# Patient Record
Sex: Female | Born: 1970 | Race: White | Hispanic: Yes | Marital: Married | Smoking: Never smoker
Health system: Southern US, Community
[De-identification: ages and names within clinical notes are randomized; demographics above are authoritative.]

## PROBLEM LIST (undated history)

## (undated) DIAGNOSIS — R011 Cardiac murmur, unspecified: Secondary | ICD-10-CM

## (undated) DIAGNOSIS — M5030 Other cervical disc degeneration, unspecified cervical region: Secondary | ICD-10-CM

## (undated) DIAGNOSIS — D219 Benign neoplasm of connective and other soft tissue, unspecified: Secondary | ICD-10-CM

## (undated) DIAGNOSIS — D649 Anemia, unspecified: Secondary | ICD-10-CM

## (undated) DIAGNOSIS — G4730 Sleep apnea, unspecified: Secondary | ICD-10-CM

## (undated) DIAGNOSIS — K219 Gastro-esophageal reflux disease without esophagitis: Secondary | ICD-10-CM

## (undated) DIAGNOSIS — Z803 Family history of malignant neoplasm of breast: Secondary | ICD-10-CM

## (undated) DIAGNOSIS — Z9889 Other specified postprocedural states: Secondary | ICD-10-CM

## (undated) DIAGNOSIS — T8859XA Other complications of anesthesia, initial encounter: Secondary | ICD-10-CM

## (undated) DIAGNOSIS — F419 Anxiety disorder, unspecified: Secondary | ICD-10-CM

## (undated) DIAGNOSIS — I340 Nonrheumatic mitral (valve) insufficiency: Secondary | ICD-10-CM

## (undated) DIAGNOSIS — R112 Nausea with vomiting, unspecified: Secondary | ICD-10-CM

## (undated) DIAGNOSIS — J189 Pneumonia, unspecified organism: Secondary | ICD-10-CM

## (undated) HISTORY — PX: ESOPHAGOGASTRODUODENOSCOPY ENDOSCOPY: SHX5814

## (undated) HISTORY — DX: Benign neoplasm of connective and other soft tissue, unspecified: D21.9

## (undated) HISTORY — DX: Other cervical disc degeneration, unspecified cervical region: M50.30

## (undated) HISTORY — PX: ABDOMINAL HYSTERECTOMY: SHX81

## (undated) HISTORY — PX: COLONOSCOPY: SHX174

## (undated) HISTORY — DX: Nonrheumatic mitral (valve) insufficiency: I34.0

## (undated) HISTORY — DX: Anxiety disorder, unspecified: F41.9

## (undated) HISTORY — DX: Anemia, unspecified: D64.9

## (undated) HISTORY — DX: Cardiac murmur, unspecified: R01.1

## (undated) HISTORY — PX: COLECTOMY: SHX59

## (undated) HISTORY — PX: COLOSTOMY: SHX63

## (undated) HISTORY — DX: Family history of malignant neoplasm of breast: Z80.3

---

## 2007-02-24 HISTORY — PX: TUBAL LIGATION: SHX77

## 2013-10-25 ENCOUNTER — Other Ambulatory Visit: Payer: Self-pay | Admitting: Family Medicine

## 2013-10-25 DIAGNOSIS — N6002 Solitary cyst of left breast: Secondary | ICD-10-CM

## 2013-11-02 ENCOUNTER — Other Ambulatory Visit: Payer: Self-pay

## 2013-11-17 ENCOUNTER — Other Ambulatory Visit: Payer: Self-pay

## 2013-12-01 ENCOUNTER — Other Ambulatory Visit: Payer: Self-pay

## 2013-12-04 ENCOUNTER — Other Ambulatory Visit: Payer: Self-pay | Admitting: Family Medicine

## 2013-12-04 DIAGNOSIS — N6002 Solitary cyst of left breast: Secondary | ICD-10-CM

## 2013-12-15 ENCOUNTER — Other Ambulatory Visit: Payer: Self-pay | Admitting: Family Medicine

## 2013-12-15 ENCOUNTER — Encounter (INDEPENDENT_AMBULATORY_CARE_PROVIDER_SITE_OTHER): Payer: Self-pay

## 2013-12-15 ENCOUNTER — Ambulatory Visit
Admission: RE | Admit: 2013-12-15 | Discharge: 2013-12-15 | Disposition: A | Discharge status: Home / Self Care | Payer: Commercial Indemnity | Source: Ambulatory Visit | Attending: Family Medicine | Admitting: Family Medicine

## 2013-12-15 DIAGNOSIS — N6002 Solitary cyst of left breast: Secondary | ICD-10-CM

## 2014-04-20 ENCOUNTER — Other Ambulatory Visit: Payer: Self-pay | Admitting: Obstetrics & Gynecology

## 2014-04-20 ENCOUNTER — Encounter: Payer: Self-pay | Admitting: Obstetrics & Gynecology

## 2014-04-20 ENCOUNTER — Ambulatory Visit (INDEPENDENT_AMBULATORY_CARE_PROVIDER_SITE_OTHER): Payer: Commercial Indemnity | Admitting: Obstetrics & Gynecology

## 2014-04-20 VITALS — BP 115/74 | HR 93 | Ht 66.0 in | Wt 142.0 lb

## 2014-04-20 DIAGNOSIS — N644 Mastodynia: Secondary | ICD-10-CM | POA: Diagnosis not present

## 2014-04-20 DIAGNOSIS — D259 Leiomyoma of uterus, unspecified: Secondary | ICD-10-CM

## 2014-04-20 DIAGNOSIS — Z1151 Encounter for screening for human papillomavirus (HPV): Secondary | ICD-10-CM

## 2014-04-20 DIAGNOSIS — Z01411 Encounter for gynecological examination (general) (routine) with abnormal findings: Secondary | ICD-10-CM

## 2014-04-20 DIAGNOSIS — Z01419 Encounter for gynecological examination (general) (routine) without abnormal findings: Secondary | ICD-10-CM

## 2014-04-20 DIAGNOSIS — N920 Excessive and frequent menstruation with regular cycle: Secondary | ICD-10-CM

## 2014-04-20 DIAGNOSIS — Z124 Encounter for screening for malignant neoplasm of cervix: Secondary | ICD-10-CM | POA: Diagnosis not present

## 2014-04-20 MED ORDER — TRANEXAMIC ACID 650 MG PO TABS: 1300.0000 mg | ORAL_TABLET | Freq: Three times a day (TID) | ORAL | Status: DC

## 2014-04-20 NOTE — Patient Instructions (Signed)
Preventive Care for Adults A healthy lifestyle and preventive care can promote health and wellness. Preventive health guidelines for women include the following key practices.  A routine yearly physical is a good way to check with your health care provider about your health and preventive screening. It is a chance to share any concerns and updates on your health and to receive a thorough exam.  Visit your dentist for a routine exam and preventive care every 6 months. Brush your teeth twice a day and floss once a day. Good oral hygiene prevents tooth decay and gum disease.  The frequency of eye exams is based on your age, health, family medical history, use of contact lenses, and other factors. Follow your health care provider's recommendations for frequency of eye exams.  Eat a healthy diet. Foods like vegetables, fruits, whole grains, low-fat dairy products, and lean protein foods contain the nutrients you need without too many calories. Decrease your intake of foods high in solid fats, added sugars, and salt. Eat the right amount of calories for you.Get information about a proper diet from your health care provider, if necessary.  Regular physical exercise is one of the most important things you can do for your health. Most adults should get at least 150 minutes of moderate-intensity exercise (any activity that increases your heart rate and causes you to sweat) each week. In addition, most adults need muscle-strengthening exercises on 2 or more days a week.  Maintain a healthy weight. The body mass index (BMI) is a screening tool to identify possible weight problems. It provides an estimate of body fat based on height and weight. Your health care provider can find your BMI and can help you achieve or maintain a healthy weight.For adults 20 years and older:  A BMI below 18.5 is considered underweight.  A BMI of 18.5 to 24.9 is normal.  A BMI of 25 to 29.9 is considered overweight.  A BMI of  30 and above is considered obese.  Maintain normal blood lipids and cholesterol levels by exercising and minimizing your intake of saturated fat. Eat a balanced diet with plenty of fruit and vegetables. Blood tests for lipids and cholesterol should begin at age 20 and be repeated every 5 years. If your lipid or cholesterol levels are high, you are over 50, or you are at high risk for heart disease, you may need your cholesterol levels checked more frequently.Ongoing high lipid and cholesterol levels should be treated with medicines if diet and exercise are not working.  If you smoke, find out from your health care provider how to quit. If you do not use tobacco, do not start.  Lung cancer screening is recommended for adults aged 55-80 years who are at high risk for developing lung cancer because of a history of smoking. A yearly low-dose CT scan of the lungs is recommended for people who have at least a 30-pack-year history of smoking and are a current smoker or have quit within the past 15 years. A pack year of smoking is smoking an average of 1 pack of cigarettes a day for 1 year (for example: 1 pack a day for 30 years or 2 packs a day for 15 years). Yearly screening should continue until the smoker has stopped smoking for at least 15 years. Yearly screening should be stopped for people who develop a health problem that would prevent them from having lung cancer treatment.  If you are pregnant, do not drink alcohol. If you are breastfeeding,   be very cautious about drinking alcohol. If you are not pregnant and choose to drink alcohol, do not have more than 1 drink per day. One drink is considered to be 12 ounces (355 mL) of beer, 5 ounces (148 mL) of wine, or 1.5 ounces (44 mL) of liquor.  Avoid use of street drugs. Do not share needles with anyone. Ask for help if you need support or instructions about stopping the use of drugs.  High blood pressure causes heart disease and increases the risk of  stroke. Your blood pressure should be checked at least every 1 to 2 years. Ongoing high blood pressure should be treated with medicines if weight loss and exercise do not work.  If you are 75-52 years old, ask your health care provider if you should take aspirin to prevent strokes.  Diabetes screening involves taking a blood sample to check your fasting blood sugar level. This should be done once every 3 years, after age 15, if you are within normal weight and without risk factors for diabetes. Testing should be considered at a younger age or be carried out more frequently if you are overweight and have at least 1 risk factor for diabetes.  Breast cancer screening is essential preventive care for women. You should practice "breast self-awareness." This means understanding the normal appearance and feel of your breasts and may include breast self-examination. Any changes detected, no matter how small, should be reported to a health care provider. Women in their 58s and 30s should have a clinical breast exam (CBE) by a health care provider as part of a regular health exam every 1 to 3 years. After age 16, women should have a CBE every year. Starting at age 53, women should consider having a mammogram (breast X-ray test) every year. Women who have a family history of breast cancer should talk to their health care provider about genetic screening. Women at a high risk of breast cancer should talk to their health care providers about having an MRI and a mammogram every year.  Breast cancer gene (BRCA)-related cancer risk assessment is recommended for women who have family members with BRCA-related cancers. BRCA-related cancers include breast, ovarian, tubal, and peritoneal cancers. Having family members with these cancers may be associated with an increased risk for harmful changes (mutations) in the breast cancer genes BRCA1 and BRCA2. Results of the assessment will determine the need for genetic counseling and  BRCA1 and BRCA2 testing.  Routine pelvic exams to screen for cancer are no longer recommended for nonpregnant women who are considered low risk for cancer of the pelvic organs (ovaries, uterus, and vagina) and who do not have symptoms. Ask your health care provider if a screening pelvic exam is right for you.  If you have had past treatment for cervical cancer or a condition that could lead to cancer, you need Pap tests and screening for cancer for at least 20 years after your treatment. If Pap tests have been discontinued, your risk factors (such as having a new sexual partner) need to be reassessed to determine if screening should be resumed. Some women have medical problems that increase the chance of getting cervical cancer. In these cases, your health care provider may recommend more frequent screening and Pap tests.  The HPV test is an additional test that may be used for cervical cancer screening. The HPV test looks for the virus that can cause the cell changes on the cervix. The cells collected during the Pap test can be  tested for HPV. The HPV test could be used to screen women aged 30 years and older, and should be used in women of any age who have unclear Pap test results. After the age of 30, women should have HPV testing at the same frequency as a Pap test.  Colorectal cancer can be detected and often prevented. Most routine colorectal cancer screening begins at the age of 50 years and continues through age 75 years. However, your health care provider may recommend screening at an earlier age if you have risk factors for colon cancer. On a yearly basis, your health care provider may provide home test kits to check for hidden blood in the stool. Use of a small camera at the end of a tube, to directly examine the colon (sigmoidoscopy or colonoscopy), can detect the earliest forms of colorectal cancer. Talk to your health care provider about this at age 50, when routine screening begins. Direct  exam of the colon should be repeated every 5-10 years through age 75 years, unless early forms of pre-cancerous polyps or small growths are found.  People who are at an increased risk for hepatitis B should be screened for this virus. You are considered at high risk for hepatitis B if:  You were born in a country where hepatitis B occurs often. Talk with your health care provider about which countries are considered high risk.  Your parents were born in a high-risk country and you have not received a shot to protect against hepatitis B (hepatitis B vaccine).  You have HIV or AIDS.  You use needles to inject street drugs.  You live with, or have sex with, someone who has hepatitis B.  You get hemodialysis treatment.  You take certain medicines for conditions like cancer, organ transplantation, and autoimmune conditions.  Hepatitis C blood testing is recommended for all people born from 1945 through 1965 and any individual with known risks for hepatitis C.  Practice safe sex. Use condoms and avoid high-risk sexual practices to reduce the spread of sexually transmitted infections (STIs). STIs include gonorrhea, chlamydia, syphilis, trichomonas, herpes, HPV, and human immunodeficiency virus (HIV). Herpes, HIV, and HPV are viral illnesses that have no cure. They can result in disability, cancer, and death.  You should be screened for sexually transmitted illnesses (STIs) including gonorrhea and chlamydia if:  You are sexually active and are younger than 24 years.  You are older than 24 years and your health care provider tells you that you are at risk for this type of infection.  Your sexual activity has changed since you were last screened and you are at an increased risk for chlamydia or gonorrhea. Ask your health care provider if you are at risk.  If you are at risk of being infected with HIV, it is recommended that you take a prescription medicine daily to prevent HIV infection. This is  called preexposure prophylaxis (PrEP). You are considered at risk if:  You are a heterosexual woman, are sexually active, and are at increased risk for HIV infection.  You take drugs by injection.  You are sexually active with a partner who has HIV.  Talk with your health care provider about whether you are at high risk of being infected with HIV. If you choose to begin PrEP, you should first be tested for HIV. You should then be tested every 3 months for as long as you are taking PrEP.  Osteoporosis is a disease in which the bones lose minerals and strength   with aging. This can result in serious bone fractures or breaks. The risk of osteoporosis can be identified using a bone density scan. Women ages 65 years and over and women at risk for fractures or osteoporosis should discuss screening with their health care providers. Ask your health care provider whether you should take a calcium supplement or vitamin D to reduce the rate of osteoporosis.  Menopause can be associated with physical symptoms and risks. Hormone replacement therapy is available to decrease symptoms and risks. You should talk to your health care provider about whether hormone replacement therapy is right for you.  Use sunscreen. Apply sunscreen liberally and repeatedly throughout the day. You should seek shade when your shadow is shorter than you. Protect yourself by wearing long sleeves, pants, a wide-brimmed hat, and sunglasses year round, whenever you are outdoors.  Once a month, do a whole body skin exam, using a mirror to look at the skin on your back. Tell your health care provider of new moles, moles that have irregular borders, moles that are larger than a pencil eraser, or moles that have changed in shape or color.  Stay current with required vaccines (immunizations).  Influenza vaccine. All adults should be immunized every year.  Tetanus, diphtheria, and acellular pertussis (Td, Tdap) vaccine. Pregnant women should  receive 1 dose of Tdap vaccine during each pregnancy. The dose should be obtained regardless of the length of time since the last dose. Immunization is preferred during the 27th-36th week of gestation. An adult who has not previously received Tdap or who does not know her vaccine status should receive 1 dose of Tdap. This initial dose should be followed by tetanus and diphtheria toxoids (Td) booster doses every 10 years. Adults with an unknown or incomplete history of completing a 3-dose immunization series with Td-containing vaccines should begin or complete a primary immunization series including a Tdap dose. Adults should receive a Td booster every 10 years.  Varicella vaccine. An adult without evidence of immunity to varicella should receive 2 doses or a second dose if she has previously received 1 dose. Pregnant females who do not have evidence of immunity should receive the first dose after pregnancy. This first dose should be obtained before leaving the health care facility. The second dose should be obtained 4-8 weeks after the first dose.  Human papillomavirus (HPV) vaccine. Females aged 13-26 years who have not received the vaccine previously should obtain the 3-dose series. The vaccine is not recommended for use in pregnant females. However, pregnancy testing is not needed before receiving a dose. If a female is found to be pregnant after receiving a dose, no treatment is needed. In that case, the remaining doses should be delayed until after the pregnancy. Immunization is recommended for any person with an immunocompromised condition through the age of 26 years if she did not get any or all doses earlier. During the 3-dose series, the second dose should be obtained 4-8 weeks after the first dose. The third dose should be obtained 24 weeks after the first dose and 16 weeks after the second dose.  Zoster vaccine. One dose is recommended for adults aged 60 years or older unless certain conditions are  present.  Measles, mumps, and rubella (MMR) vaccine. Adults born before 1957 generally are considered immune to measles and mumps. Adults born in 1957 or later should have 1 or more doses of MMR vaccine unless there is a contraindication to the vaccine or there is laboratory evidence of immunity to   each of the three diseases. A routine second dose of MMR vaccine should be obtained at least 28 days after the first dose for students attending postsecondary schools, health care workers, or international travelers. People who received inactivated measles vaccine or an unknown type of measles vaccine during 1963-1967 should receive 2 doses of MMR vaccine. People who received inactivated mumps vaccine or an unknown type of mumps vaccine before 1979 and are at high risk for mumps infection should consider immunization with 2 doses of MMR vaccine. For females of childbearing age, rubella immunity should be determined. If there is no evidence of immunity, females who are not pregnant should be vaccinated. If there is no evidence of immunity, females who are pregnant should delay immunization until after pregnancy. Unvaccinated health care workers born before 1957 who lack laboratory evidence of measles, mumps, or rubella immunity or laboratory confirmation of disease should consider measles and mumps immunization with 2 doses of MMR vaccine or rubella immunization with 1 dose of MMR vaccine.  Pneumococcal 13-valent conjugate (PCV13) vaccine. When indicated, a person who is uncertain of her immunization history and has no record of immunization should receive the PCV13 vaccine. An adult aged 19 years or older who has certain medical conditions and has not been previously immunized should receive 1 dose of PCV13 vaccine. This PCV13 should be followed with a dose of pneumococcal polysaccharide (PPSV23) vaccine. The PPSV23 vaccine dose should be obtained at least 8 weeks after the dose of PCV13 vaccine. An adult aged 19  years or older who has certain medical conditions and previously received 1 or more doses of PPSV23 vaccine should receive 1 dose of PCV13. The PCV13 vaccine dose should be obtained 1 or more years after the last PPSV23 vaccine dose.  Pneumococcal polysaccharide (PPSV23) vaccine. When PCV13 is also indicated, PCV13 should be obtained first. All adults aged 65 years and older should be immunized. An adult younger than age 65 years who has certain medical conditions should be immunized. Any person who resides in a nursing home or long-term care facility should be immunized. An adult smoker should be immunized. People with an immunocompromised condition and certain other conditions should receive both PCV13 and PPSV23 vaccines. People with human immunodeficiency virus (HIV) infection should be immunized as soon as possible after diagnosis. Immunization during chemotherapy or radiation therapy should be avoided. Routine use of PPSV23 vaccine is not recommended for American Indians, [place] Natives, or people younger than 65 years unless there are medical conditions that require PPSV23 vaccine. When indicated, people who have unknown immunization and have no record of immunization should receive PPSV23 vaccine. One-time revaccination 5 years after the first dose of PPSV23 is recommended for people aged 19-64 years who have chronic kidney failure, nephrotic syndrome, asplenia, or immunocompromised conditions. People who received 1-2 doses of PPSV23 before age 65 years should receive another dose of PPSV23 vaccine at age 65 years or later if at least 5 years have passed since the previous dose. Doses of PPSV23 are not needed for people immunized with PPSV23 at or after age 65 years.  Meningococcal vaccine. Adults with asplenia or persistent complement component deficiencies should receive 2 doses of quadrivalent meningococcal conjugate (MenACWY-D) vaccine. The doses should be obtained at least 2 months apart.  Microbiologists working with certain meningococcal bacteria, military recruits, people at risk during an outbreak, and people who travel to or live in countries with a high rate of meningitis should be immunized. A first-year college student up through age   21 years who is living in a residence hall should receive a dose if she did not receive a dose on or after her 16th birthday. Adults who have certain high-risk conditions should receive one or more doses of vaccine.  Hepatitis A vaccine. Adults who wish to be protected from this disease, have certain high-risk conditions, work with hepatitis A-infected animals, work in hepatitis A research labs, or travel to or work in countries with a high rate of hepatitis A should be immunized. Adults who were previously unvaccinated and who anticipate close contact with an international adoptee during the first 60 days after arrival in the [place] from a country with a high rate of hepatitis A should be immunized.  Hepatitis B vaccine. Adults who wish to be protected from this disease, have certain high-risk conditions, may be exposed to blood or other infectious body fluids, are household contacts or sex partners of hepatitis B positive people, are clients or workers in certain care facilities, or travel to or work in countries with a high rate of hepatitis B should be immunized.  Haemophilus influenzae type b (Hib) vaccine. A previously unvaccinated person with asplenia or sickle cell disease or having a scheduled splenectomy should receive 1 dose of Hib vaccine. Regardless of previous immunization, a recipient of a hematopoietic stem cell transplant should receive a 3-dose series 6-12 months after her successful transplant. Hib vaccine is not recommended for adults with HIV infection. Preventive Services / Frequency Ages 64 to 68 years  Blood pressure check.** / Every 1 to 2 years.  Lipid and cholesterol check.** / Every 5 years beginning at age  22.  Clinical breast exam.** / Every 3 years for women in their 88s and 53s.  BRCA-related cancer risk assessment.** / For women who have family members with a BRCA-related cancer (breast, ovarian, tubal, or peritoneal cancers).  Pap test.** / Every 2 years from ages 90 through 51. Every 3 years starting at age 21 through age 56 or 3 with a history of 3 consecutive normal Pap tests.  HPV screening.** / Every 3 years from ages 24 through ages 1 to 46 with a history of 3 consecutive normal Pap tests.  Hepatitis C blood test.** / For any individual with known risks for hepatitis C.  Skin self-exam. / Monthly.  Influenza vaccine. / Every year.  Tetanus, diphtheria, and acellular pertussis (Tdap, Td) vaccine.** / Consult your health care provider. Pregnant women should receive 1 dose of Tdap vaccine during each pregnancy. 1 dose of Td every 10 years.  Varicella vaccine.** / Consult your health care provider. Pregnant females who do not have evidence of immunity should receive the first dose after pregnancy.  HPV vaccine. / 3 doses over 6 months, if 72 and younger. The vaccine is not recommended for use in pregnant females. However, pregnancy testing is not needed before receiving a dose.  Measles, mumps, rubella (MMR) vaccine.** / You need at least 1 dose of MMR if you were born in 1957 or later. You may also need a 2nd dose. For females of childbearing age, rubella immunity should be determined. If there is no evidence of immunity, females who are not pregnant should be vaccinated. If there is no evidence of immunity, females who are pregnant should delay immunization until after pregnancy.  Pneumococcal 13-valent conjugate (PCV13) vaccine.** / Consult your health care provider.  Pneumococcal polysaccharide (PPSV23) vaccine.** / 1 to 2 doses if you smoke cigarettes or if you have certain conditions.  Meningococcal vaccine.** /  1 dose if you are age 19 to 21 years and a first-year college  student living in a residence hall, or have one of several medical conditions, you need to get vaccinated against meningococcal disease. You may also need additional booster doses.  Hepatitis A vaccine.** / Consult your health care provider.  Hepatitis B vaccine.** / Consult your health care provider.  Haemophilus influenzae type b (Hib) vaccine.** / Consult your health care provider. Ages 40 to 64 years  Blood pressure check.** / Every 1 to 2 years.  Lipid and cholesterol check.** / Every 5 years beginning at age 20 years.  Lung cancer screening. / Every year if you are aged 55-80 years and have a 30-pack-year history of smoking and currently smoke or have quit within the past 15 years. Yearly screening is stopped once you have quit smoking for at least 15 years or develop a health problem that would prevent you from having lung cancer treatment.  Clinical breast exam.** / Every year after age 40 years.  BRCA-related cancer risk assessment.** / For women who have family members with a BRCA-related cancer (breast, ovarian, tubal, or peritoneal cancers).  Mammogram.** / Every year beginning at age 40 years and continuing for as long as you are in good health. Consult with your health care provider.  Pap test.** / Every 3 years starting at age 30 years through age 65 or 70 years with a history of 3 consecutive normal Pap tests.  HPV screening.** / Every 3 years from ages 30 years through ages 65 to 70 years with a history of 3 consecutive normal Pap tests.  Fecal occult blood test (FOBT) of stool. / Every year beginning at age 50 years and continuing until age 75 years. You may not need to do this test if you get a colonoscopy every 10 years.  Flexible sigmoidoscopy or colonoscopy.** / Every 5 years for a flexible sigmoidoscopy or every 10 years for a colonoscopy beginning at age 50 years and continuing until age 75 years.  Hepatitis C blood test.** / For all people born from 1945 through  1965 and any individual with known risks for hepatitis C.  Skin self-exam. / Monthly.  Influenza vaccine. / Every year.  Tetanus, diphtheria, and acellular pertussis (Tdap/Td) vaccine.** / Consult your health care provider. Pregnant women should receive 1 dose of Tdap vaccine during each pregnancy. 1 dose of Td every 10 years.  Varicella vaccine.** / Consult your health care provider. Pregnant females who do not have evidence of immunity should receive the first dose after pregnancy.  Zoster vaccine.** / 1 dose for adults aged 60 years or older.  Measles, mumps, rubella (MMR) vaccine.** / You need at least 1 dose of MMR if you were born in 1957 or later. You may also need a 2nd dose. For females of childbearing age, rubella immunity should be determined. If there is no evidence of immunity, females who are not pregnant should be vaccinated. If there is no evidence of immunity, females who are pregnant should delay immunization until after pregnancy.  Pneumococcal 13-valent conjugate (PCV13) vaccine.** / Consult your health care provider.  Pneumococcal polysaccharide (PPSV23) vaccine.** / 1 to 2 doses if you smoke cigarettes or if you have certain conditions.  Meningococcal vaccine.** / Consult your health care provider.  Hepatitis A vaccine.** / Consult your health care provider.  Hepatitis B vaccine.** / Consult your health care provider.  Haemophilus influenzae type b (Hib) vaccine.** / Consult your health care provider. Ages 65   years and over  Blood pressure check.** / Every 1 to 2 years.  Lipid and cholesterol check.** / Every 5 years beginning at age 48 years.  Lung cancer screening. / Every year if you are aged 68-80 years and have a 30-pack-year history of smoking and currently smoke or have quit within the past 15 years. Yearly screening is stopped once you have quit smoking for at least 15 years or develop a health problem that would prevent you from having lung cancer  treatment.  Clinical breast exam.** / Every year after age 4 years.  BRCA-related cancer risk assessment.** / For women who have family members with a BRCA-related cancer (breast, ovarian, tubal, or peritoneal cancers).  Mammogram.** / Every year beginning at age 69 years and continuing for as long as you are in good health. Consult with your health care provider.  Pap test.** / Every 3 years starting at age 33 years through age 32 or 70 years with 3 consecutive normal Pap tests. Testing can be stopped between 65 and 70 years with 3 consecutive normal Pap tests and no abnormal Pap or HPV tests in the past 10 years.  HPV screening.** / Every 3 years from ages 32 years through ages 1 or 33 years with a history of 3 consecutive normal Pap tests. Testing can be stopped between 65 and 70 years with 3 consecutive normal Pap tests and no abnormal Pap or HPV tests in the past 10 years.  Fecal occult blood test (FOBT) of stool. / Every year beginning at age 77 years and continuing until age 27 years. You may not need to do this test if you get a colonoscopy every 10 years.  Flexible sigmoidoscopy or colonoscopy.** / Every 5 years for a flexible sigmoidoscopy or every 10 years for a colonoscopy beginning at age 61 years and continuing until age 68 years.  Hepatitis C blood test.** / For all people born from [birth year] through 1965 and any individual with known risks for hepatitis C.  Osteoporosis screening.** / A one-time screening for women ages 73 years and over and women at risk for fractures or osteoporosis.  Skin self-exam. / Monthly.  Influenza vaccine. / Every year.  Tetanus, diphtheria, and acellular pertussis (Tdap/Td) vaccine.** / 1 dose of Td every 10 years.  Varicella vaccine.** / Consult your health care provider.  Zoster vaccine.** / 1 dose for adults aged 67 years or older.  Pneumococcal 13-valent conjugate (PCV13) vaccine.** / Consult your health care provider.  Pneumococcal  polysaccharide (PPSV23) vaccine.** / 1 dose for all adults aged 43 years and older.  Meningococcal vaccine.** / Consult your health care provider.  Hepatitis A vaccine.** / Consult your health care provider.  Hepatitis B vaccine.** / Consult your health care provider.  Haemophilus influenzae type b (Hib) vaccine.** / Consult your health care provider. ** Family history and personal history of risk and conditions may change your health care provider's recommendations. Document Released: 04/07/2001 Document Revised: 06/26/2013 Document Reviewed: 07/07/2010 [facility] Patient Information 2015 [place], [place]. This information is not intended to replace advice given to you by your health care provider. Make sure you discuss any questions you have with your health care provider.  Thank you for enrolling in [facility]. Please follow the instructions below to securely access your online medical record. MyChart allows you to send messages to your doctor, view your test results, manage appointments, and more.   How Do I Sign Up? 1. In your [employer], go to [employer] and  enter [email or web address]. 2. Click on the Sign Up Now link in the Sign In box. You will see the New Member Sign Up page. 3. Enter your MyChart Access Code exactly as it appears below. You will not need to use this code after you've completed the sign-up process. If you do not sign up before the expiration date, you must request a new code.  MyChart Access Code: [email or web address] Expires: 06/19/2014  8:20 AM  4. Enter your Social Security Number ([email or web address]) and Date of Birth (mm/dd/yyyy) as indicated and click Submit. You will be taken to the next sign-up page. 5. Create a MyChart ID. This will be your MyChart login ID and cannot be changed, so think of one that is secure and easy to remember. 6. Create a MyChart password. You can change your password at any time. 7. Enter your Password Reset Question and Answer.  This can be used at a later time if you forget your password.  8. Enter your e-mail address. You will receive e-mail notification when new information is available in [place]. 9. Click Sign Up. You can now view your medical record.   Additional Information Remember, MyChart is NOT to be used for urgent needs. For medical emergencies, dial 911.

## 2014-04-20 NOTE — Progress Notes (Signed)
    GYNECOLOGY CLINIC ANNUAL PREVENTATIVE CARE ENCOUNTER NOTE  Subjective:     Joanna Yang is a 44 y.o. G78P2012 female here for a routine annual gynecologic exam.  Current complaints: left breast pain and lump, also menorrhagia and fibroids. Desires left breast ultrasound and pelvic ultrasound.     Gynecologic History Patient's last menstrual period was 04/10/2014. Contraception: none Last Pap: 06/20/2013. Results were: normal Last mammogram: 12/15/13. Results were: normal.  Had multiple diagnostic breast studies in the past for benign findings; she is very anxious about breast pathology.  No FH of breast cancer.  Obstetric History OB History  Gravida Para Term Preterm AB SAB TAB Ectopic Multiple Living  3 2 2  1 1    2     # Outcome Date GA Lbr Len/2nd Weight Sex Delivery Anes PTL Lv  3 Term 08/31/07   9 lb 7 oz (4.281 kg) M CS-LTranv   Y  2 Term 06/27/03   8 lb 14 oz (4.026 kg) M Vag-Spont   Y  1 SAB               History   Social History  . Marital Status: Married    Spouse Name: N/A  . Number of Children: N/A  . Years of Education: N/A   Occupational History  . Not on file.   Social History Main Topics  . Smoking status: Never Smoker   . Smokeless tobacco: Never Used  . Alcohol Use: 0.0 oz/week    0 Standard drinks or equivalent per week     Comment: rare  . Drug Use: No  . Sexual Activity:    Partners: Male    Birth Control/ Protection: Surgical   Other Topics Concern  . Not on file   Social History Narrative  . No narrative on file    The following portions of the patient's history were reviewed and updated as appropriate: allergies, current medications, past family history, past medical history, past social history, past surgical history and problem list.  Review of Systems Pertinent items are noted in HPI.   Objective:   BP 115/74 mmHg  Pulse 93  Ht 5\' 6"  (1.676 m)  Wt 142 lb (64.411 kg)  BMI 22.93 kg/m2  LMP 04/10/2014 GENERAL:  Well-developed, well-nourished female in no acute distress.  HEENT: Normocephalic, atraumatic. Sclerae anicteric.  NECK: Supple. Normal thyroid.  LUNGS: Clear to auscultation bilaterally.  HEART: Regular rate and rhythm. BREASTS: Symmetric in size. No abnormal masses, skin changes, nipple drainage, or lymphadenopathy. +Tenderness on palpation of left breast. ABDOMEN: Soft, nontender, nondistended. No organomegaly. PELVIC: Normal external female genitalia. Vagina is pink and rugated.  Normal discharge. Normal cervix contour. Pap smear obtained. Uterus is enlarged about 14 week size. No adnexal mass or tenderness.  EXTREMITIES: No cyanosis, clubbing, or edema, 2+ distal pulses.   Assessment:   Annual gynecologic examination Left breast pain Menorrhagia Reported history of fibroids   Plan:   Pap done, will follow up results and manage accordingly. Mammogram is up to date; but ultrasound ordered as per patient request. Lysteda ordered prn menorrhagia (patient wants to avoid hormones). Will follow up ultrasound. Routine preventative health maintenance measures emphasized   Verita Schneiders, MD, [facility]

## 2014-04-23 ENCOUNTER — Other Ambulatory Visit: Payer: Commercial Indemnity

## 2014-04-24 LAB — CYTOLOGY - PAP

## 2014-04-25 ENCOUNTER — Encounter: Payer: Self-pay | Admitting: Obstetrics & Gynecology

## 2014-05-03 ENCOUNTER — Encounter: Payer: Self-pay | Admitting: Obstetrics & Gynecology

## 2014-05-04 ENCOUNTER — Ambulatory Visit (HOSPITAL_COMMUNITY): Payer: Commercial Indemnity

## 2014-06-21 ENCOUNTER — Encounter: Payer: Self-pay | Admitting: Obstetrics & Gynecology

## 2014-09-03 ENCOUNTER — Encounter: Payer: Self-pay | Admitting: *Deleted

## 2014-09-03 DIAGNOSIS — N6002 Solitary cyst of left breast: Secondary | ICD-10-CM

## 2014-11-30 ENCOUNTER — Ambulatory Visit (HOSPITAL_COMMUNITY)
Admission: RE | Admit: 2014-11-30 | Discharge: 2014-11-30 | Disposition: A | Discharge status: Home / Self Care | Payer: Commercial Indemnity | Source: Ambulatory Visit | Attending: Obstetrics & Gynecology | Admitting: Obstetrics & Gynecology

## 2014-11-30 DIAGNOSIS — D259 Leiomyoma of uterus, unspecified: Secondary | ICD-10-CM

## 2014-11-30 DIAGNOSIS — N920 Excessive and frequent menstruation with regular cycle: Secondary | ICD-10-CM | POA: Diagnosis not present

## 2014-11-30 DIAGNOSIS — D649 Anemia, unspecified: Secondary | ICD-10-CM | POA: Insufficient documentation

## 2015-03-08 ENCOUNTER — Telehealth: Payer: Self-pay | Admitting: Genetic Counselor

## 2015-03-08 NOTE — Telephone Encounter (Signed)
Had a long conversation about genetic testing.  Discussed that patient does not have a family history of cancer and therefore her insurance would not cover testing.  Discussed out of pocket options. Also discussed that testing may not give the reassuance she is looking for, if she is negative it does not mean that she will never develop cancer and she could test positive for a VUS which may increase her anxiety.  She will think about testing and call back if she wants to come in.

## 2015-03-22 ENCOUNTER — Telehealth: Payer: Self-pay | Admitting: Genetic Counselor

## 2015-03-22 NOTE — Telephone Encounter (Signed)
Pt spoke with Florene Glen and needs time to think about being tested.  Referring office called to follow up on referral .

## 2015-09-20 ENCOUNTER — Encounter: Payer: Self-pay | Admitting: Obstetrics and Gynecology

## 2015-09-20 ENCOUNTER — Ambulatory Visit (INDEPENDENT_AMBULATORY_CARE_PROVIDER_SITE_OTHER): Payer: Managed Care, Other (non HMO) | Admitting: Obstetrics and Gynecology

## 2015-09-20 VITALS — BP 100/64 | HR 84 | Resp 16 | Ht 65.5 in | Wt 143.0 lb

## 2015-09-20 DIAGNOSIS — N6452 Nipple discharge: Secondary | ICD-10-CM | POA: Diagnosis not present

## 2015-09-20 DIAGNOSIS — R5383 Other fatigue: Secondary | ICD-10-CM

## 2015-09-20 DIAGNOSIS — Z01419 Encounter for gynecological examination (general) (routine) without abnormal findings: Secondary | ICD-10-CM

## 2015-09-20 DIAGNOSIS — F411 Generalized anxiety disorder: Secondary | ICD-10-CM

## 2015-09-20 LAB — COMPREHENSIVE METABOLIC PANEL
ALK PHOS: 35 U/L (ref 33–115)
ALT: 12 U/L (ref 6–29)
AST: 13 U/L (ref 10–30)
Albumin: 4.5 g/dL (ref 3.6–5.1)
BILIRUBIN TOTAL: 0.6 mg/dL (ref 0.2–1.2)
BUN: 11 mg/dL (ref 7–25)
CALCIUM: 9.4 mg/dL (ref 8.6–10.2)
CO2: 23 mmol/L (ref 20–31)
Chloride: 102 mmol/L (ref 98–110)
Creat: 0.74 mg/dL (ref 0.50–1.10)
Glucose, Bld: 91 mg/dL (ref 65–99)
POTASSIUM: 4.4 mmol/L (ref 3.5–5.3)
Sodium: 136 mmol/L (ref 135–146)
TOTAL PROTEIN: 7 g/dL (ref 6.1–8.1)

## 2015-09-20 LAB — CBC
HEMATOCRIT: 43 % (ref 35.0–45.0)
HEMOGLOBIN: 14.2 g/dL (ref 11.7–15.5)
MCH: 29.5 pg (ref 27.0–33.0)
MCHC: 33 g/dL (ref 32.0–36.0)
MCV: 89.2 fL (ref 80.0–100.0)
MPV: 10.4 fL (ref 7.5–12.5)
Platelets: 225 10*3/uL (ref 140–400)
RBC: 4.82 MIL/uL (ref 3.80–5.10)
RDW: 13.3 % (ref 11.0–15.0)
WBC: 5 10*3/uL (ref 3.8–10.8)

## 2015-09-20 LAB — TSH: TSH: 1.52 mIU/L

## 2015-09-20 NOTE — Patient Instructions (Addendum)
Health Maintenance, Female Adopting a healthy lifestyle and getting preventive care can go a long way to promote health and wellness. Talk with your health care provider about what schedule of regular examinations is right for you. This is a good chance for you to check in with your provider about disease prevention and staying healthy. In between checkups, there are plenty of things you can do on your own. Experts have done a lot of research about which lifestyle changes and preventive measures are most likely to keep you healthy. Ask your health care provider for more information. WEIGHT AND DIET  Eat a healthy diet  Be sure to include plenty of vegetables, fruits, low-fat dairy products, and lean protein.  Do not eat a lot of foods high in solid fats, added sugars, or salt.  Get regular exercise. This is one of the most important things you can do for your health.  Most adults should exercise for at least 150 minutes each week. The exercise should increase your heart rate and make you sweat (moderate-intensity exercise).  Most adults should also do strengthening exercises at least twice a week. This is in addition to the moderate-intensity exercise.  Maintain a healthy weight  Body mass index (BMI) is a measurement that can be used to identify possible weight problems. It estimates body fat based on height and weight. Your health care provider can help determine your BMI and help you achieve or maintain a healthy weight.  For females 20 years of age and older:   A BMI below 18.5 is considered underweight.  A BMI of 18.5 to 24.9 is normal.  A BMI of 25 to 29.9 is considered overweight.  A BMI of 30 and above is considered obese.  Watch levels of cholesterol and blood lipids  You should start having your blood tested for lipids and cholesterol at 44 years of age, then have this test every 5 years.  You may need to have your cholesterol levels checked more often if:  Your lipid  or cholesterol levels are high.  You are older than 44 years of age.  You are at high risk for heart disease.  CANCER SCREENING   Lung Cancer  Lung cancer screening is recommended for adults 55-80 years old who are at high risk for lung cancer because of a history of smoking.  A yearly low-dose CT scan of the lungs is recommended for people who:  Currently smoke.  Have quit within the past 15 years.  Have at least a 30-pack-year history of smoking. A pack year is smoking an average of one pack of cigarettes a day for 1 year.  Yearly screening should continue until it has been 15 years since you quit.  Yearly screening should stop if you develop a health problem that would prevent you from having lung cancer treatment.  Breast Cancer  Practice breast self-awareness. This means understanding how your breasts normally appear and feel.  It also means doing regular breast self-exams. Let your health care provider know about any changes, no matter how small.  If you are in your 20s or 30s, you should have a clinical breast exam (CBE) by a health care provider every 1-3 years as part of a regular health exam.  If you are 40 or older, have a CBE every year. Also consider having a breast X-ray (mammogram) every year.  If you have a family history of breast cancer, talk to your health care provider about genetic screening.  If you   are at high risk for breast cancer, talk to your health care provider about having an MRI and a mammogram every year.  Breast cancer gene (BRCA) assessment is recommended for women who have family members with BRCA-related cancers. BRCA-related cancers include:  Breast.  Ovarian.  Tubal.  Peritoneal cancers.  Results of the assessment will determine the need for genetic counseling and BRCA1 and BRCA2 testing. Cervical Cancer Your health care provider may recommend that you be screened regularly for cancer of the pelvic organs (ovaries, uterus, and  vagina). This screening involves a pelvic examination, including checking for microscopic changes to the surface of your cervix (Pap test). You may be encouraged to have this screening done every 3 years, beginning at age 21.  For women ages 30-65, health care providers may recommend pelvic exams and Pap testing every 3 years, or they may recommend the Pap and pelvic exam, combined with testing for human papilloma virus (HPV), every 5 years. Some types of HPV increase your risk of cervical cancer. Testing for HPV may also be done on women of any age with unclear Pap test results.  Other health care providers may not recommend any screening for nonpregnant women who are considered low risk for pelvic cancer and who do not have symptoms. Ask your health care provider if a screening pelvic exam is right for you.  If you have had past treatment for cervical cancer or a condition that could lead to cancer, you need Pap tests and screening for cancer for at least 20 years after your treatment. If Pap tests have been discontinued, your risk factors (such as having a new sexual partner) need to be reassessed to determine if screening should resume. Some women have medical problems that increase the chance of getting cervical cancer. In these cases, your health care provider may recommend more frequent screening and Pap tests. Colorectal Cancer  This type of cancer can be detected and often prevented.  Routine colorectal cancer screening usually begins at 44 years of age and continues through 44 years of age.  Your health care provider may recommend screening at an earlier age if you have risk factors for colon cancer.  Your health care provider may also recommend using home test kits to check for hidden blood in the stool.  A small camera at the end of a tube can be used to examine your colon directly (sigmoidoscopy or colonoscopy). This is done to check for the earliest forms of colorectal  cancer.  Routine screening usually begins at age 50.  Direct examination of the colon should be repeated every 5-10 years through 44 years of age. However, you may need to be screened more often if early forms of precancerous polyps or small growths are found. Skin Cancer  Check your skin from head to toe regularly.  Tell your health care provider about any new moles or changes in moles, especially if there is a change in a mole's shape or color.  Also tell your health care provider if you have a mole that is larger than the size of a pencil eraser.  Always use sunscreen. Apply sunscreen liberally and repeatedly throughout the day.  Protect yourself by wearing long sleeves, pants, a wide-brimmed hat, and sunglasses whenever you are outside. HEART DISEASE, DIABETES, AND HIGH BLOOD PRESSURE   High blood pressure causes heart disease and increases the risk of stroke. High blood pressure is more likely to develop in:  People who have blood pressure in the high end   of the normal range (130-139/85-89 mm Hg).  People who are overweight or obese.  People who are African American.  If you are 38-23 years of age, have your blood pressure checked every 3-5 years. If you are 61 years of age or older, have your blood pressure checked every year. You should have your blood pressure measured twice--once when you are at a hospital or clinic, and once when you are not at a hospital or clinic. Record the average of the two measurements. To check your blood pressure when you are not at a hospital or clinic, you can use:  An automated blood pressure machine at a pharmacy.  A home blood pressure monitor.  If you are between 45 years and 39 years old, ask your health care provider if you should take aspirin to prevent strokes.  Have regular diabetes screenings. This involves taking a blood sample to check your fasting blood sugar level.  If you are at a normal weight and have a low risk for diabetes,  have this test once every three years after 44 years of age.  If you are overweight and have a high risk for diabetes, consider being tested at a younger age or more often. PREVENTING INFECTION  Hepatitis B  If you have a higher risk for hepatitis B, you should be screened for this virus. You are considered at high risk for hepatitis B if:  You were born in a country where hepatitis B is common. Ask your health care provider which countries are considered high risk.  Your parents were born in a high-risk country, and you have not been immunized against hepatitis B (hepatitis B vaccine).  You have HIV or AIDS.  You use needles to inject street drugs.  You live with someone who has hepatitis B.  You have had sex with someone who has hepatitis B.  You get hemodialysis treatment.  You take certain medicines for conditions, including cancer, organ transplantation, and autoimmune conditions. Hepatitis C  Blood testing is recommended for:  Everyone born from [birth year] through 1964.  Anyone with known risk factors for hepatitis C. Sexually transmitted infections (STIs)  You should be screened for sexually transmitted infections (STIs) including gonorrhea and chlamydia if:  You are sexually active and are younger than 44 years of age.  You are older than 44 years of age and your health care provider tells you that you are at risk for this type of infection.  Your sexual activity has changed since you were last screened and you are at an increased risk for chlamydia or gonorrhea. Ask your health care provider if you are at risk.  If you do not have HIV, but are at risk, it may be recommended that you take a prescription medicine daily to prevent HIV infection. This is called pre-exposure prophylaxis (PrEP). You are considered at risk if:  You are sexually active and do not regularly use condoms or know the HIV status of your partner(s).  You take drugs by injection.  You are sexually  active with a partner who has HIV. Talk with your health care provider about whether you are at high risk of being infected with HIV. If you choose to begin PrEP, you should first be tested for HIV. You should then be tested every 3 months for as long as you are taking PrEP.  PREGNANCY   If you are premenopausal and you may become pregnant, ask your health care provider about preconception counseling.  If you may  become pregnant, take 400 to 800 micrograms (mcg) of folic acid every day.  If you want to prevent pregnancy, talk to your health care provider about birth control (contraception). OSTEOPOROSIS AND MENOPAUSE   Osteoporosis is a disease in which the bones lose minerals and strength with aging. This can result in serious bone fractures. Your risk for osteoporosis can be identified using a bone density scan.  If you are 42 years of age or older, or if you are at risk for osteoporosis and fractures, ask your health care provider if you should be screened.  Ask your health care provider whether you should take a calcium or vitamin D supplement to lower your risk for osteoporosis.  Menopause may have certain physical symptoms and risks.  Hormone replacement therapy may reduce some of these symptoms and risks. Talk to your health care provider about whether hormone replacement therapy is right for you.  HOME CARE INSTRUCTIONS   Schedule regular health, dental, and eye exams.  Stay current with your immunizations.   Do not use any tobacco products including cigarettes, chewing tobacco, or electronic cigarettes.  If you are pregnant, do not drink alcohol.  If you are breastfeeding, limit how much and how often you drink alcohol.  Limit alcohol intake to no more than 1 drink per day for nonpregnant women. One drink equals 12 ounces of beer, 5 ounces of wine, or 1 ounces of hard liquor.  Do not use street drugs.  Do not share needles.  Ask your health care provider for help if  you need support or information about quitting drugs.  Tell your health care provider if you often feel depressed.  Tell your health care provider if you have ever been abused or do not feel safe at home.   This information is not intended to replace advice given to you by your health care provider. Make sure you discuss any questions you have with your health care provider.   Document Released: 08/25/2010 Document Revised: 03/02/2014 Document Reviewed: 01/11/2013 Elsevier Interactive Patient Education 2016 [place] NAME OF THE EXAM TO DETECT RETROAREOLAR MASSES IS CALLED A DUCTOGRAM.

## 2015-09-20 NOTE — Progress Notes (Signed)
45 y.o. G41P2012 Married [place] female here for annual exam.    States she feels anxious about her care and her life in general.  Lost a young friend to breast cancer. Does not have a counselor here is [place].  Had one previously in [place].  Wants her anxiety addressed.  Concerned with her left breast for a long time.  Wants to know why the left breast is always so tender. Doing mammograms since age 54. Has breast pain.  Having left nipple discharge. Feels like she is almost breast feeding from the left side only.  Has not done breast feeding for many years.) Has a drop of fluid - color is clear.  No blood.   States she questions if she had discharge or not.  Left breast is also lumpy.  Saw a Psychologist, sport and exercise in 2012 in [place] for a left breast lump.  No biopsy or excision performed.  Has recommendations for yearly mammograms. Not happy about doing mammograms as she is concerned about radiation.  Mammograms are normal per patient but the ultrasound usually sees something, which is documented as a lymph node. Happy with care at [facility] here in [place].  Menses are every 23 days and are heavy for 2 - 4th day.  Last 7 - 8 days. Declines birth control pills.  Did not take Lysteda. Takes iron due to hx of anemia.  Had a CBC 3 months ago which was normal. Had a normal pelvic ultrasound done 11/30/14 for menorrhagia and anemia.  From [place], [place]. 2 sons.  Moved from [place] 2 years ago.   PCP: Eloise Levels, NP [facility] @ [place])  Patient's last menstrual period was 09/07/2015 (exact date).           Sexually active: Yes.   female The current method of family planning is tubal ligation.    Exercising: No.   Smoker:  no  Health Maintenance: Pap:  04-20-14 Neg:Neg HR HPV History of abnormal Pap:  no MMG:  05-02-15 3D Diag.bil & US/Density c/Neg--tiny intramammary lymph nodes/BiRads2:[facility] Colonoscopy:  2011 inflammation in [place];had sigmoidoscopy 6 months later and  negative. BMD:   n/a  Result  n/a TDaP:  Within last 10 years Gardasil:   N/A   Screening Labs:    Urine today: unable to void   reports that she has never smoked. She has never used smokeless tobacco. She reports that she does not drink alcohol or use drugs.  Past Medical History:  Diagnosis Date  . Anemia   . Anxiety   . Diabetes mellitus without complication ([place])   . Fibroid   . Heart murmur    pt. thinks she has a benign murmur dx'd during pregnancy    Past Surgical History:  Procedure Laterality Date  . CESAREAN SECTION  2009  . TUBAL LIGATION  2009    Current Outpatient Prescriptions  Medication Sig Dispense Refill  . ferrous fumarate (HEMOCYTE - 106 MG FE) 325 (106 FE) MG TABS tablet Take 1 tablet by mouth.    . polyethylene glycol (MIRALAX / GLYCOLAX) packet Take 17 g by mouth daily. Takes 1/2 capful daily     No current facility-administered medications for this visit.     Family History  Problem Relation Age of Onset  . Hypertension Mother   . Arrhythmia Mother   . Depression Mother   . Hypertension Father   . Cancer Paternal Grandmother     tumor in spine  . Dementia Paternal Grandfather     ROS:  Pertinent items are noted in HPI.  Otherwise, a comprehensive ROS was negative.  Exam:   BP 100/64 (BP Location: Right Arm, Patient Position: Sitting, Cuff Size: Normal)   Pulse 84   Resp 16   Ht 5' 5.5" (1.664 m)   Wt 143 lb (64.9 kg)   LMP 09/07/2015 (Exact Date)   BMI 23.43 kg/m     General appearance: alert, cooperative and appears stated age Head: Normocephalic, without obvious abnormality, atraumatic Neck: no adenopathy, supple, symmetrical, trachea midline and thyroid normal to inspection and palpation Lungs: clear to auscultation bilaterally Breasts: normal appearance, no masses or tenderness, No nipple retraction or dimpling, No nipple discharge or bleeding, No axillary or supraclavicular adenopathy Heart: regular rate and rhythm Abdomen:  soft, non-tender; no masses, no organomegaly Extremities: extremities normal, atraumatic, no cyanosis or edema Skin: Skin color, texture, turgor normal. No rashes or lesions Lymph nodes: Cervical, supraclavicular, and axillary nodes normal. No abnormal inguinal nodes palpated Neurologic: Grossly normal  Pelvic: External genitalia:  no lesions              Urethra:  normal appearing urethra with no masses, tenderness or lesions              Bartholins and Skenes: normal                 Vagina: normal appearing vagina with normal color and discharge, no lesions              Cervix: no lesions              Pap taken: no. Bimanual Exam:  Uterus:  normal size, contour, position, consistency, mobility, non-tender              Adnexa: no mass, fullness, tenderness              Rectal exam: Yes.  .  Confirms.              Anus:  normal sphincter tone, no lesions  Chaperone was present for exam.  Assessment:   Well woman visit with normal exam. Status post BTL.  Hx left nipple discharge.  Fatigue.  Anxiety.   Plan: Yearly mammogram recommended after age 48.  Recommended self breast exam.  Pap and HR HPV as above. Guidelines for Calcium, Vitamin D, regular exercise program including cardiovascular and weight bearing exercise. Labs - TSH and prolactin.  Vit D, Vit B12, CBC, CMP.  We discussed mammograms, ultrasounds, and ductogram study in detail.  We discussed anxiety and I have given patient a brochure for [place] counseling.   Patient may benefit from medication to treat anxiety.   Follow up yearly and prn.    Follow up annually and prn.   Additional counseling given.  Yes.  . _30______ minutes face to face time of which over 50% was spent in counseling regarding her breast care, nipple discharge, fatigue, and her anxiety.  This was a definite problem visit in addition to an annual examination.    After visit summary provided.

## 2015-09-21 LAB — PROLACTIN: Prolactin: 7.4 ng/mL

## 2015-09-21 LAB — VITAMIN D 25 HYDROXY (VIT D DEFICIENCY, FRACTURES): Vit D, 25-Hydroxy: 31 ng/mL (ref 30–100)

## 2015-09-21 LAB — VITAMIN B1

## 2015-09-23 LAB — VITAMIN B12: VITAMIN B 12: 425 pg/mL (ref 200–1100)

## 2015-09-24 ENCOUNTER — Telehealth: Payer: Self-pay | Admitting: Emergency Medicine

## 2015-09-24 NOTE — Telephone Encounter (Signed)
-----   Message from Nunzio Cobbs, MD sent at 09/23/2015 11:09 AM EDT ----- Please report normal blood work to patient.  This includes vitamin D, blood counts, blood chemistries, prolactin, and thyroid.  Her vitamin B12 is still pending.   She is having left nipple discharge. I am recommending a ductogram for her at [facility].  We have already discussed this.  She had a diagnostic mammogram and ultrasound there in March 2017. This showed benign lymph nodes.  Thank you.   Cc- Marisa Sprinkles

## 2015-09-25 NOTE — Telephone Encounter (Signed)
Late entry for 1100 09/24/15  Spoke with patient and she is given all normal results.   Discussed order for ductogram from Dr. [name].  Patient states that nipple discharge occurred one time and she is "really keeping a close eye" for any further discharge and she will call if it occurs again. She declines at this time to schedule. She is advised of Dr. Elza Rafter recommendation again and she agrees but would like to continue to monitor and return call if any recurring symptoms.   Routing to Dr. [name] to review.

## 2015-09-25 NOTE — Telephone Encounter (Signed)
Patient choice about the ductogram.  We had a clear discussion about this and she did question herself as the the extent of the nipple discharge in the first place.  If recurs, I would recommend proceeding forward with this study.

## 2015-09-25 NOTE — Telephone Encounter (Signed)
Noted message from Dr. Silva.  Will close encounter.   

## 2015-09-27 ENCOUNTER — Telehealth: Payer: Self-pay

## 2015-09-27 NOTE — Telephone Encounter (Signed)
-----   Message from Nunzio Cobbs, MD sent at 09/25/2015  8:11 PM EDT ----- Please report normal vit B12 to patient.

## 2015-09-27 NOTE — Telephone Encounter (Addendum)
Spoke with patient. Advised of message as seen below from [place]. She is agreeable and verbalizes understanding. Patient requests that her lab results be faxed to her PCP Eloise Levels, NP. Verbal request for release of medical records form completed and to the front desk for processing.  Routing to provider for final review. Patient agreeable to disposition. Will close encounter.

## 2016-01-06 ENCOUNTER — Telehealth: Payer: Self-pay | Admitting: *Deleted

## 2016-01-06 ENCOUNTER — Telehealth: Payer: Self-pay | Admitting: Obstetrics and Gynecology

## 2016-01-06 NOTE — Telephone Encounter (Signed)
Spoke with patient. Patient states that she has been having increased bloating. Patient is worried about her ovaries. Denies any pain, bleeding, nausea, vomiting, or change in BM. Patient is scheduled to see her GI doctor, but would like to be seen in our office for further evaluation as well. Asking if she will need an ultrasound. Advised she will be assessed in the office and any needed testing will be ordered. Patient also has a form for insurance that needs to be completed. Advised her to bring form to her appointment so that we can see if this is something we can complete. States she needs lipid panel performed. Aware this needs to be done fasting. Appointment scheduled for 01/10/2016 at 10 am with Melvia Heaps CNM. Patient is agreeable to date, time, and to see another provider.  Cc: Dr.Silva  Routing to covering provider for final review. Patient agreeable to disposition. Will close encounter.

## 2016-01-06 NOTE — Telephone Encounter (Signed)
Spoke with patient. Patient spoke with Verline Lema earlier to schedule appt, see telephone encounter also dated 01/06/16. Patient states she wants to change appt to earlier date if available, offered patient appt for 01/09/16 at 10am with Melvia Heaps, CNM. Patient states she will need to review with husband and return call. Advised patient  no appt time changed at this time, will await return call. Patient is agreeable.

## 2016-01-06 NOTE — Telephone Encounter (Signed)
Patient would like to speak with nurse about scheduling an ultrasound.

## 2016-01-08 NOTE — Telephone Encounter (Signed)
Spoke with patient. Patient will keep current appt date and time for 01/10/16 at 10am with Melvia Heaps, CNM.   Routing to provider for final review. Patient is agreeable to disposition. Will close encounter.

## 2016-01-08 NOTE — Telephone Encounter (Signed)
Left message to call Jennylee Uehara at [phone].  

## 2016-01-10 ENCOUNTER — Encounter: Payer: Self-pay | Admitting: Certified Nurse Midwife

## 2016-01-10 ENCOUNTER — Ambulatory Visit (INDEPENDENT_AMBULATORY_CARE_PROVIDER_SITE_OTHER): Payer: Managed Care, Other (non HMO) | Admitting: Certified Nurse Midwife

## 2016-01-10 VITALS — BP 98/70 | HR 70 | Resp 16 | Ht 65.5 in | Wt 143.0 lb

## 2016-01-10 DIAGNOSIS — N6320 Unspecified lump in the left breast, unspecified quadrant: Secondary | ICD-10-CM

## 2016-01-10 DIAGNOSIS — E739 Lactose intolerance, unspecified: Secondary | ICD-10-CM | POA: Diagnosis not present

## 2016-01-10 DIAGNOSIS — K5904 Chronic idiopathic constipation: Secondary | ICD-10-CM | POA: Diagnosis not present

## 2016-01-10 DIAGNOSIS — Z0000 Encounter for general adult medical examination without abnormal findings: Secondary | ICD-10-CM

## 2016-01-10 DIAGNOSIS — R350 Frequency of micturition: Secondary | ICD-10-CM | POA: Diagnosis not present

## 2016-01-10 DIAGNOSIS — M25559 Pain in unspecified hip: Secondary | ICD-10-CM

## 2016-01-10 DIAGNOSIS — R140 Abdominal distension (gaseous): Secondary | ICD-10-CM

## 2016-01-10 LAB — POCT URINALYSIS DIPSTICK
Bilirubin, UA: NEGATIVE
Blood, UA: NEGATIVE
Glucose, UA: NEGATIVE
Ketones, UA: NEGATIVE
LEUKOCYTES UA: NEGATIVE
NITRITE UA: NEGATIVE
PH UA: 5
Protein, UA: NEGATIVE
Urobilinogen, UA: NEGATIVE

## 2016-01-10 LAB — COMPREHENSIVE METABOLIC PANEL
ALBUMIN: 4.4 g/dL (ref 3.6–5.1)
ALK PHOS: 39 U/L (ref 33–115)
ALT: 12 U/L (ref 6–29)
AST: 13 U/L (ref 10–30)
BUN: 10 mg/dL (ref 7–25)
CO2: 27 mmol/L (ref 20–31)
Calcium: 9.2 mg/dL (ref 8.6–10.2)
Chloride: 105 mmol/L (ref 98–110)
Creat: 0.72 mg/dL (ref 0.50–1.10)
Glucose, Bld: 79 mg/dL (ref 65–99)
POTASSIUM: 4.1 mmol/L (ref 3.5–5.3)
Sodium: 141 mmol/L (ref 135–146)
TOTAL PROTEIN: 6.6 g/dL (ref 6.1–8.1)
Total Bilirubin: 0.5 mg/dL (ref 0.2–1.2)

## 2016-01-10 LAB — LIPID PANEL
CHOLESTEROL: 186 mg/dL (ref <200)
HDL: 55 mg/dL (ref >50)
LDL CALC: 108 mg/dL — AB (ref <100)
Total CHOL/HDL Ratio: 3.4 Ratio (ref <5.0)
Triglycerides: 116 mg/dL (ref <150)
VLDL: 23 mg/dL (ref <30)

## 2016-01-10 NOTE — Progress Notes (Signed)
Subjective:     Patient ID: Joanna Yang, female   DOB: [birth year], 45 y.o.   MRN: [id number]  HPI 45 yo g3p2012 here with complaint of constipation with only small stool light color with mucous. Denies blood in stool or black tarry stools. Long history of constipation and use of Miralax. Had a CAT scan in 2008 had narrowing of part of the colon. History of colitis while living in [place]. Took Miralax and only had two small stools. Passing large amount of gas. Continues to have bloating and has working on no lactose diet. Drinks minimal fluids each day due to bloating concern. Patient is worried about ovarian cancer due to continued GI symptoms and feels this maybe the cause. Has had pus 11/30/14 which was normal. She worries because she needs to raise her children. Spouse here with patient. Patient agreeable to all conversation and management discussion and exam spouse to be present..Patient's periods are normal, no concerns. She also is concerned with back pain off and on, but not occurring today. Here for labs for health insurance also. No other concerns today.  Review of Systems  Constitutional: Positive for fatigue.  Gastrointestinal: Positive for abdominal distention and constipation.  Skin: Negative.        Color good, no apparent distress   Neurological: Negative.        Objective:   Physical Exam  Constitutional: She is oriented to person, place, and time. She appears well-developed and well-nourished.  Cardiovascular: Normal rate and regular rhythm.   Pulmonary/Chest: Effort normal. She has no wheezes. She has no rales.  Abdominal: Soft. Bowel sounds are normal. She exhibits no distension and no mass. There is no tenderness. There is no rebound and no guarding.  Genitourinary: Vagina normal and uterus normal. Cervix exhibits discharge. Cervix exhibits no motion tenderness. Right adnexum displays no mass and no fullness. Left adnexum displays no mass, no tenderness and no fullness.   Neurological: She is alert and oriented to person, place, and time.  Skin: Skin is warm and dry.  Psychiatric: She has a normal mood and affect. Her behavior is normal. Thought content normal.   Breasts: Left breast small pea size mass at 3 o'clock,  Non tender, mobile,no nipple discharge Right breast normal no masses.     Assessment:    Left breast mass Constipation chronic history Bloating history and concern regarding ovarian cancer symptoms, previous PUS in 2016 negative    Plan:     Discussed left breast mass finding and need to evaluate with diagnostic mammogram and [place]. Patient agreeable and will schedule prior to leaving today. Discussed chronic constipation and ?IBS history and need for GI referral for evaluation. Discussed importance of water to help with normal bowel movements. Discussed glycerin suppository use prn to see if helps. Can not do just Miralax with fluid intake which can increase constipation problem. Discussed dietary addition and regular exercise to help with this. Handout given and agreeable to referral to Dr. Collene Mares. Discussed limit ability to assess for ovarian cancer except for PUS and Ca 125. She was seen by Dr. [name] for her first visit and will consult to see if she feels follow up with imaging is needed and advise patient agreeable. Discussed oral probiotic use to re-establish normal bowel function, information given. Questions addressed at length with both patient and spouse.  Warning signs of abdominal pain given and need to advise.   20 minutes in face to face consult with patient and spouse regarding above problems.  Rv prn

## 2016-01-10 NOTE — Progress Notes (Signed)
Scheduled patient while in office for L breast diagnostic mammogram with ultrasound at the [facility] on 01/13/2016 at 11:30 am with 11:10 am arrival time. Patient is agreeable to date and time. Aware she will need to contact [facility] to have her records transferred to the [facility]. Placed in mammogram hold. Scheduled appointment for patient to see Dr.Mann on 01/23/2016 at 8:30 am. Patient is agreeable to date and time.

## 2016-01-10 NOTE — Patient Instructions (Addendum)
Dieta sem lactose, adultos (Lactose-Free Diet, Adult) Quando voc sofre de intolerncia  lactose, voc no consegue digerir a lactose. A lactose  um acar natural encontrado principalmente no leite e em produtos lcteos. Voc pode precisar evitar todos os alimentos e bebidas que contm lactose. Uma dieta sem lactose pode ajud-lo a fazer isso. O QUE PRECISO SABER SOBRE ESSA DIETA?  No consuma alimentos, bebidas, vitaminas, minerais ou medicamentos com lactose. Leia atentamente as listas de ingredientes.  Procure as palavras "sem lactose" nos rtulos.  Use gotas ou comprimidos da enzima lactase conforme instrudo por seu mdico.  Use leite sem lactose ou leites alternativos, tais como leite de soja, para beber e [employer].  Certifique-se de obter clcio e vitamina D suficientes na sua dieta. Um plano de refeies sem lactose pode no conter quantidades suficientes desses importantes nutrientes.  Tome suplementos de clcio e de vitamina D conforme orientado por seu mdico. Converse com seu mdico sobre suplementos caso no [place] obter clcio e vitamina D suficientes dos alimentos. QUAIS ALIMENTOS CONTM LACTOSE? A lactose  encontrada nos seguintes alimentos:  Leite e em alimentos derivados do leite.  Iogurte.  Queijo.  Manteiga.  Margarina.  Creme azedo.  Creme de leite.  Creme para caf no lcteo e creme chantili.  Sorvete e outras sobremesas baseadas em leite. A lactose tambm  encontrada em alimentos e produtos feitos com leite ou ingredientes lcteos. Para descobrir se um alimento contm leite ou um ingrediente lcteo, olhe a lista de ingredientes. Evite alimentos com a expresso "Pode conter leite" e alimentos que contenham:  Manteiga.  Creme.  Leite.  Slidos do leite.  Leite em p.  Soro de [employer].  Coalho.  Caseinato.  Lactose.  Lactoalbumina.  Lactoglobulina. QUAIS SO ALGUMAS DAS ALTERNATIVAS AO LEITE E AOS ALIMENTOS FEITOS COM PRODUTOS  LCTEOS?  Leite sem lactose.  Leite de soja enriquecido com clcio e vitamina D.  Leite de [place], de coco ou de arroz enriquecido com clcio e vitamina D. Note que todos estes tm baixo teor de protenas.  Produtos da soja, tais como iogurte de soja, queijo de soja, sorvete de soja e creme azedo baseado em soja. QUAIS ALIMENTOS POSSO INGERIR? Gros  Pes feitos sem leite, como po francs, de viena ou [place], bagels, pita e zimo. Tortilhas de milho, farinha de [place], papa de milho e polenta. Bolachas sem lactose ou slidos do leite, tais como bolacha de gua e sal e bolacha Graham. Cereais cozidos ou desidratados sem lactose ou slidos do leite. Massas, quinoa, cuscuz, cevada, Elna Breslow, triguilho, farro, arroz, arroz selvagem ou outros gros preparados sem leite ou lactose. Pipoca simples. Verduras  Verduras frescas, congeladas e enlatadas sem molhos contendo queijo, creme de leite ou [place]. Frutas  Todas as frutas frescas, enlatadas, congeladas ou desidratadas no processadas com lactose. Carnes e outras fontes de protena  Carne bovina, frango, peixe, [place], carneiro, vitela, porco, carne de caa ou presunto simples. Produtos de Engineer, water. Alimentos infantis com carne que no contenham leite. Ovos. Substitutos de carne de soja. Feijes, lentilhas e homus. Queijo tofu. Nozes e sementes. Manteiga de amendoim ou outras leguminosas sem lactose. Sopas, cozidos e pratos mistos sem queijo, creme de leite ou leite. Produtos lcteos  Leite sem lactose. Leite de soja, de arroz ou amndoa enriquecido com clcio e vitamina D. Luana e iogurte de soja. Bebidas  Bebidas gaseificadas. Ch. Caf, caf liofilizado e alguns cafs instantneos. Sucos de frutas e de verduras. Condimentos  Molho de soja. P de alfarroba. Azeitonas.  Caldo de carne feito com gua. Chocolate Baker. Picles. Temperos e condimentos puros. Ketchup. Mostarda. Caldo de carne. Sopa. Doces e sobremesas  gua e sucos de  futas. Gelatina. Biscoitos, tortas ou bolos feitos com ingredientes permitidos, tais como bolo do tipo "bolo dos anjos". Pudim feito com gua ou outro substituto do leite. Sobremesas de tofu sem lactose. Sobremesas congeladas baseadas em soja, leite de coco ou leite de Occupational psychologist. Acar. Mel. [place] Meo. Melado. Doces de acar puro. Chocolate escuro sem leite. Marshmallows. Gorduras e leos  Margarinas e temperos para salada sem leite. Berniece Salines. leos vegetais. Gordura. Maionese. Creme de soja ou baseado em coco. Os itens listados acima podem no ser [place] alimentos e bebidas recomendados. Entre em contato com seu nutricionista para mais opes.  QUAIS SO OS ALIMENTOS NO RECOMENDADOS? Gros  Pes que contenham leite. Doces de massa preparados em torradeira. Muffins, biscoitos, waffles, po de milho e panquecas. Esses itens podem ser preparados em casa, em estabelecimentos comerciais ou ser misturas. Pes doces, donuts, muffin ingls, fry bread, lefse, tortilhas de farinha com lactose ou rabanadas feitas com leite ou ingredientes do leite. Bolachas que contenham lactose. Biscoitos do tipo Cheetos. Cereais cozidos ou desidratados com lactose. Verduras  Cremes de verduras ou verduras fritas. Verduras em molho contendo queijo ou manteiga ou com margarinas contendo lactose. Pur de batatas instantneo. Batatas fritas. Escalope de batatas ou batatas gratinadas. Frutas  [place]. Carnes e outras fontes de protena  Ovos mexidos, omeletes e sufls com leite. Carne, peixe, frango ou [place] cremoso ou empanado. Salsichas, tais como salsicha tipo vienense e salsicha de fgado. Frios que contenham slidos do leite. Queijo, queijo cottage, ricota e queijos cremosos. Acquanetta Belling e queijo. Pizza. Manteiga de amendoim e outras leguminosas enriquecidas com slidos do leite. Ensopados ou refeies mistas contendo leite ou queijo. Produtos lcteos  Todos os produtos lcteos, incluindo  leite, leite de [place], leitelho, quefir, leite acidfilo, bebidas lcteas, leite desidratado, leite condensado, doce de leite, eggnog, iogurte, queijo e queijos cremosos. Bebidas  Chocolate quente. [place] com lactose. Chs gelados instantneos. Ps para preparao de bebidas de frutas. Vitaminas feitas com leite ou iogurte. Condimentos  Chicletes com lactose. [place]acau com lactose. Preparados de condimentos caso contenham produtos do leite. Adoantes artificiais que contenham lactose. Cremes para caf no derivados do leite. Doces e sobremesas  Sorvete, ice milk, sorvete italiano, sherbet e iogurte congelado. Manjar, pudim e musse. Bolo, tortas cremosas, biscoitos e outras sobremesas contendo leite, creme de leite, queijo cremoso ou leite achocolatado. Crosta de torta com margarina ou manteiga contendo leite. Sobremesas com calorias reduzidas feitas com substitutos do acar contendo lactose. Balas de leite e balas de manteiga e caramelo. Chocolate ao leite, chocolate branco e chocolate escuro contendo leite. Fudge. Caramelo. Gorduras e leos  Margarinas e temperos para salada contendo leite ou queijo. Cremes. Half and half. Queijo cremoso. Creme azedo. Pats feitos com creme azedo ou iogurte. Os itens listados acima podem no ser [facility] alimentos e bebidas a evitar. Entre em contato com seu nutricionista para mais informaes.  [place]? O clcio  encontrado em muitos alimentos que contm lactose e  importante para a [employer]. A quantidade de clcio de que voc precisa depende de sua idade.  Adultos com menos de 50 anos de idade: 1000 mg de clcio por dia.  Adultos com mais de 50 anos de idade: 1200 mg de clcio por dia. Caso no esteja ingerindo suficiente clcio, outras fontes de  clcio incluem:  Suco de laranja com adio de clcio. H 300-350 mg de clcio em 1 xcara de suco de laranja.  Sardinhas com ossos comestveis. H 325 mg de clcio em 3 oz  de sardinhas.  Leite de soja enriquecido com clcio. H 300-400 mg de clcio em 1 xcara de leite de soja enriquecido com clcio.  Leite de arroz ou de amndoas enriquecido com clcio. H 300 mg de clcio em 1 xcara de leite de soja ou de amndoas enriquecido com clcio.  Salmo enlatado com ossos comestveis. H 180 mg de clcio em 3 oz de salmo enlatado com ossos comestveis.  Cereais para o caf da manh enriquecidos com clcio. H [phone] mg de clcio em cereais para o caf da manh enriquecidos com clcio.  Tofu com sulfato de clcio. H 250 mg de clcio em  xcara de tofu com sulfato de clcio.  Espinafre, cozido. H 145 mg de clcio em  xcara de espinafre cozido.  Edamame, cozido. H 130 mg de clcio em  xcara de edamame cozido.  Couve-galega, cozida. H 125 mg de clcio em  xcara de couve-galega cozida.  Couve-de-folhas, congelada ou cozida. H 90 mg de clcio em  xcara de couve-de-folhas cozida ou congelada.  Amndoas. H 95 mg de clcio em  de xcara de [place].  Brcolis, cozido. H 60 mg de clcio em 1 xcara de brcolis cozido. Estas informaes no se destinam a substituir as recomendaes de seu mdico. No deixe de discutir quaisquer dvidas com seu mdico. Document Released: 05/08/2008 Document Revised: 06/26/2014 Document Reviewed: 05/12/2013 Elsevier Interactive Patient Education  2017 [place], em adultos (Constipation, Adult) Constipao  quando uma pessoa tem menos de trs evacuaes por semana, tem dificuldade para evacuar ou tem fezes secas, duras ou maiores que o normal.  medida que envelhecemos, a constipao se torna mais comum. Uma dieta pobre em fibras, ingesto insuficiente de lquidos e o uso de certos medicamentos podem piorar a constipao.  CAUSAS   Certos medicamentos, como antidepressivos, analgsicos, suplementos de ferro, anticidos e diurticos.  Certas doenas, como diabetes, sndrome do intestino irritvel  (SII), doena da tireoide ou depresso.  Ingesto insuficiente de gua.  Ingesto insuficiente de alimentos ricos em fibras.  Estresse ou viagem.  Falta de atividade fsica ou exerccios.  Ignorar a Patent examiner.  Uso excessivo de laxantes. SINAIS E SINTOMAS   Menos de trs evacuaes por semana.  Esforo para evacuar.  Fezes duras, secas ou maiores que o normal.  Sentir-se pesado ou inchado.  Dor na parte inferior do abdome.  Ausncia de alvio aps a evacuao. DIAGNSTICO  Seu mdico coletar um histrico mdico e realizar um exame fsico. Exames adicionais podero ser realizados em caso de constipao intensa. Alguns exames podem incluir:  Uma radiografia com enema de brio para examinar o reto, o clon e, algumas vezes, o intestino delgado.  Uma sigmoidoscopia para examinar a parte inferior do clon.  Uma colonoscopia para examinar todo o clon. TRATAMENTO  O tratamento depender da intensidade da constipao e do que est causando-a. Alguns tratamentos alimentares incluem tomar mais lquido e comer mais alimentos ricos em [place]. Tratamentos baseados em alteraes do estilo de vida podem incluir exerccios regulares. Se essas recomendaes de dieta e estilo de vida no ajudarem, seu mdico poder recomendar o uso de laxantes de venda livre para ajud-lo a evacuar. Medicamentos com receita mdica podero ser prescritos se os de venda livre no funcionarem.  INSTRUES PARA TRATAMENTO DOMICILIAR   Coma alimentos  ricos em [place], como frutas, legumes, gros integrais e feijo.  Limite alimentos ricos em gordura e acar refinado, como batata frita, hambrgueres, biscoitos, doces e refrigerantes.  Um suplemento de fibras pode ser adicionado  sua dieta caso voc no esteja obtendo fibras [employer].  Beba lquidos em quantidade suficiente para manter sua urina clara ou com cor amarelo plida.  Exercite-se regularmente ou conforme orientado por  seu mdico.  V para o banheiro quando tiver vontade de Management consultant. No prenda a evacuao.  Somente tome medicamentos de venda livre ou vendidos com prescrio mdica conforme orientado por seu mdico.No tome nenhum outro medicamento para constipao sem consultar seu mdico primeiro. PROCURE UM MDICO IMEDIATAMENTE SE:   Houver sangue vermelho vivo nas fezes.  A constipao durar mais de 4 dias ou piorar.  Sentir dor abdominal ou retal.  Tiver fezes finas semelhantes a lpis.  Tiver uma perda de peso inexplicada. CERTIFIQUE-SE DE:   Compreender estas instrues.  Observar seu estado de sade.  Procurar um mdico imediatamente se no se sentir bem ou piorar. Estas informaes no se destinam a substituir as recomendaes de seu mdico. No deixe de discutir quaisquer dvidas com seu mdico. Document Released: 12/07/2008 Document Revised: 03/02/2014 Elsevier Interactive Patient Education  2017 [employer].

## 2016-01-13 ENCOUNTER — Ambulatory Visit
Admission: RE | Admit: 2016-01-13 | Discharge: 2016-01-13 | Disposition: A | Discharge status: Home / Self Care | Payer: 59 | Source: Ambulatory Visit | Attending: Certified Nurse Midwife | Admitting: Certified Nurse Midwife

## 2016-01-13 DIAGNOSIS — N6320 Unspecified lump in the left breast, unspecified quadrant: Secondary | ICD-10-CM

## 2016-01-14 ENCOUNTER — Telehealth: Payer: Self-pay

## 2016-01-14 NOTE — Progress Notes (Signed)
I would recommend that she f/u with GI for consultation prior to GYN ultrasound. She has no FH of ovarian cancer and has clear GI symptoms.

## 2016-01-14 NOTE — Telephone Encounter (Signed)
-----   Message from Regina Eck, CNM sent at 01/14/2016  2:31 PM EST ----- No dominant masses or suspicious findings in left breast on mammogram US showed fibroglandular and fat globules noted.Several tiny benign cysts were noted in UOQ. Patient had also acknowledged nipple discharge on one occasion according results. Prolactin level was done earlier when seeing Dr. [name]. Patient needs to call if any further nipple discharge and have follow up breast exam as usual.

## 2016-01-14 NOTE — Telephone Encounter (Signed)
Spoke with patient. Advised of results and message as seen below from [place]. Patient verbalizes understanding. Breast recheck appointment scheduled for 03/06/2016 at 10:30 am with Melvia Heaps CNM. Patient is agreeable to date and time. Will come by the office tomorrow or early next week to pick up health screening form.  Notes Recorded by Regina Eck, CNM on 01/14/2016 at 2:39 PM EST See previous note Also her health screening form is complete and at front desk per her request  Routing to provider for final review. Patient agreeable to disposition. Will close encounter.

## 2016-01-15 NOTE — Progress Notes (Signed)
She has a referral already placed., noted in my note. So will not schedule other until completed.

## 2016-01-21 ENCOUNTER — Other Ambulatory Visit: Payer: Self-pay | Admitting: Certified Nurse Midwife

## 2016-01-21 DIAGNOSIS — M25559 Pain in unspecified hip: Secondary | ICD-10-CM

## 2016-01-21 DIAGNOSIS — R140 Abdominal distension (gaseous): Secondary | ICD-10-CM

## 2016-01-21 NOTE — Addendum Note (Signed)
Addended by: Jasmine Awe on: 01/21/2016 11:38 AM   Modules accepted: Orders

## 2016-01-21 NOTE — Progress Notes (Signed)
Please notify patient that I discussed her pelvic bloating and pain. She also agreed she needed GI referral and PUS. PUS order in and she will be called with scheduling and insurance info.

## 2016-01-21 NOTE — Progress Notes (Signed)
Spoke with patient. Patient has been notified of recommendation to have PUS in the office with Dr.Silva. Patient is agreeable. PUS scheduled for 01/23/2016 at 9 am with 9:30 am consult with Dr.Silva. Patient is agreeable to date and time. Patient is aware she will be contacted regarding benefits for PUS in office. New order placed for PUS to be done with Dr.Silva. Patient has concerns about recent breast imaging and would like to discuss this with Dr.Silva at her OV.

## 2016-01-23 ENCOUNTER — Ambulatory Visit (INDEPENDENT_AMBULATORY_CARE_PROVIDER_SITE_OTHER): Payer: Managed Care, Other (non HMO)

## 2016-01-23 ENCOUNTER — Ambulatory Visit (INDEPENDENT_AMBULATORY_CARE_PROVIDER_SITE_OTHER): Payer: Managed Care, Other (non HMO) | Admitting: Obstetrics and Gynecology

## 2016-01-23 ENCOUNTER — Encounter: Payer: Self-pay | Admitting: Obstetrics and Gynecology

## 2016-01-23 VITALS — BP 100/60 | HR 90 | Ht 65.5 in | Wt 144.0 lb

## 2016-01-23 DIAGNOSIS — F4320 Adjustment disorder, unspecified: Secondary | ICD-10-CM

## 2016-01-23 DIAGNOSIS — N6320 Unspecified lump in the left breast, unspecified quadrant: Secondary | ICD-10-CM | POA: Diagnosis not present

## 2016-01-23 DIAGNOSIS — R140 Abdominal distension (gaseous): Secondary | ICD-10-CM

## 2016-01-23 DIAGNOSIS — M25559 Pain in unspecified hip: Secondary | ICD-10-CM | POA: Diagnosis not present

## 2016-01-23 NOTE — Progress Notes (Signed)
Patient ID: Joanna Yang, female   DOB: [birth year], 45 y.o.   MRN: [id number] GYNECOLOGY  VISIT   HPI: 45 y.o.   Married  [place]  female   [phone] with Patient's last menstrual period was 01/01/2016 (exact date).   here for pelvic ultrasound for abdominal bloating and concerns about her breast exam.  Husband present for the entire visit.   Long hx of constipation.  Has bloating and is concerned about ovarian cancer. Feels like she looks pregnancy due to bloating.  Felt so full she could not eat.  Increased Miralax and felt better after expelling gas.  When she was here for her last visit did not have a bowel movement for an entire week.   Will be seeing a GI, Dr. Collene Mares, on December 15.   Menses are regular and every 21 days.  Does not want to take birth control to modify cycles.  No excessive bleeding with pad change per hour.   Patient feels that her ultrasound of the left breast was not done at the 12:00 position where she feels a change that is like a ridge. States that Evalee Mutton felt something at 3:00 and that this part was evaluated with ultrasound and was normal.  Also has concerns about radiation exposure.   Has not yet seen counselor about her anxiety.  GYNECOLOGIC HISTORY: Patient's last menstrual period was 01/01/2016 (exact date). Contraception:  Tubal ligation Menopausal hormone therapy:  none Last mammogram:  01/13/16:  Dx left mammogram and left breast ultrasound - several tiny cysts in the left upper and outer quadrant.  No sign of malignancy.  Last pap smear:   04-20-14 Neg:Neg HR HPV         OB History    Gravida Para Term Preterm AB Living   3 2 2   1 2    SAB TAB Ectopic Multiple Live Births   1       2         There are no active problems to display for this patient.   Past Medical History:  Diagnosis Date  . Anemia   . Anxiety   . Diabetes mellitus without complication ([place])   . Fibroid   . Heart murmur    pt. thinks she has a benign  murmur dx'd during pregnancy    Past Surgical History:  Procedure Laterality Date  . CESAREAN SECTION  2009  . TUBAL LIGATION  2009    Current Outpatient Prescriptions  Medication Sig Dispense Refill  . ferrous fumarate (HEMOCYTE - 106 MG FE) 325 (106 FE) MG TABS tablet Take 1 tablet by mouth as needed.     . polyethylene glycol (MIRALAX / GLYCOLAX) packet Take 17 g by mouth daily. Takes 1/2 capful daily     No current facility-administered medications for this visit.      ALLERGIES: Patient has no known allergies.  Family History  Problem Relation Age of Onset  . Hypertension Mother   . Arrhythmia Mother   . Depression Mother   . Hypertension Father   . Cancer Paternal Grandmother     tumor in spine  . Dementia Paternal Grandfather     Social History   Social History  . Marital status: Married    Spouse name: N/A  . Number of children: N/A  . Years of education: N/A   Occupational History  . Not on file.   Social History Main Topics  . Smoking status: Never Smoker  . Smokeless tobacco: Never Used  .  Alcohol use No  . Drug use: No  . Sexual activity: Yes    Partners: Male    Birth control/ protection: Surgical     Comment: Tubal   Other Topics Concern  . Not on file   Social History Narrative  . No narrative on file    ROS:  Pertinent items are noted in HPI.  PHYSICAL EXAMINATION:    BP 100/60 (BP Location: Right Arm, Patient Position: Sitting, Cuff Size: Normal)   Pulse 90   Ht 5' 5.5" (1.664 m)   Wt 144 lb (65.3 kg)   LMP 01/01/2016 (Exact Date)   BMI 23.60 kg/m     General appearance: alert, cooperative and appears stated age   Breasts:  Left breast with ridge at the 12:00 position at edge of areola. No sure if there is mass there or not.  No nodes, retractions, nipple discharge or axillary adenopathy.  Right breast - no nodes, retractions, nipple discharge or axillary adenopathy.  Pelvic ultrasound -  Small 1 cm fibroid.  Normal EMS.   Normal ovaries.  Small amount of free fluid.  Chaperone was present for exam.  ASSESSMENT  Bloating and constipation.  No sign of ovarian cancer.  Fibroid.  Left breast mass.  Anxiety.  PLAN  Discussed increased fiber, water, exercise.  Follow up with GI.  Discussed signs and symptoms of ovarian cancer.  Return for targeted US of left breast at 12:00.  Probable routine mammogram in March 2018.  May choose to do mammograms then every other year.  Follow up for annual exam and prn.  She will establish care with [place] counseling at [facility] in Jan. 2018.    An After Visit Summary was printed and given to the patient.  _30_____ minutes face to face time of which over 50% was spent in counseling.

## 2016-01-23 NOTE — Progress Notes (Signed)
Scheduled patient while in office for left breast ultrasound at the [facility] on 01/31/2016 at 11:10 am. Order placed for diagnostic imaging of left breast as well per protocol of the [facility]. They are aware Dr.Silva recommends ultrasound only and state the patient can review this is the radiologist at her appointment. Patient is agreeable. Placed in mammogram hold.

## 2016-01-23 NOTE — Progress Notes (Signed)
Encounter reviewed by Dr. Yoona Ishii Amundson C. Silva.  

## 2016-01-30 ENCOUNTER — Encounter: Payer: Self-pay | Admitting: Interventional Cardiology

## 2016-01-30 ENCOUNTER — Ambulatory Visit (INDEPENDENT_AMBULATORY_CARE_PROVIDER_SITE_OTHER): Payer: Managed Care, Other (non HMO) | Admitting: Interventional Cardiology

## 2016-01-30 VITALS — BP 110/80 | HR 89 | Ht 65.5 in | Wt 143.0 lb

## 2016-01-30 DIAGNOSIS — R0602 Shortness of breath: Secondary | ICD-10-CM

## 2016-01-30 DIAGNOSIS — R5383 Other fatigue: Secondary | ICD-10-CM | POA: Diagnosis not present

## 2016-01-30 NOTE — Progress Notes (Signed)
Cardiology Office Note   Date:  01/30/2016   ID:  Joanna Yang, DOB 1970-09-06, MRN [id number]  PCP:  Eloise Levels, NP    No chief complaint on file. fatigue   Wt Readings from Last 3 Encounters:  01/30/16 143 lb (64.9 kg)  01/23/16 144 lb (65.3 kg)  01/10/16 143 lb (64.9 kg)       History of Present Illness: Joanna Yang is a 45 y.o. female  Who has had fatigue over the past few months.  She has not been exercising.  She had these sx several years ago when in a [place].  An ETT was recommended but she did not have this done.  She has since moved to [place].    Her diet could be improved.  She has some constipation so she has decreased dairy and increased fruits and vegetables.  She eats a lot of pizza.  She has a son with an autism spectrum disorder who has very restricted food choices and pizza is one of them. She has a lot of stress at home. Her son has improved quite a bit over the past few years but food selection is still a big issue. Her husband has poorly controlled diabetes. The patient actually has a degree in nutrition from [place].  Over the past few weeks, her fatigue has become worse. There were days where she didn't want to get out of bed. No specific chest discomfort. If she tries to exert herself, she may have some shortness of breath. No lower extremity edema. No lightheadedness or syncope. No palpitations.    Past Medical History:  Diagnosis Date  . Anemia   . Anxiety   . Diabetes mellitus without complication ([place])   . Fibroid   . Heart murmur    pt. thinks she has a benign murmur dx'd during pregnancy    Past Surgical History:  Procedure Laterality Date  . CESAREAN SECTION  2009  . TUBAL LIGATION  2009     Current Outpatient Prescriptions  Medication Sig Dispense Refill  . ferrous fumarate (HEMOCYTE - 106 MG FE) 325 (106 FE) MG TABS tablet Take 1 tablet by mouth as needed.     . polyethylene glycol (MIRALAX / GLYCOLAX) packet Take 17 g by mouth  daily. Takes 1/2 capful daily     No current facility-administered medications for this visit.     Allergies:   Patient has no known allergies.    Social History:  The patient  reports that she has never smoked. She has never used smokeless tobacco. She reports that she does not drink alcohol or use drugs.   Family History:  The patient's family history includes Arrhythmia in her mother; Cancer in her paternal grandmother; Dementia in her paternal grandfather; Depression in her mother; Heart disease in her mother; Heart failure in her mother; Hypertension in her father and mother. Mother in [place].   ROS:  Please see the history of present illness.   Otherwise, review of systems are positive for fatigue.   All other systems are reviewed and negative.    PHYSICAL EXAM: VS:  BP 110/80   Pulse 89   Ht 5' 5.5" (1.664 m)   Wt 143 lb (64.9 kg)   LMP 01/01/2016 (Exact Date)   BMI 23.43 kg/m  , BMI Body mass index is 23.43 kg/m. GEN: Well nourished, well developed, in no acute distress  HEENT: normal  Neck: no JVD, carotid bruits, or masses Cardiac: RRR; no murmurs, rubs, or  gallops,no edema  Respiratory:  clear to auscultation bilaterally, normal work of breathing GI: soft, nontender, nondistended, + BS MS: no deformity or atrophy  Skin: warm and dry, no rash Neuro:  Strength and sensation are intact Psych: euthymic mood, full affect   EKG:   The ekg ordered today demonstrates NSR, rSR', no significant ST segment changes. LVH   Recent Labs: 09/20/2015: Hemoglobin 14.2; Platelets 225; TSH 1.52 01/10/2016: ALT 12; BUN 10; Creat 0.72; Potassium 4.1; Sodium 141   Lipid Panel    Component Value Date/Time   CHOL 186 01/10/2016 1701   TRIG 116 01/10/2016 1701   HDL 55 01/10/2016 1701   CHOLHDL 3.4 01/10/2016 1701   VLDL 23 01/10/2016 1701   LDLCALC 108 (H) 01/10/2016 1701     Other studies Reviewed: Additional studies/ records that were reviewed today with results  demonstrating: Echocardiogram done in [place] in 2014 showed normal left jugular function and normal valvular function; there was mild mitral regurgitation and trace right cuspid regurgitation with no evidence of pulmonary artery hypertension..   ASSESSMENT AND PLAN:  1. Fatigue/dyspnea on exertion: We'll plan for exercise treadmill test. She does have some LVH on her ECG so we'll have to take this into account in the interpretation. I think she has a low pretest probability of severe coronary artery disease based on her symptoms. She does not have many risk factors for heart disease either. She would like to get the test done before the end of the calendar year for insurance purposes.  She is somewhat anxious as well. Hopefully, a low risk stress test result will be reassuring. 2. Family history of heart disease: Mother has congestive heart failure. 3. Of note, the patient does not have diabetes as is documented in the history.   Current medicines are reviewed at length with the patient today.  The patient concerns regarding her medicines were addressed.  The following changes have been made:  No change  Labs/ tests ordered today include:  No orders of the defined types were placed in this encounter.   Recommend 150 minutes/week of aerobic exercise Low fat, low carb, high fiber diet recommended  Disposition:   FU for stress test   Signed, Larae Grooms, MD  01/30/2016 3:32 PM    [facility] [place], [place], [place]  [address] Phone: [phone]; Fax: [phone]

## 2016-01-30 NOTE — Patient Instructions (Signed)
**Note De-Identified Joanna Yang Obfuscation** Medication Instructions:  Same-no changes  Labwork: None  Testing/Procedures: Your physician has requested that you have an exercise tolerance test. For further information please visit [email or web address]. Please also follow instruction sheet, as given.   Follow-Up: Based on results of Echo     If you need a refill on your cardiac medications before your next appointment, please call your pharmacy.

## 2016-01-31 ENCOUNTER — Ambulatory Visit
Admission: RE | Admit: 2016-01-31 | Discharge: 2016-01-31 | Disposition: A | Discharge status: Home / Self Care | Payer: Managed Care, Other (non HMO) | Source: Ambulatory Visit | Attending: Obstetrics and Gynecology | Admitting: Obstetrics and Gynecology

## 2016-01-31 DIAGNOSIS — N6320 Unspecified lump in the left breast, unspecified quadrant: Secondary | ICD-10-CM

## 2016-02-05 ENCOUNTER — Encounter: Payer: Self-pay | Admitting: Interventional Cardiology

## 2016-02-11 ENCOUNTER — Ambulatory Visit: Payer: 59 | Admitting: Interventional Cardiology

## 2016-02-12 ENCOUNTER — Ambulatory Visit (INDEPENDENT_AMBULATORY_CARE_PROVIDER_SITE_OTHER): Payer: Managed Care, Other (non HMO)

## 2016-02-12 DIAGNOSIS — R0602 Shortness of breath: Secondary | ICD-10-CM | POA: Diagnosis not present

## 2016-02-12 LAB — EXERCISE TOLERANCE TEST
CHL RATE OF PERCEIVED EXERTION: 17
CSEPED: 7 min
CSEPHR: 94 %
Estimated workload: 8.5 METS
Exercise duration (sec): 0 s
MPHR: 176 {beats}/min
Peak HR: 166 {beats}/min
Rest HR: 98 {beats}/min

## 2016-02-18 ENCOUNTER — Telehealth: Payer: Self-pay | Admitting: Interventional Cardiology

## 2016-02-18 DIAGNOSIS — R9439 Abnormal result of other cardiovascular function study: Secondary | ICD-10-CM

## 2016-02-18 NOTE — Telephone Encounter (Signed)
I agree that I think it is unlikely that her pains are cardiac.  No blood work needed at this time.

## 2016-02-18 NOTE — Telephone Encounter (Signed)
Patient is complaining of sharp pain at her left collar bone, that radiates to her left shoulder and lower back. Patient stated this has been going on for the last couple of days. Patient stated she feels anxious since she talked to Dr. Irish Lack. Patient is concerned since her ETT was abnormal. Ordered Stress echo according to ETT result note from Dr. Irish Lack. Patient wants Dr. Irish Lack to know about her symptoms, and see if there is any lab work she should have done right now. Patient wanted a diagnosis code for stress echo, and she will check with her insurance about coverage. Patient stated if she had stress echo before the end of the year, that she would not have to worry about insurance coverage. Consulted Dr. Burt Knack (DOD), he recommends stress echo. Informed patient that the next appt for stress echo is mid January. Informed her that her pain does seem cardiac related, but a stress echo will give [place] more information to determine if her symptoms are related to her heart. Patient stated she was going to take Tylenol for her pain to see if that helps. Encouraged the patient to call if her symptoms did not improve with OTC pain medications. Patient verbalized understanding. Will send message to bill to help with patient's questions. Will forward to Dr. Irish Lack so he is aware.

## 2016-02-18 NOTE — Telephone Encounter (Signed)
New message      Talk to the nurse to report new symptoms.  She would not tell me what they were, she want to tell the nurse

## 2016-02-19 NOTE — Telephone Encounter (Signed)
I spoke with pt and gave her information from Dr. Irish Lack. She reports she is fighting a cold. Husband may have the flu. She was sleeping last night and heard husband get up. Upon waking she felt pain in her left breast through to back. Different from pain she experienced yesterday. She laid down on her right side and pain quickly went away. No pain since that time. I encouraged him to keep stress echo appt as planned. She will call us back if change in symptoms

## 2016-02-19 NOTE — Telephone Encounter (Signed)
Left message to call back  

## 2016-02-26 ENCOUNTER — Encounter: Payer: Self-pay | Admitting: Physician Assistant

## 2016-02-26 ENCOUNTER — Telehealth: Payer: Self-pay | Admitting: Physician Assistant

## 2016-02-26 ENCOUNTER — Telehealth: Payer: Self-pay | Admitting: Interventional Cardiology

## 2016-02-26 ENCOUNTER — Other Ambulatory Visit: Payer: Self-pay | Admitting: Physician Assistant

## 2016-02-26 ENCOUNTER — Ambulatory Visit (INDEPENDENT_AMBULATORY_CARE_PROVIDER_SITE_OTHER): Payer: Managed Care, Other (non HMO) | Admitting: Physician Assistant

## 2016-02-26 VITALS — BP 102/68 | HR 91 | Ht 66.0 in | Wt 146.1 lb

## 2016-02-26 DIAGNOSIS — I340 Nonrheumatic mitral (valve) insufficiency: Secondary | ICD-10-CM

## 2016-02-26 DIAGNOSIS — R5383 Other fatigue: Secondary | ICD-10-CM

## 2016-02-26 DIAGNOSIS — R079 Chest pain, unspecified: Secondary | ICD-10-CM

## 2016-02-26 DIAGNOSIS — R9439 Abnormal result of other cardiovascular function study: Secondary | ICD-10-CM

## 2016-02-26 MED ORDER — NITROGLYCERIN 0.4 MG SL SUBL: 0.4000 mg | SUBLINGUAL_TABLET | SUBLINGUAL | 3 refills | Status: DC | PRN

## 2016-02-26 MED ORDER — OMEPRAZOLE 20 MG PO CPDR: 20.0000 mg | DELAYED_RELEASE_CAPSULE | Freq: Every day | ORAL | 11 refills | Status: DC

## 2016-02-26 MED ORDER — ASPIRIN 81 MG PO TABS: 81.0000 mg | ORAL_TABLET | Freq: Every day | ORAL | Status: DC

## 2016-02-26 NOTE — Patient Instructions (Addendum)
Medication Instructions:  1) START ASPIRIN 81 mg daily (this is an over the counter medication) 2) START OMEPRAZOLE 20 mg daily 3) START NITROGLYCERIN AS NEEDED (please see below for more details). DO NOT EXCEED MORE THAN THREE DOSES IN 15 MINUTES. IF YOU TAKE 3 DOSES WITH NO RELIEF, CALL 911.  Labwork: TODAY: BMET, CBC, Lipase, Troponin, LFTs  Testing/Procedures: Your STRESS ECHO has been rescheduled to 03/03/16 at 1:00PM. Please arrive at the [place] (admitting) of [facility] at 12:45PM for this test.  Follow-Up: You have been scheduled to see Dr. Hassell Done assistant Lyda Jester, PA on 03/05/16 at 11:00AM.  Any Other Special Instructions Will Be Listed Below (If Applicable). Nitroglycerin sublingual tablets What is this medicine? NITROGLYCERIN (nye troe GLI ser in) is a type of vasodilator. It relaxes blood vessels, increasing the blood and oxygen supply to your heart. This medicine is used to relieve chest pain caused by angina. It is also used to prevent chest pain before activities like climbing stairs, going outdoors in cold weather, or sexual activity. This medicine may be used for other purposes; ask your health care provider or pharmacist if you have questions. COMMON BRAND NAME(S): Nitroquick, Nitrostat, Nitrotab What should I tell my health care provider before I take this medicine? They need to know if you have any of these conditions: -anemia -head injury, recent stroke, or bleeding in the brain -liver disease -previous heart attack -an unusual or allergic reaction to nitroglycerin, other medicines, foods, dyes, or preservatives -pregnant or trying to get pregnant -breast-feeding How should I use this medicine? Take this medicine by mouth as needed. At the first sign of an angina attack (chest pain or tightness) place one tablet under your tongue. You can also take this medicine 5 to 10 minutes before an event likely to produce chest pain.  Follow the directions on the prescription label. Let the tablet dissolve under the tongue. Do not swallow whole. Replace the dose if you accidentally swallow it. It will help if your mouth is not dry. Saliva around the tablet will help it to dissolve more quickly. Do not eat or drink, smoke or chew tobacco while a tablet is dissolving. If you are not better within 5 minutes after taking ONE dose of nitroglycerin, call 9-1-1 immediately to seek emergency medical care. Do not take more than 3 nitroglycerin tablets over 15 minutes. If you take this medicine often to relieve symptoms of angina, your doctor or health care professional may provide you with different instructions to manage your symptoms. If symptoms do not go away after following these instructions, it is important to call 9-1-1 immediately. Do not take more than 3 nitroglycerin tablets over 15 minutes. Talk to your pediatrician regarding the use of this medicine in children. Special care may be needed. Overdosage: If you think you have taken too much of this medicine contact a poison control center or emergency room at once. NOTE: This medicine is only for you. Do not share this medicine with others. What if I miss a dose? This does not apply. This medicine is only used as needed. What may interact with this medicine? Do not take this medicine with any of the following medications: -certain migraine medicines like ergotamine and dihydroergotamine (DHE) -medicines used to treat erectile dysfunction like sildenafil, tadalafil, and vardenafil -riociguat This medicine may also interact with the following medications: -alteplase -aspirin -heparin -medicines for high blood pressure -medicines for mental depression -other medicines used to treat angina -phenothiazines  like chlorpromazine, mesoridazine, prochlorperazine, thioridazine This list may not describe all possible interactions. Give your health care provider a list of all the  medicines, herbs, non-prescription drugs, or dietary supplements you use. Also tell them if you smoke, drink alcohol, or use illegal drugs. Some items may interact with your medicine. What should I watch for while using this medicine? Tell your doctor or health care professional if you feel your medicine is no longer working. Keep this medicine with you at all times. Sit or lie down when you take your medicine to prevent falling if you feel dizzy or faint after using it. Try to remain calm. This will help you to feel better faster. If you feel dizzy, take several deep breaths and lie down with your feet propped up, or bend forward with your head resting between your knees. You may get drowsy or dizzy. Do not drive, use machinery, or do anything that needs mental alertness until you know how this drug affects you. Do not stand or sit up quickly, especially if you are an older patient. This reduces the risk of dizzy or fainting spells. Alcohol can make you more drowsy and dizzy. Avoid alcoholic drinks. Do not treat yourself for coughs, colds, or pain while you are taking this medicine without asking your doctor or health care professional for advice. Some ingredients may increase your blood pressure. What side effects may I notice from receiving this medicine? Side effects that you should report to your doctor or health care professional as soon as possible: -blurred vision -dry mouth -skin rash -sweating -the feeling of extreme pressure in the head -unusually weak or tired Side effects that usually do not require medical attention (report to your doctor or health care professional if they continue or are bothersome): -flushing of the face or neck -headache -irregular heartbeat, palpitations -nausea, vomiting This list may not describe all possible side effects. Call your doctor for medical advice about side effects. You may report side effects to FDA at 1-800-FDA-1088. Where should I keep my  medicine? Keep out of the reach of children. Store at room temperature between 20 and 25 degrees C (68 and 77 degrees F). Store in [employer]. Protect from light and moisture. Keep tightly closed. Throw away any unused medicine after the expiration date. NOTE: This sheet is a summary. It may not cover all possible information. If you have questions about this medicine, talk to your doctor, pharmacist, or health care provider.  2017 Elsevier/Gold Standard (2012-12-08 17:57:36)     If you need a refill on your cardiac medications before your next appointment, please call your pharmacy.

## 2016-02-26 NOTE — Telephone Encounter (Signed)
Spoke to [employer] with [employer] [phone]), trop negative. Informed patient, Rx for nitro as needed sent to pharmacy, plan for stress echo as previously instructed. Patient displayed understanding and appreciation for the call. For some reason, trop result is still not released to EPIC, but I have confirmed with Tammy twice to make sure the result is < 0.01 and is for Mrs. Fluckiger.   Hilbert Corrigan PA Pager: [phone]

## 2016-02-26 NOTE — Telephone Encounter (Signed)
Follow Up:    Pt having a feeling of heart burning.also a little bit of back pain.She is experiencing back pain right now,no heart burn at this time.Pt feels she needs to be seen.

## 2016-02-26 NOTE — Telephone Encounter (Signed)
Received call from operator and spoke with pt. She reports she is very concerned about stress test results.  For the last 2 days she has been experiencing heart burn. Starts during the night and improves some with antacid.  This is new for her.  Did have heartburn when she was pregnant.  Has also had back pain in the past and is not sure if this could be related to the way she has been sleeping or sitting. Has stress echo scheduled for 1/22.  Pt is asking if she should come in for appt and EKG.  Will review with Dr. Irish Lack.

## 2016-02-26 NOTE — Progress Notes (Signed)
Cardiology Office Note    Date:  02/26/2016  ID:  Joanna Yang, DOB [birth year], MRN [id number] PCP:  Eloise Levels, NP  Cardiologist:  Dr. Irish Lack   Chief Complaint: "heartburn"  History of Present Illness:  Joanna Yang is a 46 y.o. female with history of anemia, anxiety, prior benign heart murmur during pregnancy who presents back to clinic for evaluation of chest discomfort. She was seen by Dr. Irish Lack in 01/2016 for complaints of fatigue for several months. She had also had some exertional dyspnea. She has been under a lot of stress at home. She had a son with autism spectrum disorder with very restricted food choices. (She had these sx several years ago when in a [place].  An ETT was recommended but she did not have this done at that time.) Prior 2D echo in 2014 showed technically limited study, EF 60%, mild MR, trace TR.  Dr. Irish Lack arranged ETT 02/12/16 which showed upsloped 50mm ST depression in V2 & V5 that resolved 1 minute into recovery, inconclusive. He subsequently recommended stress echo to further evaluate. (Coronary CTA was initial test of choice but patient wished to avoid radiation as she has had several scans as a child due to recurrent pneumonia.) Last labs 12/2015: K 4.1, Cr 0.72, LDL 108, glucose WNL. TSH and CBC nl in 08/2015.   She returns for follow-up noting interim development of heartburn similar to the acid reflux she experienced when she was pregnant. This has happened 3 times over the past several days. She had one episode upon waking, one episode after eating, and one when she felt hungry with an empty stomach. The episodes were relieved with antacids but would typically come back for a short while. Tums tended to give almost immediate relief whereas ranitidine would take a half hour to kick in. She remains active with her 2 kids and housework and has not had any exertional symptoms. She did not have any symptoms while exercising during her ETT. No vomiting,  palpitations or syncope. She has been reading about symptoms of heart disease on women on the internet and has become more anxious. She does not have any symptoms right now. She is s/p tubal ligation. She does not smoke.   Past Medical History:  Diagnosis Date  . Anemia   . Anxiety   . Fibroid   . Heart murmur    pt. thinks she has a benign murmur dx'd during pregnancy  . Mitral regurgitation    a. Prior 2D echo in 2014 showed technically limited study, EF 60%, mild MR, trace TR.    Past Surgical History:  Procedure Laterality Date  . CESAREAN SECTION  2009  . TUBAL LIGATION  2009    Current Medications: Current Outpatient Prescriptions  Medication Sig Dispense Refill  . ferrous fumarate (HEMOCYTE - 106 MG FE) 325 (106 FE) MG TABS tablet Take 1 tablet by mouth as needed.     . polyethylene glycol (MIRALAX / GLYCOLAX) packet Take 17 g by mouth daily. Takes 1/2 capful daily     No current facility-administered medications for this visit.      Allergies:   Patient has no known allergies.   Social History   Social History  . Marital status: Married    Spouse name: N/A  . Number of children: N/A  . Years of education: N/A   Social History Main Topics  . Smoking status: Never Smoker  . Smokeless tobacco: Never Used  . Alcohol use No  . Drug  use: No  . Sexual activity: Yes    Partners: Male    Birth control/ protection: Surgical     Comment: Tubal   Other Topics Concern  . None   Social History Narrative  . None     Family History:  The patient's family history includes Arrhythmia in her mother; Cancer in her paternal grandmother; Dementia in her paternal grandfather; Depression in her mother; Heart disease in her mother; Heart failure in her mother; Hypertension in her father and mother.   ROS:   Please see the history of present illness.   All other systems are reviewed and otherwise negative.    PHYSICAL EXAM:   VS:  BP 102/68   Pulse 91   Ht 5\' 6"  (1.676  m)   Wt 146 lb 1.9 oz (66.3 kg)   SpO2 98%   BMI 23.58 kg/m   BMI: Body mass index is 23.58 kg/m. GEN: Well nourished, well developed F, in no acute distress  HEENT: normocephalic, atraumatic Neck: no JVD, carotid bruits, or masses Cardiac: RRR; no murmurs, rubs, or gallops, no edema  Respiratory:  clear to auscultation bilaterally, normal work of breathing GI: soft, nontender, nondistended, + BS MS: no deformity or atrophy  Skin: warm and dry, no rash Neuro:  Alert and Oriented x 3, Strength and sensation are intact, follows commands Psych: euthymic mood, somewhat anxious affect  Wt Readings from Last 3 Encounters:  02/26/16 146 lb 1.9 oz (66.3 kg)  01/30/16 143 lb (64.9 kg)  01/23/16 144 lb (65.3 kg)      Studies/Labs Reviewed:   EKG:  EKG was ordered today and personally reviewed by me and demonstrates NSR 91bpm, RSR V1, LVH, TWI I, avL= similar to previous.  Recent Labs: 09/20/2015: Hemoglobin 14.2; Platelets 225; TSH 1.52 01/10/2016: ALT 12; BUN 10; Creat 0.72; Potassium 4.1; Sodium 141   Lipid Panel    Component Value Date/Time   CHOL 186 01/10/2016 1701   TRIG 116 01/10/2016 1701   HDL 55 01/10/2016 1701   CHOLHDL 3.4 01/10/2016 1701   VLDL 23 01/10/2016 1701   LDLCALC 108 (H) 01/10/2016 1701    Additional studies/ records that were reviewed today include: Summarized above.    ASSESSMENT & PLAN:   1. Chest discomfort and equivocal stress test - atypical discomfort. Symptoms are suspicious for GERD, but we do still have the knowledge of her recent abnormal stress test. Dr. Irish Lack has suspected this is a false positive. Case was discussed with Dr. Johnsie Cancel in clinic today as Dr. Irish Lack is out. Per discussion with MD, will start baby ASA, PPI, and PRN SL NTG (with hold parameters for BP). We again introduced the idea of cardiac CT to the patient but she does not wish to proceed at this time so will try to move stress echo up. (She also reports a family member  had a cardiac cath and does not wish to pursue this unless absolutely necessary.) Check labs including CBC, CMET, lipase, and stat troponin. If troponin is abnormal will need to call patient to have her proceed to the hospital for further evaluation. If it is negative will continue with OP workup as planned. ER precautions reviewed with patient.  2. Fatigue - ultimately if cardiac workup is unremarkable, refer back to PCP to consider w/u for adrenal insufficiency as cause for symptoms given chronically low BP. Alternatively she reports significant stress at home and this could be contributing as well as a diagnosis of exclusion. 3. Mitral regurg (  mild in 2014) - no significant murmur on exam.  4. Anemia - recheck CBC today to see if it is contributing to her fatigue.  Disposition: Will move follow-up up - f/u with Dr. Roney Marion team APP after stress test.  Medication Adjustments/Labs and Tests Ordered: Current medicines are reviewed at length with the patient today.  Concerns regarding medicines are outlined above. Medication changes, Labs and Tests ordered today are summarized above and listed in the Patient Instructions accessible in Encounters.   Raechel Ache PA-C  02/26/2016 3:01 PM    [facility] [place], [place], [place]  [address] Phone: [phone]; Fax: [phone]

## 2016-02-26 NOTE — Telephone Encounter (Signed)
Plan was to eventually have stress echo.  I think sx are atypical and abnormal ETT was still likely a false positive.

## 2016-02-26 NOTE — Telephone Encounter (Signed)
Dr. Irish Lack not in office today. I reviewed with Melina Copa, PA and she can see pt today.  I spoke with pt and made appt for her to see Melina Copa, PA today at 2.30

## 2016-02-27 LAB — BASIC METABOLIC PANEL
BUN / CREAT RATIO: 13 (ref 9–23)
BUN: 9 mg/dL (ref 6–24)
CALCIUM: 9.4 mg/dL (ref 8.7–10.2)
CO2: 22 mmol/L (ref 18–29)
Chloride: 104 mmol/L (ref 96–106)
Creatinine, Ser: 0.69 mg/dL (ref 0.57–1.00)
GFR, EST AFRICAN AMERICAN: 122 mL/min/{1.73_m2} (ref >59)
GFR, EST NON AFRICAN AMERICAN: 106 mL/min/{1.73_m2} (ref >59)
Glucose: 93 mg/dL (ref 65–99)
POTASSIUM: 4 mmol/L (ref 3.5–5.2)
SODIUM: 143 mmol/L (ref 134–144)

## 2016-02-27 LAB — LIPASE: LIPASE: 32 U/L (ref 14–72)

## 2016-02-27 LAB — CBC WITH DIFFERENTIAL/PLATELET
BASOS: 0 %
Basophils Absolute: 0 10*3/uL (ref 0.0–0.2)
EOS (ABSOLUTE): 0.1 10*3/uL (ref 0.0–0.4)
Eos: 1 %
HEMOGLOBIN: 12.3 g/dL (ref 11.1–15.9)
Hematocrit: 36.7 % (ref 34.0–46.6)
IMMATURE GRANS (ABS): 0 10*3/uL (ref 0.0–0.1)
Immature Granulocytes: 0 %
LYMPHS ABS: 1.1 10*3/uL (ref 0.7–3.1)
LYMPHS: 18 %
MCH: 28.8 pg (ref 26.6–33.0)
MCHC: 33.5 g/dL (ref 31.5–35.7)
MCV: 86 fL (ref 79–97)
Monocytes Absolute: 0.6 10*3/uL (ref 0.1–0.9)
Monocytes: 10 %
NEUTROS ABS: 4.2 10*3/uL (ref 1.4–7.0)
Neutrophils: 71 %
PLATELETS: 222 10*3/uL (ref 150–379)
RBC: 4.27 x10E6/uL (ref 3.77–5.28)
RDW: 12.7 % (ref 12.3–15.4)
WBC: 5.9 10*3/uL (ref 3.4–10.8)

## 2016-02-27 LAB — HEPATIC FUNCTION PANEL
ALK PHOS: 41 IU/L (ref 39–117)
ALT: 11 IU/L (ref 0–32)
AST: 12 IU/L (ref 0–40)
Albumin: 4.3 g/dL (ref 3.5–5.5)
Bilirubin Total: 0.3 mg/dL (ref 0.0–1.2)
Bilirubin, Direct: 0.08 mg/dL (ref 0.00–0.40)
Total Protein: 6.4 g/dL (ref 6.0–8.5)

## 2016-02-27 LAB — TROPONIN I

## 2016-03-03 ENCOUNTER — Ambulatory Visit (HOSPITAL_COMMUNITY)
Admission: RE | Admit: 2016-03-03 | Discharge: 2016-03-03 | Disposition: A | Discharge status: Home / Self Care | Payer: Managed Care, Other (non HMO) | Source: Ambulatory Visit | Attending: Family | Admitting: Family

## 2016-03-03 DIAGNOSIS — R000 Tachycardia, unspecified: Secondary | ICD-10-CM | POA: Diagnosis not present

## 2016-03-03 DIAGNOSIS — R9439 Abnormal result of other cardiovascular function study: Secondary | ICD-10-CM | POA: Diagnosis present

## 2016-03-03 LAB — ECHOCARDIOGRAM STRESS TEST
CHL CUP MPHR: 176 {beats}/min
CHL CUP RESTING HR STRESS: 93 {beats}/min
CHL RATE OF PERCEIVED EXERTION: 14
CSEPED: 7 min
Estimated workload: 8.9 METS
Exercise duration (sec): 35 s
Peak HR: 184 {beats}/min
Percent HR: 104 %

## 2016-03-03 NOTE — Progress Notes (Signed)
  Echocardiogram Echocardiogram Stress Test has been performed.  Joanna Yang 03/03/2016, 2:18 PM

## 2016-03-04 ENCOUNTER — Telehealth: Payer: Self-pay | Admitting: Interventional Cardiology

## 2016-03-04 ENCOUNTER — Telehealth: Payer: Self-pay | Admitting: Certified Nurse Midwife

## 2016-03-04 NOTE — Telephone Encounter (Signed)
Spoke with patient. Patient asking if she needs 6 week breast recheck appt even thought MMG was normal? Advised patient she does still need 6 week recheck with provider. Patient verbalizes understanding and is agreeable. Patient will keep appt 03/06/16 @10 :30am with Melvia Heaps, CNM.  Routing to provider for final review. Patient is agreeable to disposition. Will close encounter.

## 2016-03-04 NOTE — Telephone Encounter (Signed)
Patient has an appointment for a 6 week breast check 03/06/16. Patient thinks she may not need this appointment and would like to talk with a nurse before canceling this appointment.

## 2016-03-04 NOTE — Telephone Encounter (Signed)
The pt is advised that Dr Irish Lack has not reviewed her Echo Stress results yet and that she is scheduled to see Ellen Henri, PA-c tomorrow to go over her results. She verbalized understanding.

## 2016-03-04 NOTE — Telephone Encounter (Signed)
°  Follow Up ° °Calling to follow up on test results. Please call. °

## 2016-03-05 ENCOUNTER — Encounter (INDEPENDENT_AMBULATORY_CARE_PROVIDER_SITE_OTHER): Payer: Self-pay

## 2016-03-05 ENCOUNTER — Ambulatory Visit: Payer: Managed Care, Other (non HMO) | Admitting: Cardiology

## 2016-03-05 ENCOUNTER — Ambulatory Visit (INDEPENDENT_AMBULATORY_CARE_PROVIDER_SITE_OTHER): Payer: Managed Care, Other (non HMO) | Admitting: Cardiology

## 2016-03-05 ENCOUNTER — Encounter: Payer: Self-pay | Admitting: Cardiology

## 2016-03-05 VITALS — BP 100/70 | HR 88 | Ht 66.0 in | Wt 145.0 lb

## 2016-03-05 DIAGNOSIS — R079 Chest pain, unspecified: Secondary | ICD-10-CM | POA: Diagnosis not present

## 2016-03-05 NOTE — Progress Notes (Signed)
03/05/2016 [name]   19-May-1970  [id number]  Primary Physician Eloise Levels, NP Primary Cardiologist: Dr. Irish Lack    Reason for Visit/CC: Chest Pain  HPI:  Joanna Yang is a 46 y.o. female with history of anemia, anxiety, prior benign heart murmur during pregnancy who presents back to clinic for evaluation of chest discomfort. She was seen by Dr. Irish Lack in 01/2016 for complaints of fatigue for several months. She had also had some exertional dyspnea. She has been under a lot of stress at home. She had a son with autism spectrum disorder with very restricted food choices. (She had these sx several years ago when in a [place]. An ETT was recommended but she did not have this done at that time.) Prior 2D echo in 2014 showed technically limited study, EF 60%, mild MR, trace TR.  Dr. Irish Lack arranged ETT 02/12/16 which showed upsloped 69mm ST depression in V2 & V5 that resolved 1 minute into recovery, inconclusive. He subsequently recommended stress echo to further evaluate. (Coronary CTA was initial test of choice but patient wished to avoid radiation as she has had several scans as a child due to recurrent pneumonia.) Last labs 12/2015: K 4.1, Cr 0.72, LDL 108, glucose WNL. TSH and CBC nl in 08/2015.   Given her abnormal ETT, she was set of for a stress echo at [facility]. This was performed on 03/03/16 and showed electrically abnormal treadmill stress electrocardiogram suggesetive of ischemia, however, normal stress echocardiographic  images. EF 55-60% with normal wall motion.  Fair exercise tolerance with normal functional capacity. No chest pain was reported. Findings are equivocal. Additional perfusion testing may be warranted. These results have not yet been reviewed by Dr. Irish Lack.   She notes that she has continued to have intermitted chest discomfort at rest. Symptoms feel like chest tightness/ indigestion. Can last up to 10 min and resolve spontaneously. No associated dyspnea. She was  started on Prilosec 1 week ago and has notice some improvement. She was advised to start baby ASA at her last OV but wants to avoid daily use of ASA given side effects that she read about online. She is not interested in increasing her PPI at this time. She plans to f/u with her gastroenterologist. She is a Research officer, [employer]. She wants reassurance that it is not her heart.     Current Meds  Medication Sig  . omeprazole (PRILOSEC) 20 MG capsule Take 1 capsule (20 mg total) by mouth daily.  . polyethylene glycol (MIRALAX / GLYCOLAX) packet Take 17 g by mouth daily. Takes 1/2 capful daily   No Known Allergies Past Medical History:  Diagnosis Date  . Anemia   . Anxiety   . Fibroid   . Heart murmur    pt. thinks she has a benign murmur dx'd during pregnancy  . Mitral regurgitation    a. Prior 2D echo in 2014 showed technically limited study, EF 60%, mild MR, trace TR.   Family History  Problem Relation Age of Onset  . Hypertension Mother   . Arrhythmia Mother   . Depression Mother   . Heart disease Mother   . Heart failure Mother   . Hypertension Father   . Cancer Paternal Grandmother     tumor in spine  . Dementia Paternal Grandfather    Past Surgical History:  Procedure Laterality Date  . CESAREAN SECTION  2009  . TUBAL LIGATION  2009   Social History   Social History  . Marital status: Married  Spouse name: N/A  . Number of children: N/A  . Years of education: N/A   Occupational History  . Not on file.   Social History Main Topics  . Smoking status: Never Smoker  . Smokeless tobacco: Never Used  . Alcohol use No  . Drug use: No  . Sexual activity: Yes    Partners: Male    Birth control/ protection: Surgical     Comment: Tubal   Other Topics Concern  . Not on file   Social History Narrative  . No narrative on file     Review of Systems: General: negative for chills, fever, night sweats or weight changes.  Cardiovascular: negative for chest pain, dyspnea on  exertion, edema, orthopnea, palpitations, paroxysmal nocturnal dyspnea or shortness of breath Dermatological: negative for rash Respiratory: negative for cough or wheezing Urologic: negative for hematuria Abdominal: negative for nausea, vomiting, diarrhea, bright red blood per rectum, melena, or hematemesis Neurologic: negative for visual changes, syncope, or dizziness All other systems reviewed and are otherwise negative except as noted above.   Physical Exam:  Blood pressure 100/70, pulse 88, height 5\' 6"  (1.676 m), weight 145 lb (65.8 kg), last menstrual period 02/15/2016.  General appearance: alert, cooperative and no distress Neck: no carotid bruit and no JVD Lungs: clear to auscultation bilaterally Heart: regular rate and rhythm, S1, S2 normal, no murmur, click, rub or gallop Extremities: extremities normal, atraumatic, no cyanosis or edema Pulses: 2+ and symmetric Skin: Skin color, texture, turgor normal. No rashes or lesions Neurologic: Grossly normal  EKG not performed  Stress Echo 03/03/16 Study Conclusions  - Baseline ECG: Normal sinus rhythm. - Stress ECG conclusions: Sinus tachycardia with 2-3 mm horizontal   to upsloping ST segment depression in II, III, AVF and V3-V6 at   peak stress. - Recovery: LVEF 60-65%, normal wall motion and thickening. - Peak stress: Expected hyperdynamic increase in LV function with   LVEF of 75-80%, normal wall motion and thickening. - Baseline: Normal LVEF of 55-60%, normal wall motion and   thickening.  Impressions:  - Electrically abnormal treadmill stress electrocardiogram   suggesetive of ischemia, however, normal stress echocardiographic   images. Fair exercise tolerance with normal functional capacity.   No chest pain was reported. Findings are equivocal. Additional   perfusion testing may be warranted.    ASSESSMENT AND PLAN:   1. Chest Pain: ? Cardiac vs GI related CP. Initial ETT was abnormal with 1 mm STD in the  lateral leads that resolve 1 minute into recovery. Additional testing with coronary CTA was recommended, however patient voiced that she wished to avoid radiation as she has had several scans as a child due to recurrent pneumonia. She was subsequently set up for a stress echo. This was considered an equivocal study. She was noted to have electrically abnormal treadmill stress electrocardiogram suggesetive of ischemia, however, normal stress echocardiographic images. I was present with her during her test. Her target HR was 149 bpm and she was able to exercise up to 170 bpm w/o exertional chest pain or dyspnea. Given lack of symptoms and lack of abnormalities on echo, I suspect that her treadmill test is false positive and suspect her CP is most likely from a GI etiology. She has had some improvement with Prilosec and just started this 1 week ago. She worries a lot and is wanting assurance that she does not have any cardiac issues. We will have Dr. Irish Lack review results of stress echo to weigh in on need  for additional testing. The patient notes that she would now be willing to proceed with a Coronary CTA, despite radiation exposure, if Dr. Irish Lack feels that it is needed. We will await Dr. Hassell Done recommendations. She will also plan to f/u with her gastroenterologist.     Lyda Jester PA-C 03/05/2016 10:56 AM

## 2016-03-05 NOTE — Patient Instructions (Signed)
Medication Instructions:  Your physician recommends that you continue on your current medications as directed. Please refer to the Current Medication list given to you today.   Labwork: NONE  Testing/Procedures: NONE  Follow-Up: DR. VARANASI; WE WILL CALL YOU WITH AN APPT  Any Other Special Instructions Will Be Listed Below (If Applicable).     If you need a refill on your cardiac medications before your next appointment, please call your pharmacy.

## 2016-03-06 ENCOUNTER — Encounter: Payer: Self-pay | Admitting: Certified Nurse Midwife

## 2016-03-06 ENCOUNTER — Ambulatory Visit (INDEPENDENT_AMBULATORY_CARE_PROVIDER_SITE_OTHER): Payer: Managed Care, Other (non HMO) | Admitting: Certified Nurse Midwife

## 2016-03-06 VITALS — BP 96/60 | HR 68 | Temp 97.7°F | Resp 16 | Ht 65.5 in | Wt 145.0 lb

## 2016-03-06 DIAGNOSIS — Z1231 Encounter for screening mammogram for malignant neoplasm of breast: Secondary | ICD-10-CM

## 2016-03-06 DIAGNOSIS — M549 Dorsalgia, unspecified: Secondary | ICD-10-CM | POA: Diagnosis not present

## 2016-03-06 DIAGNOSIS — Z1239 Encounter for other screening for malignant neoplasm of breast: Secondary | ICD-10-CM

## 2016-03-06 LAB — POCT URINALYSIS DIPSTICK
BILIRUBIN UA: NEGATIVE
GLUCOSE UA: NEGATIVE
Ketones, UA: NEGATIVE
Leukocytes, UA: NEGATIVE
NITRITE UA: NEGATIVE
Protein, UA: NEGATIVE
RBC UA: NEGATIVE
Urobilinogen, UA: NEGATIVE
pH, UA: 5

## 2016-03-06 NOTE — Progress Notes (Signed)
   Subjective:   46 y.o. Married [place] female presents for follow up breast exam of ? Mass vs fibroglandular tissue. Patient had diagnostic mammogram 01/13/16 and 01/31/16 with [place] of right breast with no malignant or concerning findings. Both areas of concern were targeted with [place] with no usual findings. Patient has not be able to feel area of concern, since mammogram. Patient due for yearly screening in 3/18, but is going to wait until aex here with Dr. [name] and have in 7/18 unless she notes a change in breast and will come in.  She also has been carrying a 17 lb. Puppy around and has noted some muscular pain of back. ? UTI requests urine check. Denies any urinary symptoms, fever, chills. Just feels sore after carrying puppy. No other health issues today.  Review of Systems Pertinent items are noted in HPI.  Objective:   General appearance: alert, cooperative, appears stated age and no distress  Back: no tenderness to percussion or palpation, symmetric, no curvature. ROM normal. No CVA tenderness.  Breasts: normal appearance, no masses or tenderness, No nipple retraction or dimpling, No nipple discharge or bleeding, No axillary or supraclavicular adenopathy, fibrogalndular feel bilateral, no masses noted.   Assessment:   ASSESSMENT:Patient is diagnosed with normal breast exam and Fibroglandular feel.  Muscular back discomfort from carrying puppy.  Plan:   PLAN: The patient will continue monthly SBE and report any changes. She will have yearly mammogram after her aex 7/18 and assessed per Dr. [name]( patient preference). Discussed heat or ice to area of back and decrease lifting of puppy unless needed. Will follow up with orthopedic or PCP if continues.   Rv prn

## 2016-03-06 NOTE — Patient Instructions (Signed)
Back Pain, Adult Introduction Back pain is very common. The pain often gets better over time. The cause of back pain is usually not dangerous. Most people can learn to manage their back pain on their own. Follow these instructions at home: Watch your back pain for any changes. The following actions may help to lessen any pain you are feeling:  Stay active. Start with short walks on flat ground if you can. Try to walk farther each day.  Exercise regularly as told by your doctor. Exercise helps your back heal faster. It also helps avoid future injury by keeping your muscles strong and flexible.  Do not sit, drive, or stand in one place for more than 30 minutes.  Do not stay in bed. Resting more than 1-2 days can slow down your recovery.  Be careful when you bend or lift an object. Use good form when lifting:  Bend at your knees.  Keep the object close to your body.  Do not twist.  Sleep on a firm mattress. Lie on your side, and bend your knees. If you lie on your back, put a pillow under your knees.  Take medicines only as told by your doctor.  Put ice on the injured area.  Put ice in a plastic bag.  Place a towel between your skin and the bag.  Leave the ice on for 20 minutes, 2-3 times a day for the first 2-3 days. After that, you can switch between ice and heat packs.  Avoid feeling anxious or stressed. Find good ways to deal with stress, such as exercise.  Maintain a healthy weight. Extra weight puts stress on your back. Contact a doctor if:  You have pain that does not go away with rest or medicine.  You have worsening pain that goes down into your legs or buttocks.  You have pain that does not get better in one week.  You have pain at night.  You lose weight.  You have a fever or chills. Get help right away if:  You cannot control when you poop (bowel movement) or pee (urinate).  Your arms or legs feel weak.  Your arms or legs lose feeling  (numbness).  You feel sick to your stomach (nauseous) or throw up (vomit).  You have belly (abdominal) pain.  You feel like you may pass out (faint). This information is not intended to replace advice given to you by your health care provider. Make sure you discuss any questions you have with your health care provider. Document Released: 07/29/2007 Document Revised: 07/18/2015 Document Reviewed: 06/13/2013  2017 Elsevier Breast Self-Awareness Introduction Breast self-awareness means:  Knowing how your breasts look.  Knowing how your breasts feel.  Checking your breasts every month for changes.  Telling your doctor if you notice a change in your breasts. Breast self-awareness allows you to notice a breast problem early while it is still small. How to do a breast self-exam One way to learn what is normal for your breasts and to check for changes is to do a breast self-exam. To do a breast self-exam: Look for Changes  1. Take off all the clothes above your waist. 2. Stand in front of a mirror in a room with good lighting. 3. Put your hands on your hips. 4. Push your hands down. 5. Look at your breasts and nipples in the mirror to see if one breast or nipple looks different than the other. Check to see if:  The shape of one breast is  different.  The size of one breast is different.  There are wrinkles, dips, and bumps in one breast and not the other. 6. Look at each breast for changes in your skin, such as:  Redness.  Scaly areas. 7. Look for changes in your nipples, such as:  Liquid around the nipples.  Bleeding.  Dimpling.  Redness.  A change in where the nipples are. Feel for Changes 1. Lie on your back on the floor. 2. Feel each breast. To do this, follow these steps:  Pick a breast to feel.  Put the arm closest to that breast above your head.  Use your other arm to feel the nipple area of your breast. Feel the area with the pads of your three middle  fingers by making small circles with your fingers. For the first circle, press lightly. For the second circle, press harder. For the third circle, press even harder.  Keep making circles with your fingers at the light, harder, and even harder pressures as you move down your breast. Stop when you feel your ribs.  Move your fingers a little toward the center of your body.  Start making circles with your fingers again, this time going up until you reach your collarbone.  Keep making up and down circles until you reach your armpit. Remember to keep using the three pressures.  Feel the other breast in the same way. 3. Sit or stand in the shower or tub. 4. With soapy water on your skin, feel each breast the same way you did in step 2, when you were lying on the floor. Write Down What You Find  After doing the self-exam, write down:  What is normal for each breast.  Any changes you find in each breast.  When you last had your period. How often should I check my breasts? Check your breasts every month. If you are breastfeeding, the best time to check them is after you feed your baby or after you use a breast pump. If you get periods, the best time to check your breasts is 5-7 days after your period is over. When should I see my doctor? See your doctor if you notice:  A change in shape or size of your breasts or nipples.  A change in the skin of your breast or nipples, such as red or scaly skin.  Unusual fluid coming from your nipples.  A lump or thick area that was not there before.  Pain in your breasts.  Anything that concerns you. This information is not intended to replace advice given to you by your health care provider. Make sure you discuss any questions you have with your health care provider. Document Released: 07/29/2007 Document Revised: 07/18/2015 Document Reviewed: 12/30/2014  2017 Elsevier

## 2016-03-07 NOTE — Progress Notes (Signed)
Encounter reviewed Jill Jertson, MD   

## 2016-03-09 ENCOUNTER — Telehealth: Payer: Self-pay | Admitting: *Deleted

## 2016-03-09 NOTE — Telephone Encounter (Signed)
Pt notified of Stress Echo results by phone with verbal understanding. Pt states she still has some chest discomfort however; she is seeing GI tomorrow for further evaluation of he chest discomfort. Pt asked for a copy of her test results to be mailed to her. I verified pt's address and mailed a copy of results.

## 2016-03-13 ENCOUNTER — Telehealth: Payer: Self-pay | Admitting: Interventional Cardiology

## 2016-03-13 NOTE — Telephone Encounter (Signed)
Patient notified that Dr. Irish Lack does not think her chest pain is cardiac and that he does not recommend further testing at this time. It was explained to the patient that the only test left to do would be a coronary angiogram which would be invasive and involve radiation and that Dr. Irish Lack does not think it is warranted at this time. Patient verbalized understanding.

## 2016-03-13 NOTE — Telephone Encounter (Signed)
Left message to call back  

## 2016-03-13 NOTE — Telephone Encounter (Signed)
Ms. Marina Gravel is requesting a call back. Thanks.

## 2016-03-13 NOTE — Telephone Encounter (Signed)
I think her chest pain is not cardiac.  I would not recommend further testing at this time.  THe only test left would be a coronary angiogram which would be invasive and involve radiation.  At this time, I don't think it is warranted.

## 2016-03-13 NOTE — Telephone Encounter (Signed)
Left message for patient to call back  

## 2016-03-13 NOTE — Telephone Encounter (Signed)
Follow Up:; ° ° °Returning your call. °

## 2016-03-13 NOTE — Telephone Encounter (Signed)
Patient called to inform us that she had to call 911 last night around 10 pm due to 8/10 Chest pain. The patient states that EMS wet out and did an EKG. She states that they told her she was not having a heart attack. The patient states that when she was experiencing chest pain that she had a panic attack which worsened her symptoms. The patient states that she took a baby aspirin while she was having symptoms. She refused to go with EMS to the hospital because she had no one to watch her children. The patient denies any chest pain at this time. The patient states that she is unable to come into the office because she is stuck in her driveway and her husband will not be home until 6 pm. The patient is requesting further testing and would like to see what  Dr. Hassell Done recommendations are regarding this continued chest discomfort. Patient had a normal stress echo on 03/03/16. Please advise.

## 2016-03-16 ENCOUNTER — Other Ambulatory Visit (HOSPITAL_COMMUNITY): Payer: Managed Care, Other (non HMO)

## 2016-04-06 ENCOUNTER — Ambulatory Visit: Payer: Self-pay | Admitting: Licensed Clinical Social Worker

## 2016-04-23 ENCOUNTER — Telehealth: Payer: Self-pay | Admitting: Interventional Cardiology

## 2016-04-23 NOTE — Telephone Encounter (Signed)
Patient has been taking Omeprazole, 20 mg for two months to rule out heart burn and would like to know when she can stop taking the medication. Please call to discuss,thanks.

## 2016-04-23 NOTE — Telephone Encounter (Signed)
**Note De-Identified Thaison Kolodziejski Obfuscation** The pt is advised per Ellen Henri, PA-c to discuss her need for Omeprazole with her Gastroenterologist because even though she was advised by [place] to start Omeprazole to see if it helps with her CP it is a stomach medication and that since she is now seeing a gastroenterologist to refer to them.  The pt verbalized understanding and is in agreement. She thanked me for my assistance.

## 2016-04-23 NOTE — Telephone Encounter (Signed)
The pt wants to stop taking Omeprazole or have dose lowered due to what she has read about side effects with taking medication long term. She states that she is unsure if Omeprazole has helped with her CP or not. She states that she is now seeing a gastroenterology and is scheduled to have a endoscopy on 3/7. She has also seen her PCP and is now taking Prozac for anxiety and feels that it maybe helping her CP but has just recently started taking on 04/03/16. She is advised that I will discuss with Ellen Henri, PA-c as she is the last provider that saw the pt in this office on 03/05/16 and I will call her back with Brittany's recommendations.

## 2016-06-25 ENCOUNTER — Ambulatory Visit: Payer: Self-pay | Admitting: Psychology

## 2016-07-16 ENCOUNTER — Ambulatory Visit: Payer: Self-pay | Admitting: Psychology

## 2016-08-19 ENCOUNTER — Ambulatory Visit: Payer: Managed Care, Other (non HMO) | Admitting: Psychology

## 2016-09-18 ENCOUNTER — Ambulatory Visit: Payer: Managed Care, Other (non HMO) | Admitting: Psychology

## 2016-10-01 NOTE — Progress Notes (Signed)
46 y.o. [id number] Married [place] female here for annual exam.    Suffered anxiety and chest pain last year.  Saw cardiology and had a normal work up. Omeprazole did not help symptoms as well so stopped this. Had an acute CP attack and called 911.  Eventually saw PCP who prescribed Prozac in February.  Feeling so much better and more calm now on 20 mg dosage.  Will start counseling also.  Having pain with intercourse recently. Decreased sexual interest.   Some abdominal bloating.   Some lower abdominal sensitivity when she leans on it.   Pelvic US Nov 2017 - 10 mm fibroid only.  Sinusitis.  Bruising.   Planning a trip to [place] next summer.  Will not do labs today.  PCP: [facility]  Patient's last menstrual period was 09/14/2016 (exact date).     Period Cycle (Days): 30 Period Duration (Days): 5-8 days Period Pattern: Regular Menstrual Flow:  (2nd and 3rd day heavy) Menstrual Control: Maxi pad Menstrual Control Change Freq (Hours): every 6 hours Dysmenorrhea: (!) Moderate Dysmenorrhea Symptoms: Cramping, Headache     Sexually active: Yes.   female The current method of family planning is tubal ligation.    Exercising: No.   Smoker:  no  Health Maintenance: Pap: 04-20-14 Neg:Neg HR HPV History of abnormal Pap:  No. MMG: Diag.Lt and U/S Nov/Dec 2017--See Epic: 05-02-15 3D/Density C/Neg/BiRads1:[facility] (pt.wants to wait to have screening 12/2016) Colonoscopy: 2011 inflammation in [place];had sigmoidoscopy 6 months later and negative. BMD:   n/a  Result  n/a TDaP: pt.thinks within last 10 years Gardasil:   n/a HIV: Neg Hep C: unsure Screening Labs:   --    reports that she has never smoked. She has never used smokeless tobacco. She reports that she does not drink alcohol or use drugs.  Past Medical History:  Diagnosis Date  . Anemia   . Anxiety   . Fibroid   . Heart murmur    pt. thinks she has a benign murmur dx'd during pregnancy  . Mitral  regurgitation    a. Prior 2D echo in 2014 showed technically limited study, EF 60%, mild MR, trace TR.    Past Surgical History:  Procedure Laterality Date  . CESAREAN SECTION  2009  . TUBAL LIGATION  2009    Current Outpatient Prescriptions  Medication Sig Dispense Refill  . cetirizine (ZYRTEC) 10 MG tablet Take 10 mg by mouth daily.    Marland Kitchen FLUoxetine (PROZAC) 20 MG capsule Take 1 capsule by mouth daily.  0  . polyethylene glycol (MIRALAX / GLYCOLAX) packet Take 17 g by mouth daily. Takes 1/2 capful daily     No current facility-administered medications for this visit.     Family History  Problem Relation Age of Onset  . Hypertension Mother   . Arrhythmia Mother   . Depression Mother   . Heart disease Mother   . Heart failure Mother   . Diabetes Mother   . Hypertension Father   . Cancer Paternal Grandmother        tumor in spine  . Dementia Paternal Grandfather     ROS:  Pertinent items are noted in HPI.  Otherwise, a comprehensive ROS was negative.  Exam:   BP 100/64 (BP Location: Right Arm, Patient Position: Sitting, Cuff Size: Small)   Pulse 80   Resp 14   Ht 5' 5.5" (1.664 m)   Wt 139 lb 3.2 oz (63.1 kg)   LMP 09/14/2016 (Exact Date)   BMI  22.81 kg/m     General appearance: alert, cooperative and appears stated age Head: Normocephalic, without obvious abnormality, atraumatic Neck: no adenopathy, supple, symmetrical, trachea midline and thyroid normal to inspection and palpation Lungs: clear to auscultation bilaterally Breasts: normal appearance, no masses or tenderness, No nipple retraction or dimpling, No nipple discharge or bleeding, No axillary or supraclavicular adenopathy Heart: regular rate and rhythm Abdomen: soft, non-tender; no masses, no organomegaly Extremities: extremities normal, atraumatic, no cyanosis or edema Skin: Skin color, texture, turgor normal. No rashes or lesions Lymph nodes: Cervical, supraclavicular, and axillary nodes normal. No  abnormal inguinal nodes palpated Neurologic: Grossly normal  Pelvic: External genitalia:  no lesions              Urethra:  normal appearing urethra with no masses, tenderness or lesions              Bartholins and Skenes: normal                 Vagina: normal appearing vagina with normal color and discharge, no lesions              Cervix: no lesions              Pap taken: No. Bimanual Exam:  Uterus:  normal size, contour, position, consistency, mobility, non-tender              Adnexa: no mass, fullness, tenderness              Rectal exam: Yes.  .  Confirms.              Anus:  normal sphincter tone, no lesions  Chaperone was present for exam.  Assessment:   Well woman visit with normal exam. Status post BTL.  Small fibroid. Dyspareunia just recently.  May be due to ovulation.  Anxiety improved with Prozac.  Decreased libido.    Plan: Mammogram screening discussed.  She will do at least in November. Recommended self breast awareness. Pap and HR HPV as above. Guidelines for Calcium, Vitamin D, regular exercise program including cardiovascular and weight bearing exercise. No labs today.  Has done complete labs within the last year.  I support the idea of counseling.  We discussed decreased libido due to Prozac. She will see a new PCP to discuss her Prozac but for now is doing really well on it.  Follow up annually and prn.   After visit summary provided.

## 2016-10-02 ENCOUNTER — Ambulatory Visit (INDEPENDENT_AMBULATORY_CARE_PROVIDER_SITE_OTHER): Payer: Managed Care, Other (non HMO) | Admitting: Obstetrics and Gynecology

## 2016-10-02 ENCOUNTER — Encounter: Payer: Self-pay | Admitting: Obstetrics and Gynecology

## 2016-10-02 VITALS — BP 100/64 | HR 80 | Resp 14 | Ht 65.5 in | Wt 139.2 lb

## 2016-10-02 DIAGNOSIS — Z01419 Encounter for gynecological examination (general) (routine) without abnormal findings: Secondary | ICD-10-CM | POA: Diagnosis not present

## 2016-10-02 NOTE — Patient Instructions (Signed)

## 2016-10-29 ENCOUNTER — Other Ambulatory Visit (INDEPENDENT_AMBULATORY_CARE_PROVIDER_SITE_OTHER): Payer: Managed Care, Other (non HMO)

## 2016-10-29 ENCOUNTER — Ambulatory Visit (INDEPENDENT_AMBULATORY_CARE_PROVIDER_SITE_OTHER): Payer: Managed Care, Other (non HMO) | Admitting: Internal Medicine

## 2016-10-29 ENCOUNTER — Encounter: Payer: Self-pay | Admitting: Internal Medicine

## 2016-10-29 VITALS — BP 104/60 | HR 72 | Ht 66.0 in | Wt 138.0 lb

## 2016-10-29 DIAGNOSIS — E559 Vitamin D deficiency, unspecified: Secondary | ICD-10-CM | POA: Diagnosis not present

## 2016-10-29 DIAGNOSIS — R0789 Other chest pain: Secondary | ICD-10-CM | POA: Diagnosis not present

## 2016-10-29 DIAGNOSIS — J309 Allergic rhinitis, unspecified: Secondary | ICD-10-CM | POA: Insufficient documentation

## 2016-10-29 DIAGNOSIS — R5382 Chronic fatigue, unspecified: Secondary | ICD-10-CM | POA: Diagnosis not present

## 2016-10-29 DIAGNOSIS — J3089 Other allergic rhinitis: Secondary | ICD-10-CM

## 2016-10-29 LAB — TSH: TSH: 1.16 u[IU]/mL (ref 0.35–4.50)

## 2016-10-29 LAB — SEDIMENTATION RATE: SED RATE: 6 mm/h (ref 0–20)

## 2016-10-29 LAB — NITRIC OXIDE: Nitric Oxide: 12

## 2016-10-29 LAB — VITAMIN D 25 HYDROXY (VIT D DEFICIENCY, FRACTURES): VITD: 29.12 ng/mL — AB (ref 30.00–100.00)

## 2016-10-29 NOTE — Patient Instructions (Addendum)
ICD-10-CM   1. Discomfort in chest R07.89   2. Chronic fatigue R53.82   3. Seasonal allergic rhinitis due to other allergic trigger J30.89   4. Vitamin D deficiency E55.9     Check TSH, Vit D,  Check IgE, and blood allergy panel Check Serum: ESR, ACE, ANA, DS-DNA, RF, anti-CCP, ssA, ssB, scl-70, RNP, Aldolase,   Will call with results to decide next step that might involve a CT scan chest (wil enquire on CT chest with contrast)
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
## 2016-10-30 LAB — RNP ANTIBODIES

## 2016-11-02 LAB — RHEUMATOID FACTOR

## 2016-11-02 LAB — RESPIRATORY ALLERGY PROFILE REGION II ~~LOC~~
Allergen, A. alternata, m6: <0.1 kU/L
Allergen, Cedar tree, t12: <0.1 kU/L
Allergen, Mouse Urine Protein, e78: <0.1 kU/L
Allergen, Mulberry, t76: <0.1 kU/L
Allergen, P. notatum, m1: <0.1 kU/L
Aspergillus fumigatus, m3: <0.1 kU/L
CLADOSPORIUM HERBARUM (M2) IGE: <0.1 kU/L
CLASS: 0
CLASS: 0
CLASS: 0
CLASS: 0
CLASS: 0
CLASS: 0
CLASS: 0
CLASS: 0
CLASS: 0
CLASS: 0
CLASS: 0
COMMON RAGWEED (SHORT) (W1) IGE: <0.1 kU/L
Cat Dander: <0.1 kU/L
Class: 0
Class: 0
Class: 0
Class: 0
Class: 0
Class: 0
Class: 0
Class: 0
Class: 0
Class: 0
Class: 0
Class: 0
Class: 0
D. farinae: <0.1 kU/L
IGE (IMMUNOGLOBULIN E), SERUM: 5 kU/L (ref <=114)
Rough Pigweed  IgE: <0.1 kU/L
Timothy Grass: <0.1 kU/L

## 2016-11-02 LAB — ALDOLASE: Aldolase: 2.8 U/L (ref <=8.1)

## 2016-11-02 LAB — SJOGRENS SYNDROME-A EXTRACTABLE NUCLEAR ANTIBODY: SSA (Ro) (ENA) Antibody, IgG: <1 AI

## 2016-11-02 LAB — ANTI-SCLERODERMA ANTIBODY: SCLERODERMA (SCL-70) (ENA) ANTIBODY, IGG: NEGATIVE AI

## 2016-11-02 LAB — ANGIOTENSIN CONVERTING ENZYME: ANGIOTENSIN-CONVERTING ENZYME: 22 U/L (ref 9–67)

## 2016-11-02 LAB — ANA: ANA: NEGATIVE

## 2016-11-02 LAB — INTERPRETATION:

## 2016-11-02 LAB — SJOGRENS SYNDROME-B EXTRACTABLE NUCLEAR ANTIBODY: SSB (LA) (ENA) ANTIBODY, IGG: NEGATIVE AI

## 2016-11-02 LAB — CYCLIC CITRUL PEPTIDE ANTIBODY, IGG

## 2016-11-02 LAB — ANTI-DNA ANTIBODY, DOUBLE-STRANDED: ds DNA Ab: 2 IU/mL

## 2016-11-04 ENCOUNTER — Other Ambulatory Visit: Payer: Self-pay | Admitting: Internal Medicine

## 2016-11-04 DIAGNOSIS — E559 Vitamin D deficiency, unspecified: Secondary | ICD-10-CM

## 2016-11-04 NOTE — Telephone Encounter (Signed)
  Let [name] know that all tests -a utommune, alllergy all normal except  1. IgE pending  2. Vit D - borderline low  Plan - vitamin D3 50,000 (50k) units once every 2 weeks x 12 weeks, then once a month on first of each month  - this cannot be vitamin d2. But has to to be Vit D3  - If pharmacy only has vitamin d2 let me know, then he should get replesta (spl vit d3) at same dose - replesta can be obtained by following instructions at www.replesta.com  - recheck levels in 2-3 months  - if fatigue - not going away with this then we should consider geetting imaging - I still have not gotten answer on the low dose CT from radiologist but will do next week  Please send message back to me  Dr. Brand Males, M.D., [facility].C.P Pulmonary and Critical Care Medicine Staff Physician [place] [facility] Pager: [phone], If no answer or between  15:00h - 7:00h: call [phone]  11/04/2016 10:59 PM

## 2016-11-05 MED ORDER — VITAMIN D3 1.25 MG (50000 UT) PO CAPS: ORAL_CAPSULE | ORAL | 0 refills | Status: DC

## 2016-11-05 NOTE — Telephone Encounter (Signed)
Spoke with pt letting her know the results of her Vit D level and that we were going to start her on Vitamin D3 50,000 units and that we would recheck the levels in 2-3 months. Pt expressed understanding.  Sent in Rx for vit D3 50000 units to pt's pharmacy.  Told pt we would contact her back once we had the rest of the results back.

## 2016-11-09 ENCOUNTER — Telehealth: Payer: Self-pay | Admitting: Obstetrics and Gynecology

## 2016-11-09 ENCOUNTER — Other Ambulatory Visit: Payer: Self-pay | Admitting: Obstetrics and Gynecology

## 2016-11-09 DIAGNOSIS — Z1239 Encounter for other screening for malignant neoplasm of breast: Secondary | ICD-10-CM

## 2016-11-09 NOTE — Telephone Encounter (Signed)
Spoke with patient, reports yellow/white "milk looking" left nipple discharge and itching on 9/15. States breast have always been "problematic and lumpy". Unsure if right breast has d/c, looked "wet when squeezing. Denies warmth, pain, or discoloration.  Recommended OV for further evaluation. Patient declined OV for 9/19 d/t spouses appt at [facility]. Patient request to schedule on 9/21. Scheduled for OV on 11/13/16 at 10:45am with Dr. [name]. Patient states is husbands appointment cancelled will call to see if earlier appt available. Advised patient would review with Dr. [name] and return call with any additional recommendations, patient is agreeable.   Routing to provider for final review. Patient is agreeable to disposition. Will close encounter.

## 2016-11-09 NOTE — Telephone Encounter (Signed)
Patient is having nipple discharge. Patient was unable to schedule her MMG due to this. Patient was told she would need an order for a diagnostic MMG.

## 2016-11-13 ENCOUNTER — Ambulatory Visit (INDEPENDENT_AMBULATORY_CARE_PROVIDER_SITE_OTHER): Payer: Managed Care, Other (non HMO) | Admitting: Obstetrics and Gynecology

## 2016-11-13 ENCOUNTER — Encounter: Payer: Self-pay | Admitting: Obstetrics and Gynecology

## 2016-11-13 VITALS — BP 100/60 | HR 90 | Ht 65.5 in | Wt 140.2 lb

## 2016-11-13 DIAGNOSIS — N6452 Nipple discharge: Secondary | ICD-10-CM

## 2016-11-13 NOTE — Progress Notes (Signed)
GYNECOLOGY  VISIT   HPI: 46 y.o.   Married  Caucasian/[place]ian female   [phone] with Patient's last menstrual period was 11/01/2016 (exact date).   here for bilateral nipple discharge on one occasion. Patient states this happened on 11-07-16 and she has some slight left rib discomfort.    Husband present for the entire visit.   Had spontaneous milky left nipple discharge and was able to express the milky discharge also from one specific area. This occurred only once. The right side she had some discharge but it was clear and spontaneous but did come out with stimulation.   Seen on 09/20/15 and reported long standing left breast issues including left nipple discharge.  Has had multiple mammograms and evaluations of the breasts in the past, and saw a breast surgeon in [place] for a left breast lump.  Normal TSH and Prolactin 09/19/16.  She had a normal diagnostic left breast mammogram and ultrasound on 01/13/16 in follow up to this. She had a secondary left breast US on 01/23/16 to recheck the left breast again, and this was normal.  Taking Prozac for anxiety.  Went from 10 - 20 mg earlier this year. Loosing weight.   GYNECOLOGIC HISTORY: Patient's last menstrual period was 11/01/2016 (exact date). Contraception:  Tubal Menopausal hormone therapy:  n/a Last mammogram:  Diag.Lt and U/S Nov/Dec 2017--See Epic: 05-02-15 3D/Density C/Neg/BiRads1:[facility] (pt.wants to wait to have screening 12/2016) Last pap smear: 04-20-14 Neg:Neg HR HPV        OB History    Gravida Para Term Preterm AB Living   3 2 2   1 2    SAB TAB Ectopic Multiple Live Births   1       2         Patient Active Problem List   Diagnosis Date Noted  . Discomfort in chest 10/29/2016  . Chronic fatigue 10/29/2016  . Allergic rhinitis due to allergen 10/29/2016  . Vitamin D deficiency 10/29/2016    Past Medical History:  Diagnosis Date  . Anemia   . Anxiety   . Fibroid   . Heart murmur    pt. thinks she has a benign  murmur dx'd during pregnancy  . Mitral regurgitation    a. Prior 2D echo in 2014 showed technically limited study, EF 60%, mild MR, trace TR.    Past Surgical History:  Procedure Laterality Date  . CESAREAN SECTION  2009  . TUBAL LIGATION  2009    Current Outpatient Prescriptions  Medication Sig Dispense Refill  . cetirizine (ZYRTEC) 10 MG tablet Take 10 mg by mouth as needed.     Marland Kitchen FLUoxetine (PROZAC) 20 MG capsule Take 1 capsule by mouth daily.  0  . polyethylene glycol (MIRALAX / GLYCOLAX) packet Take 17 g by mouth daily as needed. Takes 1/2 capful daily     . Cholecalciferol (VITAMIN D3) 50000 units CAPS Take one capsule every 2 weeks x 12 weeks then one capsule monthly the first of every month. Recheck vit d level 01/05/17. (Patient not taking: Reported on 11/13/2016) 12 capsule 0   No current facility-administered medications for this visit.      ALLERGIES: Sulfamethoxazole-trimethoprim  Family History  Problem Relation Age of Onset  . Hypertension Mother   . Arrhythmia Mother   . Depression Mother   . Heart disease Mother   . Heart failure Mother   . Diabetes Mother   . Hypertension Father   . Cancer Paternal Grandmother  tumor in spine  . Dementia Paternal Grandfather     Social History   Social History  . Marital status: Married    Spouse name: N/A  . Number of children: N/A  . Years of education: N/A   Occupational History  . housewife    Social History Main Topics  . Smoking status: Never Smoker  . Smokeless tobacco: Never Used  . Alcohol use No  . Drug use: No  . Sexual activity: Yes    Partners: Male    Birth control/ protection: Surgical     Comment: Tubal   Other Topics Concern  . Not on file   Social History Narrative   Lives with husband and children.    Housewife.     ROS:  Pertinent items are noted in HPI.  PHYSICAL EXAMINATION:    BP 100/60 (BP Location: Right Arm, Patient Position: Sitting, Cuff Size: Normal)   Pulse 90    Ht 5' 5.5" (1.664 m)   Wt 140 lb 3.2 oz (63.6 kg)   LMP 11/01/2016 (Exact Date)   BMI 22.98 kg/m     General appearance: alert, cooperative and appears stated age   Breasts: normal appearance, no masses or tenderness, No nipple retraction or dimpling, No nipple discharge or bleeding, No axillary or supraclavicular adenopathy.  Chaperone was present for exam.  ASSESSMENT  Bilateral nipple discharge.  I am unable to reproduce this today. On Prozac.  PLAN  Check TSH and prolactin.  I am expecting patient will have a bilateral dx mammogram and bilateral ultrasound.  I am not anticipating a ductogram.    An After Visit Summary was printed and given to the patient.  ___15___ minutes face to face time of which over 50% was spent in counseling.

## 2016-11-14 LAB — TSH: TSH: 1.21 u[IU]/mL (ref 0.450–4.500)

## 2016-11-14 LAB — PROLACTIN: Prolactin: 20.4 ng/mL (ref 4.8–23.3)

## 2016-11-17 ENCOUNTER — Telehealth: Payer: Self-pay | Admitting: Internal Medicine

## 2016-11-17 ENCOUNTER — Telehealth: Payer: Self-pay

## 2016-11-17 DIAGNOSIS — N6452 Nipple discharge: Secondary | ICD-10-CM

## 2016-11-17 NOTE — Telephone Encounter (Signed)
Spoke with patient. Results given. Patient verbalizes understanding. States since being seen she has had bilateral nipple discharge. Advised Dr.Silva recommends bilateral diagnostic imaging with bilateral ultrasounds. Patient is agreeable. Requests to only be seen for imaging when radiologist Everlean Alstrom, M.D. is there. Advised will contact the [facility] regarding scheduling and return call. Patient is agreeable.  Appointment scheduled for 12/01/2016 at 2 pm for bilateral diagnostic [facility] Dr.Jarosz will not be in the office all next week. Patient is notified of appointment date and time and is agreeable. Placed in mammogram hold.  Routing to provider for final review. Patient agreeable to disposition. Will close encounter.

## 2016-11-17 NOTE — Telephone Encounter (Signed)
Patient is aware of results. Patient states that she will call back to schedule an appointment.

## 2016-11-17 NOTE — Telephone Encounter (Signed)
Let Joanna Yang know allergy, autoimmune blood work is normal. Only thing is Vit D mildly low. I thought I did a phone note earlier but cannot find it. Not sure why. She can take Vit D replacement OTC for fatigue and we can see. Certainly in a month if fatigue and the pain do not go away we can discuss imaging  Plan Give fu in 2 months  Dr. Brand Males, M.D., [facility].C.P Pulmonary and Critical Care Medicine Staff Physician [place] [facility] Pager: [phone], If no answer or between  15:00h - 7:00h: call [phone]  11/17/2016 2:02 PM      Results for Joanna Yang, Joanna Yang (MRN [id number]) as of 11/17/2016 13:57  Ref. Range 10/29/2016 11:37  VITD Latest Ref Range: 30.00 - 100.00 ng/mL 29.12 (L)    Results for Joanna Yang, Joanna Yang (MRN [id number]) as of 11/17/2016 13:57  Ref. Range 10/29/2016 11:37  Sheep Sorrel IgE Latest Units: kU/L <0.10  Pecan/Hickory Tree IgE Latest Units: kU/L <0.10  [place] Grass Latest Units: kU/L <0.10  Cat Dander Latest Units: kU/L <0.10  Cockroach Latest Units: kU/L <0.10  D. farinae Latest Units: kU/L <0.10  Dog Dander Latest Units: kU/L <0.10  Johnson Grass Latest Units: kU/L <0.10  Timothy Grass Latest Units: kU/L <0.10  Box Elder IgE Latest Units: kU/L <0.10  Elm IgE Latest Units: kU/L <0.10  Rough Pigweed  IgE Latest Units: kU/L <0.10  Allergen, D pternoyssinus,d7 Latest Units: kU/L <0.10  Anit Nuclear Antibody(ANA) Latest Ref Range: NEGATIVE  NEGATIVE  Angiotensin-Converting Enzyme Latest Ref Range: 9 - 67 U/L 22  Cyclic Citrullin Peptide Ab Latest Units: UNITS <16  ds DNA Ab Latest Units: IU/mL 2  ENA RNP Ab Latest Ref Range: 0.0 - 0.9 AI <0.2  RA Latex Turbid. Latest Ref Range: <14 IU/mL <14  IgE (Immunoglobulin E), Serum Latest Ref Range: <OR=114 kU/L 5  SSA (Ro) (ENA) Antibody, IgG Latest Ref Range: <1.0 NEG AI <1.0 NEG  SSB (La) (ENA) Antibody, IgG Latest Ref Range: <1.0 NEG AI <1.0 NEG  Scleroderma (Scl-70)  (ENA) Antibody, IgG Latest Ref Range: <1.0 NEG AI <1.0 NEG

## 2016-11-17 NOTE — Telephone Encounter (Signed)
-----   Message from Nunzio Cobbs, MD sent at 11/16/2016 11:57 AM EDT ----- Please report normal TSH and Prolactin to the patient.  Please schedule a bilateral diagnostic mammogram and [facility] for bilateral nipple discharge which was not reproducible on exam in the office.

## 2016-12-01 ENCOUNTER — Other Ambulatory Visit: Payer: Self-pay

## 2016-12-02 ENCOUNTER — Ambulatory Visit
Admission: RE | Admit: 2016-12-02 | Discharge: 2016-12-02 | Disposition: A | Discharge status: Home / Self Care | Payer: Managed Care, Other (non HMO) | Source: Ambulatory Visit | Attending: Obstetrics and Gynecology | Admitting: Obstetrics and Gynecology

## 2016-12-02 DIAGNOSIS — N6452 Nipple discharge: Secondary | ICD-10-CM

## 2016-12-11 DIAGNOSIS — N390 Urinary tract infection, site not specified: Secondary | ICD-10-CM | POA: Insufficient documentation

## 2017-01-22 ENCOUNTER — Encounter: Payer: Self-pay | Admitting: Internal Medicine

## 2017-01-22 ENCOUNTER — Other Ambulatory Visit (INDEPENDENT_AMBULATORY_CARE_PROVIDER_SITE_OTHER): Payer: Managed Care, Other (non HMO)

## 2017-01-22 ENCOUNTER — Ambulatory Visit (INDEPENDENT_AMBULATORY_CARE_PROVIDER_SITE_OTHER): Payer: Managed Care, Other (non HMO) | Admitting: Internal Medicine

## 2017-01-22 VITALS — BP 90/60 | HR 84 | Ht 66.0 in | Wt 141.6 lb

## 2017-01-22 DIAGNOSIS — R0789 Other chest pain: Secondary | ICD-10-CM

## 2017-01-22 DIAGNOSIS — E559 Vitamin D deficiency, unspecified: Secondary | ICD-10-CM

## 2017-01-22 DIAGNOSIS — R351 Nocturia: Secondary | ICD-10-CM

## 2017-01-22 DIAGNOSIS — R5382 Chronic fatigue, unspecified: Secondary | ICD-10-CM

## 2017-01-22 LAB — HEMOGLOBIN A1C: Hgb A1c MFr Bld: 5.6 % (ref 4.6–6.5)

## 2017-01-22 NOTE — Patient Instructions (Signed)
Vitamin D deficiency Chronic fatigue  - glad fatigue Is better with Vit D replacement and exercise - recheck vit d level 01/22/2017    Nocturia - new issue 01/22/2017 - check hgba1c  Discomfort in chest -glad improved with time and above as is cough  Followup  as needed

## 2017-01-22 NOTE — Progress Notes (Signed)
Subjective:     Patient ID: Joanna Yang, female   DOB: 1970/05/26, 46 y.o.   MRN: [id number]  HPI     PCP Patient, No Pcp Per   HPI  IOV 10/29/2016  Chief Complaint  Patient presents with  . Pulmonary Consult    Pt is self referred for chronic bronchitis. Pt states she has had several episodes of pna when young and had bronchitis as an adult. Pt c/o chest tightness at random times x 1 year. Pt denies recent f/c/s and significant cough at this time.    Joanna Yang presents with her husband Joanna Yang. She is originally from [place]. She is a mother of 2 sons Joanna Yang. Husband is an Chief Financial Officer. They relocated from [place] to [facility] 3 years ago. Her youngest son Joanna Yang has some level of autism spectrum disorder. 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 She tells me that since September 2017 she's had this chest discomfort in the retrosternal area radiating to the back. She says it is not a pain. The severity is mild to moderate it is present on most days if not all days. She sometimes feels that this is similar to pleurisy she had when she was younger. She also recollects a history of recurrent pneumonias during her childhood and results being treated by her grandparent was a doctor. In December 2017 based on her history and review of the chart She underwent extensive cardiac workup and it was all normal including stress test and echocardiogram.. Then sometime after this in January/0 2080 during a heavy snowstorm when her husband was traveling and she was alone at night with it. She woke up with sudden severe chest pain and palpitations and with the same chest discomfort but significantly worse. This made of electric pleurisy. She called EMS but apparently a trace monitor showed this was not a heart attack and she was reassured. Apparently the EMS suggest that she might have panic attack. 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 Prozac which  she says has helped her symptoms but they still persist. Symptoms are associated with significant amount of fatigue. There is some early morning cough with clearing of the throat but other than that there is no wheezing    Review of the chart shows she has not had a chest x-ray or CT scan of the chest. She is extremely reluctant to have one because of frequent mammograms and concern for radiation risk. She says it is fine to do blood test. She might be okay for low-dose CT scan.  She also says that after moving to [facility] she's had seasonal allergies. Her FeNO 10/29/2016 - 12 ppb  Results for Joanna, Yang (MRN [id number]) as of 10/29/2016 10:48  Ref. Range 09/20/2015 10:17 01/10/2016 17:01 02/26/2016 15:47  Creatinine Latest Ref Range: 0.57 - 1.00 mg/dL 0.74 0.72 0.69   Results for Joanna, Yang (MRN [id number]) as of 10/29/2016 10:48  Ref. Range 09/20/2015 10:17 01/10/2016 17:01 02/26/2016 15:47  EOS (ABSOLUTE) Latest Ref Range: 0.0 - 0.4 x10E3/uL   0.1   Results for Joanna, Yang (MRN [id number]) as of 10/29/2016 11:09  Ref. Range 09/20/2015 10:17 01/10/2016 17:01 02/26/2016 15:47  AST Latest Ref Range: 0 - 40 IU/L 13 13 12   ALT Latest Ref Range: 0 - 32 IU/L 12 12 11      has a past medical history of Anemia; Anxiety; Fibroid; Heart murmur; and Mitral regurgitation.   reports that she has never  smoked. She has never used smokeless tobacco   OV  01/22/2017  Chief Complaint  Patient presents with  . Follow-up    Pt states that she has been doing much better since last visit. States that very rarely she will have some pain chest but not like it was at last visit. States that she does have an occ. cough in the middle of night. Denies any CP.   Followup non specific chest pain with random occ cough with fatigue; autoimmune, allergy panel and IgE negative Followup vit d def discovered ater testing for above   Female 46 year old female for follow-up of nonspecific chest pain and fatigue: At last visit  was tested extensively.  Autoimmune, allergy panel and IgE negative.  Vitamin D was slightly low 29 units.  Will order with megadose vitamin D.  She tells me now that her fatigue has completely almost resolved.  In association she is also exercising.  This measure also has helped her.  She does have nonspecific chest pains but this also has improved.  She also is to have some random cough but this also has improved.  It appears the symptoms do correlate  temporarily with her son growing up and significant height and developing 3 cm pectus excavatum that has been evaluated at [facility] thoracic surgery department and he is currently on expectant follow-up.  Patient believes that vitamin D replacement is significantly helped her.  She is having a new complaint of nonspecific nocturia and wants a hemoglobin A1c checked.  Otherwise feeling good    has a past medical history of Anemia, Anxiety, Fibroid, Heart murmur, and Mitral regurgitation.   reports that  has never smoked. she has never used smokeless tobacco.  Past Surgical History:  Procedure Laterality Date  . CESAREAN SECTION  2009  . TUBAL LIGATION  2009    Allergies  Allergen Reactions  . Sulfamethoxazole-Trimethoprim Swelling    Immunization History  Administered Date(s) Administered  . Influenza-Unspecified 12/04/2015, 12/24/2016    Family History  Problem Relation Age of Onset  . Hypertension Mother   . Arrhythmia Mother   . Depression Mother   . Heart disease Mother   . Heart failure Mother   . Diabetes Mother   . Hypertension Father   . Cancer Paternal Grandmother        tumor in spine  . Dementia Paternal Grandfather      Current Outpatient Medications:  .  Cholecalciferol (VITAMIN D3) 50000 units CAPS, Take one capsule every 2 weeks x 12 weeks then one capsule monthly the first of every month. Recheck vit d level 01/05/17., Disp: 12 capsule, Rfl: 0 .  CRANBERRY EXTRACT PO, Take 2 capsules by mouth daily.,  Disp: , Rfl:  .  FLUoxetine (PROZAC) 20 MG capsule, Take 1 capsule by mouth daily., Disp: , Rfl: 0 .  polyethylene glycol (MIRALAX / GLYCOLAX) packet, Take 17 g by mouth daily as needed. Takes 1/2 capful daily , Disp: , Rfl:  .  Probiotic Product (PROBIOTIC DAILY PO), Take by mouth., Disp: , Rfl:    Review of Systems     Objective:   Physical Exam  Constitutional: She is oriented to person, place, and time. She appears well-developed and well-nourished. No distress.  HENT:  Head: Normocephalic and atraumatic.  Right Ear: External ear normal.  Left Ear: External ear normal.  Mouth/Throat: Oropharynx is clear and moist. No oropharyngeal exudate.  Eyes: Conjunctivae and EOM are normal. Pupils are equal, round, and reactive to light.  Right eye exhibits no discharge. Left eye exhibits no discharge. No scleral icterus.  Neck: Normal range of motion. Neck supple. No JVD present. No tracheal deviation present. No thyromegaly present.  Cardiovascular: Normal rate, regular rhythm, normal heart sounds and intact distal pulses. Exam reveals no gallop and no friction rub.  No murmur heard. Pulmonary/Chest: Effort normal and breath sounds normal. No respiratory distress. She has no wheezes. She has no rales. She exhibits no tenderness.  Abdominal: Soft. Bowel sounds are normal. She exhibits no distension and no mass. There is no tenderness. There is no rebound and no guarding.  Musculoskeletal: Normal range of motion. She exhibits no edema or tenderness.  Lymphadenopathy:    She has no cervical adenopathy.  Neurological: She is alert and oriented to person, place, and time. She has normal reflexes. No cranial nerve deficit. She exhibits normal muscle tone. Coordination normal.  Skin: Skin is warm and dry. No rash noted. She is not diaphoretic. No erythema. No pallor.  Psychiatric: She has a normal mood and affect. Her behavior is normal. Judgment and thought content normal.  Vitals  reviewed.  Vitals:   01/22/17 0904  BP: 90/60  Pulse: 84  SpO2: 100%  Weight: 141 lb 9.6 oz (64.2 kg)  Height: 5\' 6"  (1.676 m)        Assessment:       ICD-10-CM   1. Vitamin D deficiency E55.9 Vitamin D 1,25 dihydroxy  2. Chronic fatigue R53.82 HgB A1c  3. Nocturia R35.1 HgB A1c  4. Discomfort in chest R07.89        Plan:      Vitamin D deficiency Chronic fatigue  - glad fatigue Is better with Vit D replacement and exercise - recheck vit d level 01/22/2017    Nocturia - new issue 01/22/2017 - check hgba1c  Discomfort in chest -glad improved with time and above as is cough  Followup  as needed   Dr. Brand Males, M.D., [facility].C.P Pulmonary and Critical Care Medicine Staff Physician, [place] Director - Interstitial Lung Disease  Program  Pulmonary [place] at [place], [place], [address]  Pager: [phone], If no answer or between  15:00h - 7:00h: call [phone] Telephone: [phone]

## 2017-01-26 LAB — VITAMIN D 1,25 DIHYDROXY
Vitamin D 1, 25 (OH)2 Total: 45 pg/mL (ref 18–72)
Vitamin D3 1, 25 (OH)2: 45 pg/mL

## 2017-02-03 ENCOUNTER — Telehealth: Payer: Self-pay | Admitting: Internal Medicine

## 2017-02-03 NOTE — Telephone Encounter (Signed)
Vit D has normalized. She can move to taking the megadose 50K units just once a month till it runs out. After that followup and dosing to be done through PCP Patient, No Pcp Per - if she has no PCP  She should get one  Dr. Brand Males, M.D., [facility].C.P Pulmonary and Critical Care Medicine Staff Physician, [place] Director - Interstitial Lung Disease  Program  Pulmonary [place] at [place], [place], [address]  Pager: [phone], If no answer or between  15:00h - 7:00h: call [phone] Telephone: [phone]

## 2017-02-04 NOTE — Telephone Encounter (Signed)
Left message for pt to call us x1 to go over results of vitamin d level

## 2017-02-08 DIAGNOSIS — F411 Generalized anxiety disorder: Secondary | ICD-10-CM | POA: Diagnosis present

## 2017-02-12 NOTE — Telephone Encounter (Signed)
Called pt letting her know the results of her vitamin d level and for her to take the vitamin d 50K once a month until it runs out.  Pt stated to me that she goes to [facility] for a primary care doctor and either sees Dr. Melford Aase or one of the PA's there.  Stated to pt once she is done with the 50K vitamin D for her to make an appt with her primary care doctor to see what dosage of vitamin d they want her to go on since her level is normal.  Pt expressed understanding.  Nothing further needed.

## 2017-10-15 ENCOUNTER — Encounter: Payer: Self-pay | Admitting: Obstetrics and Gynecology

## 2017-10-15 ENCOUNTER — Other Ambulatory Visit (HOSPITAL_COMMUNITY)
Admission: RE | Admit: 2017-10-15 | Discharge: 2017-10-15 | Disposition: A | Discharge status: Home / Self Care | Payer: Managed Care, Other (non HMO) | Source: Ambulatory Visit | Attending: Obstetrics and Gynecology | Admitting: Obstetrics and Gynecology

## 2017-10-15 ENCOUNTER — Other Ambulatory Visit: Payer: Self-pay

## 2017-10-15 ENCOUNTER — Ambulatory Visit (INDEPENDENT_AMBULATORY_CARE_PROVIDER_SITE_OTHER): Payer: Managed Care, Other (non HMO) | Admitting: Obstetrics and Gynecology

## 2017-10-15 VITALS — BP 100/58 | HR 88 | Resp 16 | Ht 65.5 in | Wt 141.0 lb

## 2017-10-15 DIAGNOSIS — Z01419 Encounter for gynecological examination (general) (routine) without abnormal findings: Secondary | ICD-10-CM | POA: Insufficient documentation

## 2017-10-15 DIAGNOSIS — N6320 Unspecified lump in the left breast, unspecified quadrant: Secondary | ICD-10-CM

## 2017-10-15 MED ORDER — LEVONORGEST-ETH ESTRAD 91-DAY 0.15-0.03 MG PO TABS: 1.0000 | ORAL_TABLET | Freq: Every day | ORAL | 1 refills | Status: DC

## 2017-10-15 NOTE — Patient Instructions (Addendum)
EXERCISE AND DIET:  We recommended that you start or continue a regular exercise program for good health. Regular exercise means any activity that makes your heart beat faster and makes you sweat.  We recommend exercising at least 30 minutes per day at least 3 days a week, preferably 4 or 5.  We also recommend a diet low in fat and sugar.  Inactivity, poor dietary choices and obesity can cause diabetes, heart attack, stroke, and kidney damage, among others.    ALCOHOL AND SMOKING:  Women should limit their alcohol intake to no more than 7 drinks/beers/glasses of wine (combined, not each!) per week. Moderation of alcohol intake to this level decreases your risk of breast cancer and liver damage. And of course, no recreational drugs are part of a healthy lifestyle.  And absolutely no smoking or even second hand smoke. Most people know smoking can cause heart and lung diseases, but did you know it also contributes to weakening of your bones? Aging of your skin?  Yellowing of your teeth and nails?  CALCIUM AND VITAMIN D:  Adequate intake of calcium and Vitamin D are recommended.  The recommendations for exact amounts of these supplements seem to change often, but generally speaking 600 mg of calcium (either carbonate or citrate) and 800 units of Vitamin D per day seems prudent. Certain women may benefit from higher intake of Vitamin D.  If you are among these women, your doctor will have told you during your visit.    PAP SMEARS:  Pap smears, to check for cervical cancer or precancers,  have traditionally been done yearly, although recent scientific advances have shown that most women can have pap smears less often.  However, every woman still should have a physical exam from her gynecologist every year. It will include a breast check, inspection of the vulva and vagina to check for abnormal growths or skin changes, a visual exam of the cervix, and then an exam to evaluate the size and shape of the uterus and  ovaries.  And after 46 years of age, a rectal exam is indicated to check for rectal cancers. We will also provide age appropriate advice regarding health maintenance, like when you should have certain vaccines, screening for sexually transmitted diseases, bone density testing, colonoscopy, mammograms, etc.   MAMMOGRAMS:  All women over 40 years old should have a yearly mammogram. Many facilities now offer a "3D" mammogram, which may cost around $50 extra out of pocket. If possible,  we recommend you accept the option to have the 3D mammogram performed.  It both reduces the number of women who will be called back for extra views which then turn out to be normal, and it is better than the routine mammogram at detecting truly abnormal areas.    COLONOSCOPY:  Colonoscopy to screen for colon cancer is recommended for all women at age 50.  We know, you hate the idea of the prep.  We agree, BUT, having colon cancer and not knowing it is worse!!  Colon cancer so often starts as a polyp that can be seen and removed at colonscopy, which can quite literally save your life!  And if your first colonoscopy is normal and you have no family history of colon cancer, most women don't have to have it again for 10 years.  Once every ten years, you can do something that may end up saving your life, right?  We will be happy to help you get it scheduled when you are ready.    Be sure to check your insurance coverage so you understand how much it will cost.  It may be covered as a preventative service at no cost, but you should check your particular policy.     Ethinyl Estradiol; Levonorgestrel tablets What is this medicine? ETHINYL ESTRADIOL; LEVONORGESTREL (ETH in il es tra DYE ole; LEE voh nor jes trel) is an oral contraceptive. It combines two types of female hormones, an estrogen and a progestin. They are used to prevent ovulation and pregnancy. This medicine may be used for other purposes; ask your health care provider or  pharmacist if you have questions. COMMON BRAND NAME(S): [place], [place], Amethia, Amethia Lo, [place], [place], Aubra-28, Aviane, Camrese, Camrese Lo, [place], [place], [place], [place], [place], [place], [place], [place], [place], [place], [place], [place], [place], [place], Levonorgestrel/Ethinyl Estradiol, [place], [place], [place], [place], [place], [place], [place], [place], [place], [place], [place], [place], [place], [place], [place], [place], [place], Reinaldo Berber What should I tell my health care provider before I take this medicine? They need to know if you have or ever had any of these conditions: -abnormal vaginal bleeding -blood vessel disease or blood clots -breast, cervical, endometrial, ovarian, liver, or uterine cancer -diabetes -gallbladder disease -heart disease or recent heart attack -high blood pressure -high cholesterol -kidney disease -liver disease -migraine headaches -stroke -systemic lupus erythematosus (SLE) -tobacco smoker -an unusual or allergic reaction to estrogens, progestins, other medicines, foods, dyes, or preservatives -pregnant or trying to get pregnant -breast-feeding How should I use this medicine? Take this medicine by mouth. To reduce nausea, this medicine may be taken with food. Follow the directions on the prescription label. Take this medicine at the same time each day and in the order directed on the package. Do not take your medicine more often than directed. Contact your pediatrician regarding the use of this medicine in children. Special care may be needed. This medicine has been used in female children who have started having menstrual periods. A patient package insert for the product will be given with each prescription and refill. Read this sheet carefully each time. The sheet may change frequently. Overdosage: If you think you have taken too much of this medicine contact a poison control center or emergency room at  once. NOTE: This medicine is only for you. Do not share this medicine with others. What if I miss a dose? If you miss a dose, refer to the patient information sheet you received with your medicine for direction. If you miss more than one pill, this medicine may not be as effective and you may need to use another form of birth control. What may interact with this medicine? Do not take this medicine with the following medication: -dasabuvir; ombitasvir; paritaprevir; ritonavir -ombitasvir; paritaprevir; ritonavir This medicine may also interact with the following medications: -acetaminophen -antibiotics or medicines for infections, especially rifampin, rifabutin, rifapentine, and griseofulvin, and possibly penicillins or tetracyclines -aprepitant -ascorbic acid (vitamin C) -atorvastatin -barbiturate medicines, such as phenobarbital -bosentan -carbamazepine -caffeine -clofibrate -cyclosporine -dantrolene -doxercalciferol -felbamate -grapefruit juice -hydrocortisone -medicines for anxiety or sleeping problems, such as diazepam or temazepam -medicines for diabetes, including pioglitazone -mineral oil -modafinil -mycophenolate -nefazodone -oxcarbazepine -phenytoin -prednisolone -ritonavir or other medicines for HIV infection or AIDS -rosuvastatin -selegiline -soy isoflavones supplements -St. John's wort -tamoxifen or raloxifene -theophylline -thyroid hormones -topiramate -warfarin This list may not describe all possible interactions. Give your health care provider a list of all the medicines, herbs, non-prescription drugs, or dietary supplements you use. Also tell them if you smoke, drink alcohol, or use illegal  drugs. Some items may interact with your medicine. What should I watch for while using this medicine? Visit your doctor or health care professional for regular checks on your progress. You will need a regular breast and pelvic exam and Pap smear while on this  medicine. Use an additional method of contraception during the first cycle that you take these tablets. If you have any reason to think you are pregnant, stop taking this medicine right away and contact your doctor or health care professional. If you are taking this medicine for hormone related problems, it may take several cycles of use to see improvement in your condition. Smoking increases the risk of getting a blood clot or having a stroke while you are taking birth control pills, especially if you are more than 46 years old. You are strongly advised not to smoke. This medicine can make your body retain fluid, making your fingers, hands, or ankles swell. Your blood pressure can go up. Contact your doctor or health care professional if you feel you are retaining fluid. This medicine can make you more sensitive to the sun. Keep out of the sun. If you cannot avoid being in the sun, wear protective clothing and use sunscreen. Do not use sun lamps or tanning beds/booths. If you wear contact lenses and notice visual changes, or if the lenses begin to feel uncomfortable, consult your eye care specialist. In some women, tenderness, swelling, or minor bleeding of the gums may occur. Notify your dentist if this happens. Brushing and flossing your teeth regularly may help limit this. See your dentist regularly and inform your dentist of the medicines you are taking. If you are going to have elective surgery, you may need to stop taking this medicine before the surgery. Consult your health care professional for advice. This medicine does not protect you against HIV infection (AIDS) or any other sexually transmitted diseases. What side effects may I notice from receiving this medicine? Side effects that you should report to your doctor or health care professional as soon as possible: -breast tissue changes or discharge -changes in vaginal bleeding during your period or between your periods -chest  pain -coughing up blood -dizziness or fainting spells -headaches or migraines -leg, arm or groin pain -severe or sudden headaches -stomach pain (severe) -sudden shortness of breath -sudden loss of coordination, especially on one side of the body -speech problems -symptoms of vaginal infection like itching, irritation or unusual discharge -tenderness in the upper abdomen -vomiting -weakness or numbness in the arms or legs, especially on one side of the body -yellowing of the eyes or skin Side effects that usually do not require medical attention (report to your doctor or health care professional if they continue or are bothersome): -breakthrough bleeding and spotting that continues beyond the 3 initial cycles of pills -breast tenderness -mood changes, anxiety, depression, frustration, anger, or emotional outbursts -increased sensitivity to sun or ultraviolet light -nausea -skin rash, acne, or brown spots on the skin -weight gain (slight) This list may not describe all possible side effects. Call your doctor for medical advice about side effects. You may report side effects to FDA at 1-800-FDA-1088. Where should I keep my medicine? Keep out of the reach of children. Store at room temperature between 15 and 30 degrees C (59 and 86 degrees F). Throw away any unused medicine after the expiration date. NOTE: This sheet is a summary. It may not cover all possible information. If you have questions about this medicine, talk to your  doctor, pharmacist, or health care provider.  2018 Elsevier/Gold Standard (2015-10-21 07:58:22)

## 2017-10-15 NOTE — Progress Notes (Signed)
47 y.o. G44P2012 Married [place] female here for annual exam.    Menses regular.  Anticipating her menstrual during upcoming travel.  Periods is painful. Wants amenorrhea.   Freckle on her vulva.   Still has left pain and lumps that come and go.  Ran into a wall and her her left lateral breast.   Took COC in past.  Denies hx of HTN, liver disease, breast disease, DVT/PE or family history of this, migraine HA.  Feeling so much better on Prozac. PCP prescribing.   Had decreased libido and so started Wellbutrin for this.  Does not think it really helps.  Can enjoy when she is sexually active.   Traveling to [place] for the end of the year.   PCP: Dr. Melford Aase     Patient's last menstrual period was 09/29/2017.           Sexually active: Yes.    The current method of family planning is tubal ligation.    Exercising: No.  The patient does not participate in regular exercise at present. Smoker:  no  Health Maintenance: Pap:  04-20-14 Neg:Neg HR HPV History of abnormal Pap:  no MMG:  12/02/16 Bilateral MMG/Left and Right [place] - BIRADS 2 benign/density c Colonoscopy:   2011 inflammation in [place];had sigmoidoscopy 6 months later and negative. BMD:   n/a  Result  n/a TDaP:  Per patient UTD Gardasil:   no HIV: negative in the past Hep C: unsure Screening Labs:  PCP   reports that she has never smoked. She has never used smokeless tobacco. She reports that she does not drink alcohol or use drugs.  Past Medical History:  Diagnosis Date  . Anemia   . Anxiety   . Fibroid   . Heart murmur    pt. thinks she has a benign murmur dx'd during pregnancy  . Mitral regurgitation    a. Prior 2D echo in 2014 showed technically limited study, EF 60%, mild MR, trace TR.    Past Surgical History:  Procedure Laterality Date  . CESAREAN SECTION  2009  . TUBAL LIGATION  2009    Current Outpatient Medications  Medication Sig Dispense Refill  . buPROPion (WELLBUTRIN XL) 150 MG 24 hr  tablet Take 1 tablet by mouth daily.  0  . CRANBERRY EXTRACT PO Take 2 capsules by mouth daily.    . ferrous sulfate 325 (65 FE) MG tablet Take by mouth daily.    Marland Kitchen FLUoxetine (PROZAC) 20 MG capsule Take 1 capsule by mouth daily.  0  . polyethylene glycol (MIRALAX / GLYCOLAX) packet Take 17 g by mouth daily as needed. Takes 1/2 capful daily     . Probiotic Product (PROBIOTIC DAILY PO) Take by mouth.     No current facility-administered medications for this visit.     Family History  Problem Relation Age of Onset  . Hypertension Mother   . Arrhythmia Mother   . Depression Mother   . Heart disease Mother   . Heart failure Mother   . Diabetes Mother   . Hypertension Father   . Cancer Paternal Grandmother        tumor in spine  . Dementia Paternal Grandfather     Review of Systems  Constitutional:       Breast pain Breast lumps  All other systems reviewed and are negative.   Exam:   BP (!) 100/58 (BP Location: Right Arm, Patient Position: Sitting, Cuff Size: Normal)   Pulse 88   Resp 16  Ht 5' 5.5" (1.664 m)   Wt 141 lb (64 kg)   LMP 09/29/2017   BMI 23.11 kg/m     General appearance: alert, cooperative and appears stated age Head: Normocephalic, without obvious abnormality, atraumatic Neck: no adenopathy, supple, symmetrical, trachea midline and thyroid normal to inspection and palpation Lungs: clear to auscultation bilaterally Breasts: right - normal appearance, no masses or tenderness, No nipple retraction or dimpling, No nipple discharge or bleeding, No axillary or supraclavicular adenopathy Left with 1 cm flat mobile mass at 9:00, No nipple retraction or dimpling, No nipple discharge or bleeding, No axillary or supraclavicular adenopathy. Heart: regular rate and rhythm Abdomen: soft, non-tender; no masses, no organomegaly Extremities: extremities normal, atraumatic, no cyanosis or edema Skin: Skin color, texture, turgor normal. No rashes or lesions Lymph nodes:  Cervical, supraclavicular, and axillary nodes normal. No abnormal inguinal nodes palpated Neurologic: Grossly normal  Pelvic: External genitalia:  no lesions              Urethra:  normal appearing urethra with no masses, tenderness or lesions              Bartholins and Skenes: normal                 Vagina: normal appearing vagina with normal color and discharge, no lesions              Cervix: no lesions              Pap taken: No. Bimanual Exam:  Uterus:  normal size, contour, position, consistency, mobility, non-tender              Adnexa: no mass, fullness, tenderness              Rectal exam: Yes.  .  Confirms.              Anus:  normal sphincter tone, no lesions  Chaperone was present for exam.  Assessment:   Well woman visit with normal exam. Status post BTL.  Small fibroid. Anxiety improved with Prozac and Wellbutrin.  Decreased libido.   Left breast mass at 9:00   Plan: Mammogram left dx and left breast US Recommended self breast awareness. Pap and HR HPV as above. Guidelines for Calcium, Vitamin D, regular exercise program including cardiovascular and weight bearing exercise. Seasonale. 1 pack and 1 refill.  Warning signs and risks of stroke, PE, MI, and DVT discussed.  She will start this in October provided her mammogram is reassuring.  Follow up annually and prn.   After visit summary provided.

## 2017-10-15 NOTE — Progress Notes (Signed)
Patient scheduled for left breast Dx MMG and [place] on 10/26/17 at 3:20pm, arriving at 3pm at [facility]. Patient request to schedule on a day that Dr. Shelly Bombard is working. Declined earlier appt offered for 10-23-2017. Patient request CPT codes for imaging, provided patient with CPT codes [phone] and [phone]. Patient also request copy of MMG from 12/02/16 and 01/13/2016, copies provided. Patient verbalizes understanding and is agreeable.

## 2017-10-15 NOTE — Addendum Note (Signed)
Addended by: Yisroel Ramming, Dietrich Pates E on: 10/15/2017 01:18 PM   Modules accepted: Orders

## 2017-10-19 LAB — CYTOLOGY - PAP
Diagnosis: NEGATIVE
HPV (WINDOPATH): NOT DETECTED

## 2017-10-26 ENCOUNTER — Ambulatory Visit
Admission: RE | Admit: 2017-10-26 | Discharge: 2017-10-26 | Disposition: A | Discharge status: Home / Self Care | Payer: Managed Care, Other (non HMO) | Source: Ambulatory Visit | Attending: Obstetrics and Gynecology | Admitting: Obstetrics and Gynecology

## 2017-10-26 ENCOUNTER — Other Ambulatory Visit: Payer: Self-pay | Admitting: Obstetrics and Gynecology

## 2017-10-26 ENCOUNTER — Other Ambulatory Visit: Payer: Self-pay

## 2017-10-26 DIAGNOSIS — N6489 Other specified disorders of breast: Secondary | ICD-10-CM

## 2017-10-26 DIAGNOSIS — N6320 Unspecified lump in the left breast, unspecified quadrant: Secondary | ICD-10-CM

## 2017-12-30 ENCOUNTER — Telehealth: Payer: Self-pay | Admitting: *Deleted

## 2017-12-30 NOTE — Telephone Encounter (Signed)
Patient is traveling to [place] with her family February 08, 2018. She will contact Occupational Health at [phone]. Landis Gandy, RN

## 2017-12-30 NOTE — Telephone Encounter (Signed)
-----   Message from [place] sent at 12/28/2017  3:54 PM EST ----- Contact: [phone] Patient called with questions on regards to the travel clinic. Please call after 2:30PM

## 2018-10-17 ENCOUNTER — Ambulatory Visit: Payer: Managed Care, Other (non HMO) | Admitting: Obstetrics and Gynecology

## 2019-01-23 ENCOUNTER — Other Ambulatory Visit: Payer: Self-pay

## 2019-01-24 ENCOUNTER — Ambulatory Visit (INDEPENDENT_AMBULATORY_CARE_PROVIDER_SITE_OTHER): Payer: Managed Care, Other (non HMO) | Admitting: Obstetrics and Gynecology

## 2019-01-24 ENCOUNTER — Encounter: Payer: Self-pay | Admitting: Obstetrics and Gynecology

## 2019-01-24 VITALS — BP 100/64 | HR 68 | Resp 14 | Ht 65.0 in | Wt 141.8 lb

## 2019-01-24 DIAGNOSIS — Z01419 Encounter for gynecological examination (general) (routine) without abnormal findings: Secondary | ICD-10-CM

## 2019-01-24 DIAGNOSIS — D649 Anemia, unspecified: Secondary | ICD-10-CM | POA: Diagnosis not present

## 2019-01-24 DIAGNOSIS — N920 Excessive and frequent menstruation with regular cycle: Secondary | ICD-10-CM | POA: Diagnosis not present

## 2019-01-24 NOTE — Progress Notes (Signed)
48 y.o. G69P2012 Married [place] female here for annual exam.    Patient states having cycles every 21 days.  Were every 28 days.  Menses last 8 days with spotting at the beginning at the end. She has clots in the toilet. She feels contraction like pain with her menstruation.   She is worried about using hormonal treatment to treat her bleeding.  She is worried about blood clots.  Would consider hysterectomy.   She is being treated for severe anemia by Oncology. Doing ferritin infusions.  She will do a sleep study for possible apnea.  PCP: Anastasia Pall, MD    Patient's last menstrual period was 01/04/2019 (exact date).           Sexually active: Yes.    The current method of family planning is tubal ligation.    Exercising: No.  The patient does not participate in regular exercise at present. Smoker:  no  Health Maintenance: Pap:10-15-17 Neg:Neg HR HPV, 04-20-14 Neg:Neg HR HPV History of abnormal Pap:  no MMG: 10-26-17 Diag.Bil.w/Bil.Br.US/Neg/density C/Benign cluster of cysts at site of palpable concern in the slightly inner left breast/screening 27yr/BiRads2--patient knows needs to schedule Colonoscopy: 12-30-18 normal but couldn't totally view end of colon d/t poor prep--next 1 year BMD:   n/a  Result  n/a TDaP:  PCP Gardasil:   no HIV:Neg in the past Hep C:Unsure Screening Labs:  PCP.  Flu vaccine:  Completed.    reports that she has never smoked. She has never used smokeless tobacco. She reports that she does not drink alcohol or use drugs.  Past Medical History:  Diagnosis Date  . Anemia   . Anxiety   . Fibroid   . Heart murmur    pt. thinks she has a benign murmur dx'd during pregnancy  . Mitral regurgitation    a. Prior 2D echo in 2014 showed technically limited study, EF 60%, mild MR, trace TR.    Past Surgical History:  Procedure Laterality Date  . CESAREAN SECTION  2009  . TUBAL LIGATION  2009    Current Outpatient Medications  Medication Sig  Dispense Refill  . ALPRAZolam (XANAX) 0.5 MG tablet TAKE 1/2-1 TABLET ONCE UP TO THREE TIMES DAILY AS NEEDED.    . Ascorbic Acid (VITAMIN C) 1000 MG tablet Take 1,000 mg by mouth daily.    . cetirizine (ZYRTEC) 10 MG tablet Take 1 tablet by mouth as needed.    . ferrous sulfate 325 (65 FE) MG tablet Take by mouth daily.    Marland Kitchen FLUoxetine (PROZAC) 20 MG capsule Take 2 capsules by mouth daily.   0  . polyethylene glycol (MIRALAX / GLYCOLAX) packet Take 17 g by mouth daily as needed. Takes 1/2 capful daily      No current facility-administered medications for this visit.     Family History  Problem Relation Age of Onset  . Hypertension Mother   . Arrhythmia Mother   . Depression Mother   . Heart disease Mother   . Heart failure Mother   . Diabetes Mother   . Hypertension Father   . Cancer Paternal Grandmother        tumor in spine  . Dementia Paternal Grandfather     Review of Systems  Exam:   BP 100/64   Pulse 68   Resp 14   Ht 5\' 5"  (1.651 m)   Wt 141 lb 12.8 oz (64.3 kg)   LMP 01/04/2019 (Exact Date)   BMI 23.60 kg/m  General appearance: alert, cooperative and appears stated age Head: normocephalic, without obvious abnormality, atraumatic Neck: no adenopathy, supple, symmetrical, trachea midline and thyroid normal to inspection and palpation Lungs: clear to auscultation bilaterally Breasts: normal appearance, no masses or tenderness, No nipple retraction or dimpling, No nipple discharge or bleeding, No axillary adenopathy Heart: regular rate and rhythm Abdomen: soft, non-tender; no masses, no organomegaly Extremities: extremities normal, atraumatic, no cyanosis or edema Skin: skin color, texture, turgor normal. No rashes or lesions Lymph nodes: cervical, supraclavicular, and axillary nodes normal. Neurologic: grossly normal  Pelvic: External genitalia:  no lesions              No abnormal inguinal nodes palpated.              Urethra:  normal appearing urethra with  no masses, tenderness or lesions              Bartholins and Skenes: normal                 Vagina: normal appearing vagina with normal color and discharge, no lesions              Cervix: no lesions              Pap taken: No. Bimanual Exam:  Uterus:  normal size, contour, position, consistency, mobility, non-tender              Adnexa: no mass, fullness, tenderness              Rectal exam: Yes.  .  Confirms.              Anus:  normal sphincter tone, no lesions  Chaperone was present for exam.  Assessment:   Well woman visit with normal exam. Hx anemia.  Doing iron infusions.  Anxiety and panic.   Plan: Mammogram screening discussed. Self breast awareness reviewed. Pap and HR HPV as above. Guidelines for Calcium, Vitamin D, regular exercise program including cardiovascular and weight bearing exercise. Return for pelvic ultrasound.  We discussed Micronor, Mirena IUD, and possible hysterectomy.   HO on Mirena and hysterectomy to patient.  Follow up annually and prn.   After visit summary provided.

## 2019-01-24 NOTE — Patient Instructions (Signed)

## 2019-01-25 ENCOUNTER — Ambulatory Visit: Payer: Managed Care, Other (non HMO) | Admitting: Obstetrics and Gynecology

## 2019-01-30 ENCOUNTER — Telehealth: Payer: Self-pay | Admitting: Obstetrics and Gynecology

## 2019-01-30 NOTE — Telephone Encounter (Signed)
PUS menorrhagia and anemia.   Spoke with patient, reports spotting, asking if ok to proceed with PUS as scheduled? Advised ok to proceed with PUS as scheduled as long as you are comfortable with imaging while on menses. Questions answered, patient request to proceed as scheduled.   Routing to provider for final review. Patient is agreeable to disposition. Will close encounter.  Cc: Lerry Liner

## 2019-01-30 NOTE — Telephone Encounter (Signed)
Spoke with patient in regards to benefit for recommended ultrasound. Patient acknowledges understanding of information presented. Patient is scheduled 02/02/2019 with Dr [name]. Patient is aware of the appointment date arrival time and cancellation policy.  Patient states she me be "lightly" bleeding from her cycle and wants to confirm ok to proceed with ultrasound in scheduled date.  Forwarding to Triage Nurse for reivew

## 2019-02-01 ENCOUNTER — Other Ambulatory Visit: Payer: Self-pay

## 2019-02-02 ENCOUNTER — Ambulatory Visit (INDEPENDENT_AMBULATORY_CARE_PROVIDER_SITE_OTHER): Payer: Managed Care, Other (non HMO)

## 2019-02-02 ENCOUNTER — Ambulatory Visit (INDEPENDENT_AMBULATORY_CARE_PROVIDER_SITE_OTHER): Payer: Managed Care, Other (non HMO) | Admitting: Obstetrics and Gynecology

## 2019-02-02 ENCOUNTER — Encounter: Payer: Self-pay | Admitting: Obstetrics and Gynecology

## 2019-02-02 VITALS — BP 100/70 | HR 80 | Temp 97.3°F | Ht 65.5 in | Wt 141.0 lb

## 2019-02-02 DIAGNOSIS — D649 Anemia, unspecified: Secondary | ICD-10-CM | POA: Diagnosis not present

## 2019-02-02 DIAGNOSIS — N920 Excessive and frequent menstruation with regular cycle: Secondary | ICD-10-CM

## 2019-02-02 NOTE — Progress Notes (Signed)
GYNECOLOGY  VISIT   HPI: 48 y.o.   Married  [place]  female   [phone] with Patient's last menstrual period was 01/26/2019 (exact date).   here for pelvic ultrasound for heavy menstrual bleeding and clotting.  She is treated for severe anemia with ferritin infusions by oncology.  Her Hgb was 9.0 on 12/01/18.  She has a CBC recheck today with PCP.   She had menses 01/04/19 and now again on 01/26/19.  Her menses are 21 days apart.  No bleeding in between menses. Still spotting today.   She declines treatment with estrogen in it.   GYNECOLOGIC HISTORY: Patient's last menstrual period was 01/26/2019 (exact date). Contraception: Tubal Menopausal hormone therapy:  n/a Last mammogram: 10-26-17 Diag.Bil.w/Bil.Br.US/Neg/density C/Benign cluster of cysts at site of palpable concern in the slightly inner left breast/screening 12yr/BiRads2--patient knows needs to schedule Last pap smear: 10-15-17 Neg:Neg HR HPV, 04-20-14 Neg:Neg HR HPV        OB History    Gravida  3   Para  2   Term  2   Preterm      AB  1   Living  2     SAB  1   TAB      Ectopic      Multiple      Live Births  2              Patient Active Problem List   Diagnosis Date Noted  . Discomfort in chest 10/29/2016  . Chronic fatigue 10/29/2016  . Allergic rhinitis due to allergen 10/29/2016  . Vitamin D deficiency 10/29/2016    Past Medical History:  Diagnosis Date  . Anemia   . Anxiety   . Fibroid   . Heart murmur    pt. thinks she has a benign murmur dx'd during pregnancy  . Mitral regurgitation    a. Prior 2D echo in 2014 showed technically limited study, EF 60%, mild MR, trace TR.    Past Surgical History:  Procedure Laterality Date  . CESAREAN SECTION  2009  . TUBAL LIGATION  2009    Current Outpatient Medications  Medication Sig Dispense Refill  . ALPRAZolam (XANAX) 0.5 MG tablet TAKE 1/2-1 TABLET ONCE UP TO THREE TIMES DAILY AS NEEDED.    . Ascorbic Acid (VITAMIN C) 1000 MG tablet  Take 1,000 mg by mouth daily.    . cetirizine (ZYRTEC) 10 MG tablet Take 1 tablet by mouth as needed.    . ferrous sulfate 325 (65 FE) MG tablet Take by mouth daily.    Marland Kitchen FLUoxetine (PROZAC) 20 MG capsule Take 2 capsules by mouth daily.   0  . polyethylene glycol (MIRALAX / GLYCOLAX) packet Take 17 g by mouth daily as needed. Takes 1/2 capful daily      No current facility-administered medications for this visit.     ALLERGIES: Sulfamethoxazole-trimethoprim  Family History  Problem Relation Age of Onset  . Hypertension Mother   . Arrhythmia Mother   . Depression Mother   . Heart disease Mother   . Heart failure Mother   . Diabetes Mother   . Hypertension Father   . Cancer Paternal Grandmother        tumor in spine  . Dementia Paternal Grandfather     Social History   Socioeconomic History  . Marital status: Married    Spouse name: Not on file  . Number of children: Not on file  . Years of education: Not on file  .  Highest education level: Not on file  Occupational History  . Occupation: housewife  Tobacco Use  . Smoking status: Never Smoker  . Smokeless tobacco: Never Used  Substance and Sexual Activity  . Alcohol use: No    Alcohol/week: 0.0 standard drinks  . Drug use: No  . Sexual activity: Yes    Partners: Male    Birth control/protection: Surgical    Comment: Tubal  Other Topics Concern  . Not on file  Social History Narrative   Lives with husband and children.    Housewife.    Social Determinants of Health   Financial Resource Strain:   . Difficulty of Paying Living Expenses: Not on file  Food Insecurity:   . Worried About Charity fundraiser in the Last Year: Not on file  . Ran Out of Food in the Last Year: Not on file  Transportation Needs:   . Lack of Transportation (Medical): Not on file  . Lack of Transportation (Non-Medical): Not on file  Physical Activity:   . Days of Exercise per Week: Not on file  . Minutes of Exercise per Session: Not on  file  Stress:   . Feeling of Stress : Not on file  Social Connections:   . Frequency of Communication with Friends and Family: Not on file  . Frequency of Social Gatherings with Friends and Family: Not on file  . Attends Religious Services: Not on file  . Active Member of Clubs or Organizations: Not on file  . Attends Archivist Meetings: Not on file  . Marital Status: Not on file  Intimate Partner Violence:   . Fear of Current or Ex-Partner: Not on file  . Emotionally Abused: Not on file  . Physically Abused: Not on file  . Sexually Abused: Not on file    Review of Systems  All other systems reviewed and are negative.   PHYSICAL EXAMINATION:    BP 100/70   Pulse 80   Temp (!) 97.3 F (36.3 C) (Temporal)   Ht 5' 5.5" (1.664 m)   Wt 141 lb (64 kg)   LMP 01/26/2019 (Exact Date)   BMI 23.11 kg/m     General appearance: alert, cooperative and appears stated age  Pelvic US Uterus no masses.  EMS 3.57 mm.  Ovaries normal.  No free fluid.  ASSESSMENT  Menorrhagia with anemia. Status post BTL.  Status post Cesarean Section.   PLAN  We reviewed her normal [place] and options for care - Mirena, Micronor, hysterectomy.  She chooses Mirena IUD.  Risks and benefits reviewed. Will need to precert.  Will do EMB at the time of the Mirena IUD insertion.  Rationale discussed.  She already has a brochure for Mirena.   An After Visit Summary was printed and given to the patient.  _25____ minutes face to face time of which over 50% was spent in counseling.

## 2019-02-02 NOTE — Patient Instructions (Signed)
Endometrial Biopsy  Endometrial biopsy is a procedure in which a tissue sample is taken from inside the uterus. The sample is taken from the endometrium, which is the lining of the uterus. The tissue sample is then checked under a microscope to see if the tissue is normal or abnormal. This procedure helps to determine where you are in your menstrual cycle and how hormone levels are affecting the lining of the uterus. This procedure may also be used to evaluate uterine bleeding or to diagnose endometrial cancer, endometrial tuberculosis, polyps, or other inflammatory conditions. Tell a health care provider about:  Any allergies you have.  All medicines you are taking, including vitamins, herbs, eye drops, creams, and over-the-counter medicines.  Any problems you or family members have had with anesthetic medicines.  Any blood disorders you have.  Any surgeries you have had.  Any medical conditions you have.  Whether you are pregnant or may be pregnant. What are the risks? Generally, this is a safe procedure. However, problems may occur, including:  Bleeding.  Pelvic infection.  Puncture of the wall of the uterus with the biopsy device (rare). What happens before the procedure?  Keep a record of your menstrual cycles as told by your health care provider. You may need to schedule your procedure for a specific time in your cycle.  You may want to bring a sanitary pad to wear after the procedure.  Ask your health care provider about: ? Changing or stopping your regular medicines. This is especially important if you are taking diabetes medicines or blood thinners. ? Taking medicines such as aspirin and ibuprofen. These medicines can thin your blood. Do not take these medicines before your procedure if your health care provider instructs you not to.  Plan to have someone take you home from the hospital or clinic. What happens during the procedure?  To lower your risk of infection: ?  Your health care team will wash or sanitize their hands.  You will lie on an exam table with your feet and legs supported as in a pelvic exam.  Your health care provider will insert an instrument (speculum) into your vagina to see your cervix.  Your cervix will be cleansed with an antiseptic solution.  A medicine (local anesthetic) will be used to numb the cervix.  A forceps instrument (tenaculum) will be used to hold your cervix steady for the biopsy.  A thin, rod-like instrument (uterine sound) will be inserted through your cervix to determine the length of your uterus and the location where the biopsy sample will be removed.  A thin, flexible tube (catheter) will be inserted through your cervix and into the uterus. The catheter will be used to collect the biopsy sample from your endometrial tissue.  The catheter and speculum will then be removed, and the tissue sample will be sent to a lab for examination. What happens after the procedure?  You will rest in a recovery area until you are ready to go home.  You may have mild cramping and a small amount of vaginal bleeding. This is normal.  It is up to you to get the results of your procedure. Ask your health care provider, or the department that is doing the procedure, when your results will be ready. Summary  Endometrial biopsy is a procedure in which a tissue sample is taken from the endometrium, which is the lining of the uterus.  This procedure may help to diagnose menstrual cycle problems, abnormal bleeding, or other conditions affecting  the endometrium.  Before the procedure, keep a record of your menstrual cycles as told by your health care provider.  The tissue sample that is removed will be checked under a microscope to see if it is normal or abnormal. This information is not intended to replace advice given to you by your health care provider. Make sure you discuss any questions you have with your health care provider.  Document Released: 06/12/2004 Document Revised: 01/22/2017 Document Reviewed: 02/26/2016 Elsevier Patient Education  2020 [employer].

## 2019-02-03 ENCOUNTER — Telehealth: Payer: Self-pay | Admitting: Obstetrics and Gynecology

## 2019-02-03 NOTE — Telephone Encounter (Signed)
Call placed to convey benefits for Mirena insertion. °

## 2019-03-14 ENCOUNTER — Telehealth: Payer: Self-pay | Admitting: Obstetrics and Gynecology

## 2019-03-14 NOTE — Telephone Encounter (Signed)
Patient is returning a call to [facility] from 02/03/19 for her Mirena insertion benefits.

## 2019-03-15 NOTE — Telephone Encounter (Signed)
Call placed to convey benefits for Mirena insertion and endometrial biopsy. Spoke with patient and conveyed the benefits. Patient understands/agreeable with the benefits. Patient to discuss benefits with her spouse and call back for scheduling.

## 2019-03-20 NOTE — Telephone Encounter (Signed)
Spoke to pt. Pt states ready to schedule for IUD insertion and EMB bx since talking with spouse. Pt scheduled for both on 03/22/2019 at 8am with Dr [name]. OK per OV on 02/02/2019. Pt states only having light brown spotting at this time.   Pt aware of appt and instructed to take Motrin 800 mg with food and water one hour before procedure. Pt states will have spouse to bring and take home after procedure. Pt aware of benefits call from [place] today. Pt verbalized understanding.   Will route to Dr [name] for review and will close encounter.  Cc: Suzy Future orders placed by Dr [name] on 02/02/19.

## 2019-03-20 NOTE — Telephone Encounter (Signed)
Patient is calling to proceed with scheduling IUD insertion and endometrial biopsy.

## 2019-03-21 NOTE — Progress Notes (Signed)
GYNECOLOGY  VISIT   HPI: 49 y.o.   Married  [place]  female   [phone] with Patient's last menstrual period was 03/15/2019 (exact date).   here for Mirena IUD insertion and EMB.   Patient has heavy menstrual bleeding and clotting.  She has been treated for severe anemia with ferritin infusions.  She had 2 menses close together in November/December 2020.   Pelvic US on 02/02/19: Uterus no masses.  EMS 3.57 mm.  Ovaries normal.  No free fluid.  UPT: Neg  GYNECOLOGIC HISTORY: Patient's last menstrual period was 03/15/2019 (exact date). Contraception:  Tubal Menopausal hormone therapy:  n/a Last mammogram:10-26-17 Diag.Bil.w/Bil.Br.US/Neg/density C/Benign cluster of cysts at site of palpable concern in the slightly inner left breast/screening 75yr/BiRads2 Last pap smear:  10-15-17 Neg:Neg HR HPV,04-20-14 Neg:Neg HR HPV        OB History    Gravida  3   Para  2   Term  2   Preterm      AB  1   Living  2     SAB  1   TAB      Ectopic      Multiple      Live Births  2              Patient Active Problem List   Diagnosis Date Noted  . Discomfort in chest 10/29/2016  . Chronic fatigue 10/29/2016  . Allergic rhinitis due to allergen 10/29/2016  . Vitamin D deficiency 10/29/2016    Past Medical History:  Diagnosis Date  . Anemia   . Anxiety   . Fibroid   . Heart murmur    pt. thinks she has a benign murmur dx'd during pregnancy  . Mitral regurgitation    a. Prior 2D echo in 2014 showed technically limited study, EF 60%, mild MR, trace TR.    Past Surgical History:  Procedure Laterality Date  . CESAREAN SECTION  2009  . TUBAL LIGATION  2009    Current Outpatient Medications  Medication Sig Dispense Refill  . ALPRAZolam (XANAX) 0.5 MG tablet TAKE 1/2-1 TABLET ONCE UP TO THREE TIMES DAILY AS NEEDED.    . Ascorbic Acid (VITAMIN C) 1000 MG tablet Take 1,000 mg by mouth daily.    . cetirizine (ZYRTEC) 10 MG tablet Take 1 tablet by mouth as needed.     . Cholecalciferol (VITAMIN D3 PO) Take 1 capsule by mouth daily. Takes 1000 units daily    . ferrous sulfate 325 (65 FE) MG tablet Take by mouth daily.    Marland Kitchen FLUoxetine (PROZAC) 20 MG capsule Take 2 capsules by mouth daily.   0  . polyethylene glycol (MIRALAX / GLYCOLAX) packet Take 17 g by mouth daily as needed. Takes 1/2 capful daily      No current facility-administered medications for this visit.     ALLERGIES: Sulfamethoxazole-trimethoprim  Family History  Problem Relation Age of Onset  . Hypertension Mother   . Arrhythmia Mother   . Depression Mother   . Heart disease Mother   . Heart failure Mother   . Diabetes Mother   . Hypertension Father   . Cancer Paternal Grandmother        tumor in spine  . Dementia Paternal Grandfather     Social History   Socioeconomic History  . Marital status: Married    Spouse name: Not on file  . Number of children: Not on file  . Years of education: Not on file  . Highest  education level: Not on file  Occupational History  . Occupation: housewife  Tobacco Use  . Smoking status: Never Smoker  . Smokeless tobacco: Never Used  Substance and Sexual Activity  . Alcohol use: No    Alcohol/week: 0.0 standard drinks  . Drug use: No  . Sexual activity: Yes    Partners: Male    Birth control/protection: Surgical    Comment: Tubal  Other Topics Concern  . Not on file  Social History Narrative   Lives with husband and children.    Housewife.    Social Determinants of Health   Financial Resource Strain:   . Difficulty of Paying Living Expenses: Not on file  Food Insecurity:   . Worried About Charity fundraiser in the Last Year: Not on file  . Ran Out of Food in the Last Year: Not on file  Transportation Needs:   . Lack of Transportation (Medical): Not on file  . Lack of Transportation (Non-Medical): Not on file  Physical Activity:   . Days of Exercise per Week: Not on file  . Minutes of Exercise per Session: Not on file   Stress:   . Feeling of Stress : Not on file  Social Connections:   . Frequency of Communication with Friends and Family: Not on file  . Frequency of Social Gatherings with Friends and Family: Not on file  . Attends Religious Services: Not on file  . Active Member of Clubs or Organizations: Not on file  . Attends Archivist Meetings: Not on file  . Marital Status: Not on file  Intimate Partner Violence:   . Fear of Current or Ex-Partner: Not on file  . Emotionally Abused: Not on file  . Physically Abused: Not on file  . Sexually Abused: Not on file    Review of Systems  All other systems reviewed and are negative.   PHYSICAL EXAMINATION:    BP 100/62   Pulse 84   Temp (!) 96.5 F (35.8 C) (Temporal)   Ht 5' 5.5" (1.664 m)   Wt 145 lb 12.8 oz (66.1 kg)   LMP 03/15/2019 (Exact Date)   BMI 23.89 kg/m     General appearance: alert, cooperative and appears stated age  Pelvic: External genitalia:  no lesions              Urethra:  normal appearing urethra with no masses, tenderness or lesions              Bartholins and Skenes: normal                 Vagina: normal appearing vagina with normal color and discharge, no lesions              Cervix: no lesions                Bimanual Exam:  Uterus:  normal size, contour, position, consistency, mobility, non-tender              Adnexa: no mass, fullness, tenderness  EMB/Mirena IUD insertion - Mirena IUD [id number], expiration Feb 2023.   Consent for procedures Sterile prep with Hibiclens.  Paracervical block with 9.5 cc 1% lidocaine - lot [id number], Exp 4/22. Tenaculum to anterior cervical lip. Os finder used.  Pipelle passed x 2 to just under 8 cm. Tissue to pathology.  Uterine sounded to just under 8 cm.  Mirena IUD placed without difficulty. BM exam - no change.  Minimal EBL.  No complications.  Chaperone was present for exam.  ASSESSMENT  Status post BTL.  Status post Cesarean Section.  Menorrhagia,  recent irregular menses. Mirena IUD insertion.   PLAN  Post Mirena IUD insertion and post EMB instructions reviewed orally and in written form. FU EMB.  Ibuprofen 800 mg po q 8 hours prn.  Recheck in 4 weeks.    An After Visit Summary was printed and given to the patient.

## 2019-03-22 ENCOUNTER — Other Ambulatory Visit (HOSPITAL_COMMUNITY)
Admission: RE | Admit: 2019-03-22 | Discharge: 2019-03-22 | Disposition: A | Discharge status: Home / Self Care | Payer: Managed Care, Other (non HMO) | Source: Ambulatory Visit | Attending: Obstetrics and Gynecology | Admitting: Obstetrics and Gynecology

## 2019-03-22 ENCOUNTER — Ambulatory Visit (INDEPENDENT_AMBULATORY_CARE_PROVIDER_SITE_OTHER): Payer: Managed Care, Other (non HMO) | Admitting: Obstetrics and Gynecology

## 2019-03-22 ENCOUNTER — Other Ambulatory Visit: Payer: Self-pay

## 2019-03-22 ENCOUNTER — Encounter: Payer: Self-pay | Admitting: Obstetrics and Gynecology

## 2019-03-22 VITALS — BP 100/62 | HR 84 | Temp 96.5°F | Ht 65.5 in | Wt 145.8 lb

## 2019-03-22 DIAGNOSIS — Z30430 Encounter for insertion of intrauterine contraceptive device: Secondary | ICD-10-CM | POA: Diagnosis not present

## 2019-03-22 DIAGNOSIS — N920 Excessive and frequent menstruation with regular cycle: Secondary | ICD-10-CM | POA: Diagnosis not present

## 2019-03-22 DIAGNOSIS — Z01812 Encounter for preprocedural laboratory examination: Secondary | ICD-10-CM | POA: Diagnosis not present

## 2019-03-22 DIAGNOSIS — N840 Polyp of corpus uteri: Secondary | ICD-10-CM

## 2019-03-22 HISTORY — DX: Polyp of corpus uteri: N84.0

## 2019-03-22 LAB — POCT URINE PREGNANCY: Preg Test, Ur: NEGATIVE

## 2019-03-22 NOTE — Patient Instructions (Signed)
Endometrial Biopsy, Care After This sheet gives you information about how to care for yourself after your procedure. Your health care provider may also give you more specific instructions. If you have problems or questions, contact your health care provider. What can I expect after the procedure? After the procedure, it is common to have:  Mild cramping.  A small amount of vaginal bleeding for a few days. This is normal. Follow these instructions at home:   Take over-the-counter and prescription medicines only as told by your health care provider.  Do not douche, use tampons, or have sexual intercourse until your health care provider approves.  Return to your normal activities as told by your health care provider. Ask your health care provider what activities are safe for you.  Follow instructions from your health care provider about any activity restrictions, such as restrictions on strenuous exercise or heavy lifting. Contact a health care provider if:  You have heavy bleeding, or bleed for longer than 2 days after the procedure.  You have bad smelling discharge from your vagina.  You have a fever or chills.  You have a burning sensation when urinating or you have difficulty urinating.  You have severe pain in your lower abdomen. Get help right away if:  You have severe cramps in your stomach or back.  You pass large blood clots.  Your bleeding increases.  You become weak or light-headed, or you pass out. Summary  After the procedure, it is common to have mild cramping and a small amount of vaginal bleeding for a few days.  Do not douche, use tampons, or have sexual intercourse until your health care provider approves.  Return to your normal activities as told by your health care provider. Ask your health care provider what activities are safe for you. This information is not intended to replace advice given to you by your health care provider. Make sure you discuss any  questions you have with your health care provider. Document Revised: 01/22/2017 Document Reviewed: 02/26/2016 Elsevier Patient Education  [place]. Intrauterine Device Insertion, Care After  This sheet gives you information about how to care for yourself after your procedure. Your health care provider may also give you more specific instructions. If you have problems or questions, contact your health care provider. What can I expect after the procedure? After the procedure, it is common to have:  Cramps and pain in the abdomen.  Light bleeding (spotting) or heavier bleeding that is like your menstrual period. This may last for up to a few days.  Lower back pain.  Dizziness.  Headaches.  Nausea. Follow these instructions at home:  Before resuming sexual activity, check to make sure that you can feel the IUD string(s). You should be able to feel the end of the string(s) below the opening of your cervix. If your IUD string is in place, you may resume sexual activity. ? If you had a hormonal IUD inserted more than 7 days after your most recent period started, you will need to use a backup method of birth control for 7 days after IUD insertion. Ask your health care provider whether this applies to you.  Continue to check that the IUD is still in place by feeling for the string(s) after every menstrual period, or once a month.  Take over-the-counter and prescription medicines only as told by your health care provider.  Do not drive or use heavy machinery while taking prescription pain medicine.  Keep all follow-up visits  as told by your health care provider. This is important. Contact a health care provider if:  You have bleeding that is heavier or lasts longer than a normal menstrual cycle.  You have a fever.  You have cramps or abdominal pain that get worse or do not get better with medicine.  You develop abdominal pain that is new or is not in the same area of earlier  cramping and pain.  You feel lightheaded or weak.  You have abnormal or bad-smelling discharge from your vagina.  You have pain during sexual activity.  You have any of the following problems with your IUD string(s): ? The string bothers or hurts you or your sexual partner. ? You cannot feel the string. ? The string has gotten longer.  You can feel the IUD in your vagina.  You think you may be pregnant, or you miss your menstrual period.  You think you may have an STI (sexually transmitted infection). Get help right away if:  You have flu-like symptoms.  You have a fever and chills.  You can feel that your IUD has slipped out of place. Summary  After the procedure, it is common to have cramps and pain in the abdomen. It is also common to have light bleeding (spotting) or heavier bleeding that is like your menstrual period.  Continue to check that the IUD is still in place by feeling for the string(s) after every menstrual period, or once a month.  Keep all follow-up visits as told by your health care provider. This is important.  Contact your health care provider if you have problems with your IUD string(s), such as the string getting longer or bothering you or your sexual partner. This information is not intended to replace advice given to you by your health care provider. Make sure you discuss any questions you have with your health care provider. Document Revised: 01/22/2017 Document Reviewed: 01/01/2016 Elsevier Patient Education  2020 [employer].

## 2019-03-23 LAB — SURGICAL PATHOLOGY

## 2019-03-27 ENCOUNTER — Encounter: Payer: Self-pay | Admitting: Obstetrics and Gynecology

## 2019-03-28 ENCOUNTER — Encounter: Payer: Self-pay | Admitting: Obstetrics and Gynecology

## 2019-04-18 ENCOUNTER — Other Ambulatory Visit: Payer: Self-pay

## 2019-04-19 ENCOUNTER — Encounter: Payer: Self-pay | Admitting: Obstetrics and Gynecology

## 2019-04-19 ENCOUNTER — Ambulatory Visit (INDEPENDENT_AMBULATORY_CARE_PROVIDER_SITE_OTHER): Payer: Managed Care, Other (non HMO) | Admitting: Obstetrics and Gynecology

## 2019-04-19 VITALS — BP 110/68 | HR 80 | Temp 97.0°F | Ht 65.5 in | Wt 146.0 lb

## 2019-04-19 DIAGNOSIS — N840 Polyp of corpus uteri: Secondary | ICD-10-CM

## 2019-04-19 DIAGNOSIS — Z30431 Encounter for routine checking of intrauterine contraceptive device: Secondary | ICD-10-CM

## 2019-04-19 NOTE — Progress Notes (Signed)
GYNECOLOGY  VISIT   HPI: 49 y.o.   Married  [place]  female   [phone] with Patient's last menstrual period was 04/04/2019 (exact date).   here for 4 week follow up after Mirena IUD insertion.  Satisfied with her IUD.  Her IUD was placed for treatment of menorrhagia and anemia.  She developed bilateral nipple pain after placing the IUD.   She developed strong headache prior to her last menstruation.  Less flow with her cycle, but lasted longer.  Spotting followed the menstruation and is continuing until now.  Less pain since placing the IUD.  Benign endometrial polyp noted at EMB done at the time of her IUD insertion.  Her prior pelvic US was normal including an EMS of 3.57 mm.  Stress due to mother being ill in [place].   GYNECOLOGIC HISTORY: Patient's last menstrual period was 04/04/2019 (exact date). Contraception:  Tubal/Mirena IUD 03-22-19 Menopausal hormone therapy:  n/a Last mammogram::10-26-17 Diag.Bil.w/Bil.Br.US/Neg/density C/Benign cluster of cysts at site of palpable concern in the slightly inner left breast/screening 77yr/BiRads2  Last pap smear: 10-15-17 Neg:Neg HR HPV,04-20-14 Neg:Neg HR HPV        OB History    Gravida  3   Para  2   Term  2   Preterm      AB  1   Living  2     SAB  1   TAB      Ectopic      Multiple      Live Births  2              Patient Active Problem List   Diagnosis Date Noted  . Discomfort in chest 10/29/2016  . Chronic fatigue 10/29/2016  . Allergic rhinitis due to allergen 10/29/2016  . Vitamin D deficiency 10/29/2016    Past Medical History:  Diagnosis Date  . Anemia   . Anxiety   . Endometrial polyp 03/22/2019  . Fibroid   . Heart murmur    pt. thinks she has a benign murmur dx'd during pregnancy  . Mitral regurgitation    a. Prior 2D echo in 2014 showed technically limited study, EF 60%, mild MR, trace TR.    Past Surgical History:  Procedure Laterality Date  . CESAREAN SECTION  2009  . TUBAL  LIGATION  2009    Current Outpatient Medications  Medication Sig Dispense Refill  . ALPRAZolam (XANAX) 0.5 MG tablet TAKE 1/2-1 TABLET ONCE UP TO THREE TIMES DAILY AS NEEDED.    . Ascorbic Acid (VITAMIN C) 1000 MG tablet Take 1,000 mg by mouth daily.    . cetirizine (ZYRTEC) 10 MG tablet Take 1 tablet by mouth as needed.    . Cholecalciferol (VITAMIN D3 PO) Take 1 capsule by mouth daily. Takes 1000 units daily    . ferrous sulfate 325 (65 FE) MG tablet Take by mouth daily.    Marland Kitchen FLUoxetine (PROZAC) 20 MG capsule Take 2 capsules by mouth daily.   0  . levonorgestrel (MIRENA) 20 MCG/24HR IUD 1 each by Intrauterine route once.    . polyethylene glycol (MIRALAX / GLYCOLAX) packet Take 17 g by mouth daily as needed. Takes 1/2 capful daily      No current facility-administered medications for this visit.     ALLERGIES: Sulfamethoxazole-trimethoprim  Family History  Problem Relation Age of Onset  . Hypertension Mother   . Arrhythmia Mother   . Depression Mother   . Heart disease Mother   . Heart failure Mother   .  Diabetes Mother   . Hypertension Father   . Cancer Paternal Grandmother        tumor in spine  . Dementia Paternal Grandfather     Social History   Socioeconomic History  . Marital status: Married    Spouse name: Not on file  . Number of children: Not on file  . Years of education: Not on file  . Highest education level: Not on file  Occupational History  . Occupation: housewife  Tobacco Use  . Smoking status: Never Smoker  . Smokeless tobacco: Never Used  Substance and Sexual Activity  . Alcohol use: No    Alcohol/week: 0.0 standard drinks  . Drug use: No  . Sexual activity: Yes    Partners: Male    Birth control/protection: Surgical    Comment: Tubal  Other Topics Concern  . Not on file  Social History Narrative   Lives with husband and children.    Housewife.    Social Determinants of Health   Financial Resource Strain:   . Difficulty of Paying  Living Expenses: Not on file  Food Insecurity:   . Worried About Charity fundraiser in the Last Year: Not on file  . Ran Out of Food in the Last Year: Not on file  Transportation Needs:   . Lack of Transportation (Medical): Not on file  . Lack of Transportation (Non-Medical): Not on file  Physical Activity:   . Days of Exercise per Week: Not on file  . Minutes of Exercise per Session: Not on file  Stress:   . Feeling of Stress : Not on file  Social Connections:   . Frequency of Communication with Friends and Family: Not on file  . Frequency of Social Gatherings with Friends and Family: Not on file  . Attends Religious Services: Not on file  . Active Member of Clubs or Organizations: Not on file  . Attends Archivist Meetings: Not on file  . Marital Status: Not on file  Intimate Partner Violence:   . Fear of Current or Ex-Partner: Not on file  . Emotionally Abused: Not on file  . Physically Abused: Not on file  . Sexually Abused: Not on file    Review of Systems  All other systems reviewed and are negative.   PHYSICAL EXAMINATION:    BP 110/68   Pulse 80   Temp (!) 97 F (36.1 C) (Temporal)   Ht 5' 5.5" (1.664 m)   Wt 146 lb (66.2 kg)   LMP 04/04/2019 (Exact Date)   BMI 23.93 kg/m     General appearance: alert, cooperative and appears stated age   Pelvic: External genitalia:  no lesions              Urethra:  normal appearing urethra with no masses, tenderness or lesions              Bartholins and Skenes: normal                 Vagina: normal appearing vagina with normal color and discharge, no lesions              Cervix: no lesions.  IUD string noted.                 Bimanual Exam:  Uterus:  normal size, contour, position, consistency, mobility, non-tender              Adnexa: no mass, fullness, tenderness  Chaperone was present for exam.  ASSESSMENT  Mirena IUD.  Incidental finding of benign endometrial polyp.  Hx anemia.  Status post  BTL.  PLAN  Discussion of bleeding profiles with Mirena.  No treatment of her endometrial polyp is needed.  She will schedule her mammogram. She will follow up with her PCP for monitoring of her anemia.  FU for annual exam.    An After Visit Summary was printed and given to the patient.  ___20___ minutes face to face time of which over 50% was spent in counseling.

## 2019-05-01 ENCOUNTER — Other Ambulatory Visit: Payer: Self-pay | Admitting: Obstetrics and Gynecology

## 2019-05-01 DIAGNOSIS — Z1231 Encounter for screening mammogram for malignant neoplasm of breast: Secondary | ICD-10-CM

## 2019-05-24 ENCOUNTER — Telehealth: Payer: Self-pay

## 2019-05-24 ENCOUNTER — Encounter: Payer: Self-pay | Admitting: Obstetrics and Gynecology

## 2019-05-24 NOTE — Telephone Encounter (Signed)
Patient called wanting to discuss questions regarding upcoming mammogram.

## 2019-05-24 NOTE — Telephone Encounter (Signed)
Spoke to pt. Pt states having MMG scheduled for 05/31/2019 at [facility]. Pt states older sister by 1 year was just dx with breast CA and has report if Dr [name] would like to see it, states written in portuguese.  Pt to call TBC and give update on family hx. Pt agreeable and has TBC phone number.   Routing to Dr [name] for review and will close encounter.

## 2019-05-31 ENCOUNTER — Other Ambulatory Visit: Payer: Self-pay

## 2019-05-31 ENCOUNTER — Ambulatory Visit
Admission: RE | Admit: 2019-05-31 | Discharge: 2019-05-31 | Disposition: A | Discharge status: Home / Self Care | Payer: Managed Care, Other (non HMO) | Source: Ambulatory Visit | Attending: Obstetrics and Gynecology | Admitting: Obstetrics and Gynecology

## 2019-05-31 DIAGNOSIS — Z1231 Encounter for screening mammogram for malignant neoplasm of breast: Secondary | ICD-10-CM

## 2019-06-15 ENCOUNTER — Telehealth: Payer: Self-pay | Admitting: Obstetrics and Gynecology

## 2019-06-15 NOTE — Telephone Encounter (Signed)
Spoke with patient. Patient is requesting MyChart consult with Dr. [name] to further discuss breast MRI and possibly genetic testing. Patients sister recent Dx of breast cancer at 48yo.   MyChart visit scheduled for 06/21/19 at 4:30pm with Dr. [name].   Routing to provider for final review. Patient is agreeable to disposition. Will close encounter.

## 2019-06-15 NOTE — Telephone Encounter (Signed)
Patient's older sister was recently diagnosed with breast cancer and she would like a consult visit with Dr [name] to discuss getting a biopsy.

## 2019-06-20 NOTE — Progress Notes (Signed)
GYNECOLOGY  VISIT   HPI: 49 y.o.   Married [place] female   [phone] with Patient's last menstrual period was 06/07/2019 (approximate).   here for MyChart Web-Ex to discuss family history of breast cancer.    She gives consent for the visit.  I am in my office.  She is at her home.  Started at 4:54. Ended 5:22.  Patient has family with dx of breast cancer and she knows her own personal risk of this is increased.  She wants to be as proactive as possible.  States she is having panic attacks about this issue.   Her step mother and her 29 year old sister were dx with breast cancer in [place].  Her sister had a mammogram last year followed by a breast US this year.  She then had a BI-RADS4 MRI and breast biopsy which confirmed breast cancer, ER/PR positive each.  She is planning a mastectomy due to more than one lesion in her right breast.   She will have genetic testing done.   Her 38 year old sister had a BI-RADS 3 mammogram and a BI-RADS 4 MRI.  She will see her MD tomorrow and will plan for a biopsy.   Patient has Mirena and much light menses and is satisfied with this.   GYNECOLOGIC HISTORY: Patient's last menstrual period was 06/07/2019 (approximate). Contraception: Tubal/Mirena IUD 03-22-19 Menopausal hormone therapy:  n/a Last mammogram: 05-31-19 3D/Neg/density C/BiRads1 Last pap smear: 10-15-17 Neg:Neg HR HPV,04-20-14 Neg:Neg HR HPV        OB History    Gravida  3   Para  2   Term  2   Preterm      AB  1   Living  2     SAB  1   TAB      Ectopic      Multiple      Live Births  2              Patient Active Problem List   Diagnosis Date Noted  . Discomfort in chest 10/29/2016  . Chronic fatigue 10/29/2016  . Allergic rhinitis due to allergen 10/29/2016  . Vitamin D deficiency 10/29/2016    Past Medical History:  Diagnosis Date  . Anemia   . Anxiety   . Endometrial polyp 03/22/2019  . Fibroid   . Heart murmur    pt. thinks she has a benign  murmur dx'd during pregnancy  . Mitral regurgitation    a. Prior 2D echo in 2014 showed technically limited study, EF 60%, mild MR, trace TR.    Past Surgical History:  Procedure Laterality Date  . CESAREAN SECTION  2009  . TUBAL LIGATION  2009    Current Outpatient Medications  Medication Sig Dispense Refill  . ALPRAZolam (XANAX) 0.5 MG tablet TAKE 1/2-1 TABLET ONCE UP TO THREE TIMES DAILY AS NEEDED.    . Ascorbic Acid (VITAMIN C) 1000 MG tablet Take 1,000 mg by mouth daily.    . cetirizine (ZYRTEC) 10 MG tablet Take 1 tablet by mouth as needed.    . Cholecalciferol (VITAMIN D3 PO) Take 1 capsule by mouth daily. Takes 1000 units daily    . ferrous sulfate 325 (65 FE) MG tablet Take by mouth daily.    Marland Kitchen FLUoxetine (PROZAC) 20 MG capsule Take 2 capsules by mouth daily.   0  . levonorgestrel (MIRENA) 20 MCG/24HR IUD 1 each by Intrauterine route once.    . polyethylene glycol (MIRALAX / GLYCOLAX) packet  Take 17 g by mouth daily as needed. Takes 1/2 capful daily      No current facility-administered medications for this visit.     ALLERGIES: Sulfamethoxazole-trimethoprim  Family History  Problem Relation Age of Onset  . Hypertension Mother   . Arrhythmia Mother   . Depression Mother   . Heart disease Mother   . Heart failure Mother   . Diabetes Mother   . Hypertension Father   . Cancer Paternal Grandmother        tumor in spine  . Dementia Paternal Grandfather   . Breast cancer Sister 59    Social History   Socioeconomic History  . Marital status: Married    Spouse name: Not on file  . Number of children: Not on file  . Years of education: Not on file  . Highest education level: Not on file  Occupational History  . Occupation: housewife  Tobacco Use  . Smoking status: Never Smoker  . Smokeless tobacco: Never Used  Substance and Sexual Activity  . Alcohol use: No    Alcohol/week: 0.0 standard drinks  . Drug use: No  . Sexual activity: Yes    Partners: Male     Birth control/protection: Surgical    Comment: Tubal  Other Topics Concern  . Not on file  Social History Narrative   Lives with husband and children.    Housewife.    Social Determinants of Health   Financial Resource Strain:   . Difficulty of Paying Living Expenses:   Food Insecurity:   . Worried About Charity fundraiser in the Last Year:   . Arboriculturist in the Last Year:   Transportation Needs:   . Film/video editor (Medical):   Marland Kitchen Lack of Transportation (Non-Medical):   Physical Activity:   . Days of Exercise per Week:   . Minutes of Exercise per Session:   Stress:   . Feeling of Stress :   Social Connections:   . Frequency of Communication with Friends and Family:   . Frequency of Social Gatherings with Friends and Family:   . Attends Religious Services:   . Active Member of Clubs or Organizations:   . Attends Archivist Meetings:   Marland Kitchen Marital Status:   Intimate Partner Violence:   . Fear of Current or Ex-Partner:   . Emotionally Abused:   Marland Kitchen Physically Abused:   . Sexually Abused:     Review of Systems  All other systems reviewed and are negative.   PHYSICAL EXAMINATION:    Ht 5' 5.5" (1.664 m)   LMP 06/07/2019 (Approximate)   BMI 23.93 kg/m     General appearance: alert, cooperative and appears stated age   ASSESSMENT  Personal increased lifetime risk of breast cancer.  FH breast cancer.  Mirena.  Hx anemia and ferritin transfusions.   PLAN  Will refer for genetic counseling and testing.  Will proceed with MRI of the breast.   Will then refer her to high risk breast cancer clinic.  Ok to continue with Mirena IUD at this time as the current benefit outweighs risk of this low hormonal exposure.   An After Visit Summary was printed and given to the patient.  ___28___ minutes face to face time of which over 50% was spent in counseling.

## 2019-06-21 ENCOUNTER — Telehealth (INDEPENDENT_AMBULATORY_CARE_PROVIDER_SITE_OTHER): Payer: Managed Care, Other (non HMO) | Admitting: Obstetrics and Gynecology

## 2019-06-21 ENCOUNTER — Encounter: Payer: Self-pay | Admitting: Obstetrics and Gynecology

## 2019-06-21 ENCOUNTER — Other Ambulatory Visit: Payer: Self-pay

## 2019-06-21 VITALS — Ht 65.5 in

## 2019-06-21 DIAGNOSIS — Z803 Family history of malignant neoplasm of breast: Secondary | ICD-10-CM | POA: Diagnosis not present

## 2019-06-21 DIAGNOSIS — Z9189 Other specified personal risk factors, not elsewhere classified: Secondary | ICD-10-CM

## 2019-06-30 ENCOUNTER — Telehealth: Payer: Self-pay | Admitting: Obstetrics and Gynecology

## 2019-06-30 NOTE — Telephone Encounter (Signed)
Second call placed to follow up for referral to [facility] at [facility]. Unable to leave a message the voicemail is full.

## 2019-07-06 ENCOUNTER — Ambulatory Visit (INDEPENDENT_AMBULATORY_CARE_PROVIDER_SITE_OTHER): Payer: Managed Care, Other (non HMO) | Admitting: Podiatry

## 2019-07-06 ENCOUNTER — Other Ambulatory Visit: Payer: Self-pay

## 2019-07-06 VITALS — Temp 97.3°F

## 2019-07-06 DIAGNOSIS — M79674 Pain in right toe(s): Secondary | ICD-10-CM | POA: Diagnosis not present

## 2019-07-06 DIAGNOSIS — M2041 Other hammer toe(s) (acquired), right foot: Secondary | ICD-10-CM

## 2019-07-06 DIAGNOSIS — M205X1 Other deformities of toe(s) (acquired), right foot: Secondary | ICD-10-CM

## 2019-07-18 ENCOUNTER — Telehealth: Payer: Self-pay

## 2019-07-18 ENCOUNTER — Other Ambulatory Visit: Payer: Self-pay | Admitting: Obstetrics and Gynecology

## 2019-07-18 DIAGNOSIS — Z30431 Encounter for routine checking of intrauterine contraceptive device: Secondary | ICD-10-CM

## 2019-07-18 DIAGNOSIS — N926 Irregular menstruation, unspecified: Secondary | ICD-10-CM

## 2019-07-18 NOTE — Telephone Encounter (Signed)
Patient is calling about possibly wanting appointment. Patient stated she had an IUD placed in January and had spotting, now in May has had a heavy period for the past 12 days. Patient stated she was seen at her PCP this morning for blood work and the PCP will be sending over results to our office for Dr. [name]. Patient also stated she is on the cancel list for a breast MRI and would like to discuss if bleeding will interfere with her being able to take the appointment for the MRI.

## 2019-07-18 NOTE — Telephone Encounter (Signed)
Spoke with patient. Mirena IUD placed 02/2019. Patient reports light menses with IUD initially, reports long and heavy menses that started 07/07/19, still spotting, flow heavy at times. Denies any other GYN symptoms or pain. Was seen by PCP today for headache, labs completed -CBC, ferritin, iron, liver and kidney function. Patient has requested a copy of the labs be sent to Dr. [name] once completed. Patient reports increased stress due to family illness, is unsure if this is the cause for the longer, heavier menses.   Patient is scheduled for Breast MRI on 5/26, has spoken with [facility], ok to proceed as scheduled.   Advised patient I will review with Dr. [name] prior to scheduling, PUS may be recommended. Patient agreeable.   Dr. [name] -please advise on OV vs PUS.

## 2019-07-19 ENCOUNTER — Ambulatory Visit
Admission: RE | Admit: 2019-07-19 | Discharge: 2019-07-19 | Disposition: A | Discharge status: Home / Self Care | Payer: Managed Care, Other (non HMO) | Source: Ambulatory Visit | Attending: Obstetrics and Gynecology | Admitting: Obstetrics and Gynecology

## 2019-07-19 ENCOUNTER — Other Ambulatory Visit: Payer: Self-pay

## 2019-07-19 DIAGNOSIS — Z9189 Other specified personal risk factors, not elsewhere classified: Secondary | ICD-10-CM

## 2019-07-19 MED ORDER — GADOBUTROL 1 MMOL/ML IV SOLN
6.0000 mL | Freq: Once | INTRAVENOUS | Status: AC | PRN
  Administered 2019-07-19: 6 mL via INTRAVENOUS

## 2019-07-19 NOTE — Telephone Encounter (Signed)
I would recommend both office visit and ultrasound to check the IUD.

## 2019-07-20 NOTE — Telephone Encounter (Signed)
Spoke with patient. Patient reports bleeding has decreased, only notices spotting when she wipes. PUS scheduled for 6/10 at 9am, consult to follow at 9:30am. Patient declined earlier appt due to work schedule, is aware to contact office if any changes in symptoms. Order placed for precert.   07/18/19 labs from PCP available in Care Everywhere.   Patient verbalizes understanding and is agreeable.   Routing to provider for final review. Patient is agreeable to disposition. Will close encounter.  Cc: Hayley Carder

## 2019-07-26 ENCOUNTER — Telehealth: Payer: Self-pay | Admitting: Obstetrics and Gynecology

## 2019-07-26 NOTE — Telephone Encounter (Signed)
Patient calling for breast mri results.

## 2019-07-26 NOTE — Telephone Encounter (Signed)
Nunzio Cobbs, MD  07/21/2019 7:06 AM EDT    Results to patient through My Chart. Please remove from mammogram hold and return to routine screening.  Hi Joanna Yang,   Your breast MRI shows no signs of cancer!  I will see you back in the office for your pelvic ultrasound appointment.   Please contact the office for any questions.   Have a good day!  [name], MD   Spoke with patient. Notified of 07/20/19 Breast MRI results as seen above. Patient verbalizes understanding and is agreeable.   Encounter closed.

## 2019-07-28 ENCOUNTER — Encounter (HOSPITAL_COMMUNITY): Payer: Self-pay | Admitting: Emergency Medicine

## 2019-07-28 ENCOUNTER — Telehealth: Payer: Self-pay

## 2019-07-28 ENCOUNTER — Other Ambulatory Visit: Payer: Self-pay

## 2019-07-28 ENCOUNTER — Emergency Department (HOSPITAL_COMMUNITY)
Admission: EM | Admit: 2019-07-28 | Discharge: 2019-07-29 | Disposition: A | Discharge status: Home / Self Care | Payer: Managed Care, Other (non HMO) | Attending: Emergency Medicine | Admitting: Emergency Medicine

## 2019-07-28 DIAGNOSIS — Y762 Prosthetic and other implants, materials and accessory obstetric and gynecological devices associated with adverse incidents: Secondary | ICD-10-CM | POA: Insufficient documentation

## 2019-07-28 DIAGNOSIS — Z975 Presence of (intrauterine) contraceptive device: Secondary | ICD-10-CM | POA: Diagnosis not present

## 2019-07-28 DIAGNOSIS — N939 Abnormal uterine and vaginal bleeding, unspecified: Secondary | ICD-10-CM | POA: Insufficient documentation

## 2019-07-28 DIAGNOSIS — R58 Hemorrhage, not elsewhere classified: Secondary | ICD-10-CM

## 2019-07-28 DIAGNOSIS — Z79899 Other long term (current) drug therapy: Secondary | ICD-10-CM | POA: Insufficient documentation

## 2019-07-28 DIAGNOSIS — T839XXA Unspecified complication of genitourinary prosthetic device, implant and graft, initial encounter: Secondary | ICD-10-CM | POA: Diagnosis not present

## 2019-07-28 LAB — BASIC METABOLIC PANEL
Anion gap: 13 (ref 5–15)
BUN: 13 mg/dL (ref 6–20)
CO2: 23 mmol/L (ref 22–32)
Calcium: 9.3 mg/dL (ref 8.9–10.3)
Chloride: 100 mmol/L (ref 98–111)
Creatinine, Ser: 0.81 mg/dL (ref 0.44–1.00)
GFR calc Af Amer: >60 mL/min (ref >60)
GFR calc non Af Amer: >60 mL/min (ref >60)
Glucose, Bld: 89 mg/dL (ref 70–99)
Potassium: 4.8 mmol/L (ref 3.5–5.1)
Sodium: 136 mmol/L (ref 135–145)

## 2019-07-28 LAB — CBC
HCT: 42.4 % (ref 36.0–46.0)
Hemoglobin: 14.1 g/dL (ref 12.0–15.0)
MCH: 30.2 pg (ref 26.0–34.0)
MCHC: 33.3 g/dL (ref 30.0–36.0)
MCV: 90.8 fL (ref 80.0–100.0)
Platelets: 225 10*3/uL (ref 150–400)
RBC: 4.67 MIL/uL (ref 3.87–5.11)
RDW: 12.6 % (ref 11.5–15.5)
WBC: 6.3 10*3/uL (ref 4.0–10.5)
nRBC: 0 % (ref 0.0–0.2)

## 2019-07-28 LAB — I-STAT BETA HCG BLOOD, ED (MC, WL, AP ONLY): I-stat hCG, quantitative: <5 m[IU]/mL (ref <5)

## 2019-07-28 NOTE — ED Triage Notes (Addendum)
Patient arrives to ED with complaints of vaginal bleeding since May 14th. Patient states she has a hx of this, so she got a IUD placed in Janurary which has fixed this problem. Patients OBGYN states that she needs an ultrasound. Patient has hx of anemia and shes denies abdominal pain.

## 2019-07-28 NOTE — Telephone Encounter (Signed)
Patient is calling in regards to heavy bleeding. Patient stated she received IUD in January 2021 and at the beginning of  May she started to bleed. Patient states the bleeding has been continuous and has gotten heavy yesterday. Patient states she is filling up multiple pads in the last day.

## 2019-07-28 NOTE — Telephone Encounter (Signed)
Spoke with patient. Patient has a Mirena IUD placed 02/2019. She is scheduled for a PUS on 6/10 for irregular bleeding. Menses started on 07/07/19 and lasted for approximately 2 wks, flow varied. Menses started again on 6/2, has increased today, she has just placed a pad. Patient has Hx of anemia, take iron daily.   Patient wants to be prepared since it is Friday and the office will be closed. She is asking at what point should she return for evaluation or go to the ER?   Patient denies pain, N/V, fever/chills, SHOB, weakness, dizziness, fatigue, headache.   Advised patient she should go to the ER for heavy bleeding, changing saturated pad/tampon q1-2 hours or if any new symptoms develop, such as SHOB, weakness, dizziness, lightheadedness. Keep PUS as scheduled for further evaluation. I will provide update to Dr. [name] and f/u if any additional recommendations. Patient thankful for call and verbalizes understanding.   Routing to Dr. [name] for final review.

## 2019-07-29 ENCOUNTER — Encounter: Payer: Self-pay | Admitting: Obstetrics and Gynecology

## 2019-07-29 ENCOUNTER — Other Ambulatory Visit: Payer: Self-pay | Admitting: Obstetrics and Gynecology

## 2019-07-29 ENCOUNTER — Emergency Department (HOSPITAL_COMMUNITY): Payer: Managed Care, Other (non HMO)

## 2019-07-29 DIAGNOSIS — N921 Excessive and frequent menstruation with irregular cycle: Secondary | ICD-10-CM

## 2019-07-29 DIAGNOSIS — D649 Anemia, unspecified: Secondary | ICD-10-CM

## 2019-07-29 DIAGNOSIS — N946 Dysmenorrhea, unspecified: Secondary | ICD-10-CM

## 2019-07-29 DIAGNOSIS — Z30432 Encounter for removal of intrauterine contraceptive device: Secondary | ICD-10-CM

## 2019-07-29 DIAGNOSIS — N83201 Unspecified ovarian cyst, right side: Secondary | ICD-10-CM

## 2019-07-29 HISTORY — PX: IUD REMOVAL: SHX5392

## 2019-07-29 HISTORY — DX: Unspecified ovarian cyst, right side: N83.201

## 2019-07-29 LAB — CBC
HCT: 40.6 % (ref 36.0–46.0)
Hemoglobin: 13.5 g/dL (ref 12.0–15.0)
MCH: 30.1 pg (ref 26.0–34.0)
MCHC: 33.3 g/dL (ref 30.0–36.0)
MCV: 90.4 fL (ref 80.0–100.0)
Platelets: 184 10*3/uL (ref 150–400)
RBC: 4.49 MIL/uL (ref 3.87–5.11)
RDW: 12.6 % (ref 11.5–15.5)
WBC: 5.2 10*3/uL (ref 4.0–10.5)
nRBC: 0 % (ref 0.0–0.2)

## 2019-07-29 MED ORDER — NORETHINDRONE ACETATE 5 MG PO TABS
5.0000 mg | ORAL_TABLET | Freq: Two times a day (BID) | ORAL | 1 refills | Status: DC
Start: 2019-07-29 — End: 2019-09-27

## 2019-07-29 NOTE — ED Provider Notes (Signed)
[facility] EMERGENCY DEPARTMENT Provider Note   CSN: [id number] Arrival date & time: 07/28/19  1757     History Chief Complaint  Patient presents with  . Vaginal Bleeding    Joanna Yang is a 49 y.o. female.  The history is provided by medical records and the patient.  Vaginal Bleeding   49 year old female, endometrial polyps, presenting to the ED with heavy vaginal bleeding.  States she had an IUD placed in January of this year for control of irregular/heavy menses.  States she had adequate control of this for a few months but starting May 14 she has had intermittent heavy vaginal bleeding.  States she called her doctor's office yesterday and they scheduled her for outpatient ultrasound but told her to come to the ER if soaking through more than 2 pads in 1 hour.  States yesterday afternoon this began occurring and she was concerned.  She has not had any lightheadedness, dizziness, or feelings of syncope.  She does report history of fibroids but states last ultrasound did not show any fibroids.  States she was told that if IUD did not control bleeding she may need hysterectomy.  She denies any significant pelvic pain, does have some lower abdominal cramping which she states feels like menstrual cramps.  She is not had any urinary symptoms.  She does report receiving IV Fe+ infusions in the past, never needed blood transfusions.  Past Medical History:  Diagnosis Date  . Anemia   . Anxiety   . Endometrial polyp 03/22/2019  . Fibroid   . Heart murmur    pt. thinks she has a benign murmur dx'd during pregnancy  . Mitral regurgitation    a. Prior 2D echo in 2014 showed technically limited study, EF 60%, mild MR, trace TR.    Patient Active Problem List   Diagnosis Date Noted  . Discomfort in chest 10/29/2016  . Chronic fatigue 10/29/2016  . Allergic rhinitis due to allergen 10/29/2016  . Vitamin D deficiency 10/29/2016    Past Surgical History:  Procedure  Laterality Date  . CESAREAN SECTION  2009  . TUBAL LIGATION  2009     OB History    Gravida  3   Para  2   Term  2   Preterm      AB  1   Living  2     SAB  1   TAB      Ectopic      Multiple      Live Births  2           Family History  Problem Relation Age of Onset  . Hypertension Mother   . Arrhythmia Mother   . Depression Mother   . Heart disease Mother   . Heart failure Mother   . Diabetes Mother   . Hypertension Father   . Cancer Paternal Grandmother        tumor in spine  . Dementia Paternal Grandfather   . Breast cancer Sister 98    Social History   Tobacco Use  . Smoking status: Never Smoker  . Smokeless tobacco: Never Used  Substance Use Topics  . Alcohol use: No    Alcohol/week: 0.0 standard drinks  . Drug use: No    Home Medications Prior to Admission medications   Medication Sig Start Date End Date Taking? Authorizing Provider  ALPRAZolam (XANAX) 0.5 MG tablet TAKE 1/2-1 TABLET ONCE UP TO THREE TIMES DAILY AS NEEDED. 09/06/18  [provider]  Ascorbic Acid (VITAMIN C) 1000 MG tablet Take 1,000 mg by mouth daily.    [provider]  cetirizine (ZYRTEC) 10 MG tablet Take 1 tablet by mouth as needed.    [provider]  Cholecalciferol (VITAMIN D3 PO) Take 1 capsule by mouth daily. Takes 1000 units daily    [provider]  ferrous sulfate 325 (65 FE) MG tablet Take by mouth daily. 07/01/17   [provider]  FLUoxetine (PROZAC) 20 MG capsule Take 2 capsules by mouth daily.  09/25/16   [provider]  levonorgestrel (MIRENA) 20 MCG/24HR IUD 1 each by Intrauterine route once.    [provider]  polyethylene glycol (MIRALAX / GLYCOLAX) packet Take 17 g by mouth daily as needed. Takes 1/2 capful daily     [provider]    Allergies    Sulfamethoxazole-trimethoprim  Review of Systems   Review of Systems  Genitourinary: Positive for vaginal bleeding.  All other  systems reviewed and are negative.   Physical Exam Updated Vital Signs BP 109/79   Pulse 85   Temp 97.6 F (36.4 C)   Resp 17   Ht 5\' 6"  (1.676 m)   Wt 68 kg   SpO2 98%   BMI 24.21 kg/m   Physical Exam Vitals and nursing note reviewed.  Constitutional:      Appearance: She is well-developed.  HENT:     Head: Normocephalic and atraumatic.  Eyes:     Conjunctiva/sclera: Conjunctivae normal.     Pupils: Pupils are equal, round, and reactive to light.  Cardiovascular:     Rate and Rhythm: Normal rate and regular rhythm.     Heart sounds: Normal heart sounds.  Pulmonary:     Effort: Pulmonary effort is normal.     Breath sounds: Normal breath sounds.  Abdominal:     General: Bowel sounds are normal.     Palpations: Abdomen is soft.     Tenderness: There is no abdominal tenderness. There is no guarding or rebound.     Comments: Soft, non-tender  Musculoskeletal:        General: Normal range of motion.     Cervical back: Normal range of motion.  Skin:    General: Skin is warm and dry.  Neurological:     Mental Status: She is alert and oriented to person, place, and time.     ED Results / Procedures / Treatments   Labs (all labs ordered are listed, but only abnormal results are displayed) Labs Reviewed  CBC  BASIC METABOLIC PANEL  I-STAT BETA HCG BLOOD, ED ([facility], [facility], [facility] ONLY)    EKG None  Radiology No results found.  Procedures Procedures (including critical care time)  Medications Ordered in ED Medications - No data to display  ED Course  I have reviewed the triage vital signs and the nursing notes.  Pertinent labs & imaging results that were available during my care of the patient were reviewed by me and considered in my medical decision making (see chart for details).    MDM Rules/Calculators/A&P  49 year old female presenting to the ED with heavy vaginal bleeding.  States she had IUDs in January to help with this and had good relief for a few  months, heavy bleeding began on 07/07/2019.  She did call her GYN and was told she likely needed ultrasound which has been scheduled for next week, however her bleeding worsened this evening.  She was soaking through 2+ pads  in an hour, however seems to have tapered off a little bit since she arrived in the ED.  She is afebrile and nontoxic, hemodynamically stable.  Her labs are reassuring with stable hemoglobin.  Her abdomen is soft and very benign.  Will go ahead and obtain ultrasound today.  6:42 AM Care signed out to oncoming provider pending [place].  Anticipate discharge with OB-GYN follow-up if no emergent findings.  Final Clinical Impression(s) / ED Diagnoses Final diagnoses:  Bleeding  Vaginal bleeding    Rx / DC Orders ED Discharge Orders    None       Larene Pickett, PA-C 07/29/19 4562    Merrily Pew, MD 07/29/19 [phone]

## 2019-07-29 NOTE — ED Provider Notes (Signed)
  Physical Exam  BP 103/67 (BP Location: Right Arm)   Pulse 74   Temp (!) 97.2 F (36.2 C) (Temporal)   Resp 16   Ht 5\' 6"  (1.676 m)   Wt 68 kg   SpO2 98%   BMI 24.21 kg/m   Physical Exam  ED Course/Procedures   Clinical Course as of Jul 28 1053  Sat Jul 29, 2019  0834 I attempted pelvic exam on this patient. Patient has extensive, continuous heavy blood flow with extensive clots. Unable to visualize anything. Will repeat CBC (last one was yesterday evening) and consult obgyn for likely admission for  IUD complication given the amount of bleeding and ultrasound findings   [KM]  0954 Dr. Yisroel Ramming to see patient in ED.    [KM]    Clinical Course User Index [KM] Alveria Apley, PA-C    Procedures  MDM  Care signed out to myself from Quincy Carnes PA due to change of shift. See her note for full HPI.  Patient with vaginal bleeding, stable. Ultrasound shows malpositioned IUD. Pelvic exam not previously performed. Will performed pelvic in attempt to visualize IUD       Kristine Royal 07/29/19 1147    Davonna Belling, MD 07/29/19 [phone]

## 2019-07-29 NOTE — Telephone Encounter (Signed)
Patient was seen in [facility] ER this am for heavy vaginal bleeding, a normal hemoglobin, and ultrasound showing her IUD low in the endocervical canal.   I removed the IUD and started her on Aygestin 5 mg po bid.   She will see me on 07/31/19 in the office at 8:30 am.

## 2019-07-29 NOTE — Consult Note (Signed)
GYNECOLOGY  VISIT   HPI: 49 y.o.   Married  Caucasian  female   [phone] with No LMP recorded. (Menstrual status: IUD).   Now in the [facility] ER for  Heavy vaginal bleeding.  I was called in consultation this am about 9:45 am.   Patient has a history of menorrhagia and anemia and had her Mirena  IUD placed 03/22/19 following a normal pelvic US 02/02/19.   She had a concurrent endometrial biopsy on the day of the Mirena IUD insertion, and this detected a benign endometrial polyp.  Following the IUD placement, she had only spotting January - April.  In the middle of May, she started having more regular bleeding, which increased 2 days ago.   Yesterday, she became pale she states her husband, and she then presented to the ER for care.   She noted some cramping yesterday.   In the ER, she had a negative hCG and was followed with serial CBCs.  Hgb 14.1 07/28/19 at 19:37. Hgb 13.5 today at 9:20.   Pelvic US today shows a uterus with no fibroids, her IUD is low at the level of the endocervical canal along with echogenic products consistent with blood.  Her right ovary contains a simple cyst 6.9 x 4.9 x 5.1 cm.  No torsion noted.   Left ovary was not seen.  No free fluid noted.   GYNECOLOGIC HISTORY: No LMP recorded. (Menstrual status: IUD). Contraception:  Mirena Menopausal hormone therapy: NA Last mammogram:  Breast MRI 07/20/19 - no evidence of malignancy Last pap smear:  10/15/17 - normal and neg HR HPV        OB History    Gravida  3   Para  2   Term  2   Preterm      AB  1   Living  2     SAB  1   TAB      Ectopic      Multiple      Live Births  2              Patient Active Problem List   Diagnosis Date Noted  . Discomfort in chest 10/29/2016  . Chronic fatigue 10/29/2016  . Allergic rhinitis due to allergen 10/29/2016  . Vitamin D deficiency 10/29/2016    Past Medical History:  Diagnosis Date  . Anemia   . Anxiety   . Endometrial polyp 03/22/2019   . Fibroid   . Heart murmur    pt. thinks she has a benign murmur dx'd during pregnancy  . Mitral regurgitation    a. Prior 2D echo in 2014 showed technically limited study, EF 60%, mild MR, trace TR.    Past Surgical History:  Procedure Laterality Date  . CESAREAN SECTION  2009  . TUBAL LIGATION  2009    No current facility-administered medications for this encounter.   Current Outpatient Medications  Medication Sig Dispense Refill  . acetaminophen (TYLENOL) 500 MG tablet Take 1,000 mg by mouth 2 (two) times daily as needed.    . ALPRAZolam (XANAX) 0.5 MG tablet Take 0.25-0.5 mg by mouth 3 (three) times daily as needed for anxiety.     . Ascorbic Acid (VITAMIN C) 1000 MG tablet Take 1,000 mg by mouth daily.    . cetirizine (ZYRTEC) 10 MG tablet Take 1 tablet by mouth as needed for allergies.     . Cholecalciferol (VITAMIN D-3) 25 MCG (1000 UT) CAPS Take 1 capsule by mouth daily.    Marland Kitchen  ferrous sulfate 325 (65 FE) MG tablet Take by mouth daily.    Marland Kitchen FLUoxetine (PROZAC) 20 MG capsule Take 2 capsules by mouth daily.   0  . polyethylene glycol (MIRALAX / GLYCOLAX) packet Take 17 g by mouth daily as needed for mild constipation. Takes 1/2 capful daily     . levonorgestrel (MIRENA) 20 MCG/24HR IUD 1 each by Intrauterine route once.    . traMADol (ULTRAM) 50 MG tablet Take 50 mg by mouth 4 (four) times daily as needed for moderate pain.       ALLERGIES: Sulfamethoxazole-trimethoprim  Family History  Problem Relation Age of Onset  . Hypertension Mother   . Arrhythmia Mother   . Depression Mother   . Heart disease Mother   . Heart failure Mother   . Diabetes Mother   . Hypertension Father   . Cancer Paternal Grandmother        tumor in spine  . Dementia Paternal Grandfather   . Breast cancer Sister 41    Social History   Socioeconomic History  . Marital status: Married    Spouse name: Not on file  . Number of children: Not on file  . Years of education: Not on file  .  Highest education level: Not on file  Occupational History  . Occupation: housewife  Tobacco Use  . Smoking status: Never Smoker  . Smokeless tobacco: Never Used  Substance and Sexual Activity  . Alcohol use: No    Alcohol/week: 0.0 standard drinks  . Drug use: No  . Sexual activity: Yes    Partners: Male    Birth control/protection: Surgical    Comment: Tubal  Other Topics Concern  . Not on file  Social History Narrative   Lives with husband and children.    Housewife.    Social Determinants of Health   Financial Resource Strain:   . Difficulty of Paying Living Expenses:   Food Insecurity:   . Worried About Charity fundraiser in the Last Year:   . Arboriculturist in the Last Year:   Transportation Needs:   . Film/video editor (Medical):   Marland Kitchen Lack of Transportation (Non-Medical):   Physical Activity:   . Days of Exercise per Week:   . Minutes of Exercise per Session:   Stress:   . Feeling of Stress :   Social Connections:   . Frequency of Communication with Friends and Family:   . Frequency of Social Gatherings with Friends and Family:   . Attends Religious Services:   . Active Member of Clubs or Organizations:   . Attends Archivist Meetings:   Marland Kitchen Marital Status:   Intimate Partner Violence:   . Fear of Current or Ex-Partner:   . Emotionally Abused:   Marland Kitchen Physically Abused:   . Sexually Abused:     Review of Systems   See HPI.   PHYSICAL EXAMINATION:    BP 103/67 (BP Location: Right Arm)   Pulse 74   Temp (!) 97.2 F (36.2 C) (Temporal)   Resp 16   Ht 5\' 6"  (1.676 m)   Wt 68 kg   SpO2 98%   BMI 24.21 kg/m     General appearance: alert, cooperative and appears stated age   Abdomen:  Soft, mild tenderness of the lower abdomen.  No guarding or rebound.   Pelvic: External genitalia:  no lesions              Urethra:  normal appearing  urethra with no masses, tenderness or lesions              Bartholins and Skenes: normal                  Vagina: normal appearing vagina with normal color and discharge, no lesions              Cervix: no lesions.  Blood in vaginal vault and clot coming from the cervix.  IUD sting present.                 Bimanual Exam:  Uterus:  normal size, contour, position, consistency, mobility, non-tender              Adnexa: no mass, fullness, tenderness on left.  Fullness of adnexa on right, nontender.              IUD removal  Verbal consent procedure.  Ring forceps used to grasp strings, remove IUD intact, shown to patient, and then discarded.  Chaperone was present for exam.  ASSESSMENT  Menorrhagia.  Expelling Mirena IUD.  Right ovarian cyst.  No evidence of torsion on exam or ultrasound.  PLAN  I reviewed the patient's ultrasound findings and recommended for the IUD removal.  She will start Aygestin 5 mg po bid.  We started discussing options for care including dilation and curettage, endometrial ablation, and laparoscopic hysterectomy.  She is leaning toward definitive treatment for her bleeding.  Follow up in the office in 2 days.  She will call me if she has continued heavy bleeding, symptoms of anemia or significant pain.  Torsion precautions given.  __30____ minutes face to face time of which over 50% was spent in counseling.

## 2019-07-29 NOTE — Discharge Instructions (Addendum)
You were seen today for vaginal bleeding. It appears you had a complication from your IUD. Dr. [name] saw you and removed your IUD today. Please be sure to follow up with her in her office. Thank you for allowing me to care for you today. Please return to the emergency department if you have new or worsening symptoms. Take your medications as instructed.

## 2019-07-29 NOTE — ED Notes (Signed)
Transported to ultrasound

## 2019-07-31 ENCOUNTER — Encounter: Payer: Self-pay | Admitting: Obstetrics and Gynecology

## 2019-07-31 ENCOUNTER — Other Ambulatory Visit: Payer: Self-pay

## 2019-07-31 ENCOUNTER — Ambulatory Visit (INDEPENDENT_AMBULATORY_CARE_PROVIDER_SITE_OTHER): Payer: Managed Care, Other (non HMO) | Admitting: Obstetrics and Gynecology

## 2019-07-31 VITALS — BP 122/62 | HR 100 | Temp 97.8°F | Ht 65.5 in | Wt 150.6 lb

## 2019-07-31 DIAGNOSIS — N921 Excessive and frequent menstruation with irregular cycle: Secondary | ICD-10-CM | POA: Diagnosis not present

## 2019-07-31 DIAGNOSIS — N83201 Unspecified ovarian cyst, right side: Secondary | ICD-10-CM | POA: Diagnosis not present

## 2019-07-31 NOTE — Telephone Encounter (Signed)
Ok to cancel pelvic US.

## 2019-07-31 NOTE — Patient Instructions (Signed)
Take your Aygestin 5 mg twice a day for 2 weeks and then decrease to 5 mg once daily.  We will do your next pelvic ultrasound in 6 weeks.

## 2019-07-31 NOTE — Telephone Encounter (Signed)
Patient added to schedule for today at 8:30am.  Dr. [name] -ok to cancel upcoming PUS appt?

## 2019-07-31 NOTE — Progress Notes (Signed)
GYNECOLOGY  VISIT   HPI: 49 y.o.   Married  [place]  female   [phone] with No LMP recorded.   here for ER follow up from 07/29/19.  She had heavy bleeding, and went to [facility] ER.  A pelvic US showed her Mirena IUD was expelling and that she has a right ovarian cyst 44 x 42 x 49 mm with right ovary measuring 69 x 49 x 51 mm.  No torsion noted. Her hemoglobin was normal and she had a negative pregnancy test.  I removed the IUD in the ER. She was started on Aygestin 5 mg po bid.  Patient states vaginal bleeding is much better now.  Changing her pad twice a day.   Has bloating of her abdomen.    She has a hx of anemia and iron transfusions.   GYNECOLOGIC HISTORY: No LMP recorded. Contraception: tubal Menopausal hormone therapy: Aygestin 5mg  bid Last mammogram: 07-20-19 Bil.Br.MRI/Neg/density C/BiRads1 Last pap smear: 10-15-17 Neg:Neg HR HPV,04-20-14 Neg:Neg HR HPV        OB History    Gravida  3   Para  2   Term  2   Preterm      AB  1   Living  2     SAB  1   TAB      Ectopic      Multiple      Live Births  2              Patient Active Problem List   Diagnosis Date Noted  . Discomfort in chest 10/29/2016  . Chronic fatigue 10/29/2016  . Allergic rhinitis due to allergen 10/29/2016  . Vitamin D deficiency 10/29/2016    Past Medical History:  Diagnosis Date  . Anemia   . Anxiety   . Endometrial polyp 03/22/2019  . Fibroid   . Heart murmur    pt. thinks she has a benign murmur dx'd during pregnancy  . Mitral regurgitation    a. Prior 2D echo in 2014 showed technically limited study, EF 60%, mild MR, trace TR.  . Right ovarian cyst 07/29/2019    Past Surgical History:  Procedure Laterality Date  . CESAREAN SECTION  2009  . IUD REMOVAL  07/29/2019   expelling IUD  . TUBAL LIGATION  2009    Current Outpatient Medications  Medication Sig Dispense Refill  . acetaminophen (TYLENOL) 500 MG tablet Take 1,000 mg by mouth 2 (two) times daily as  needed.    . ALPRAZolam (XANAX) 0.5 MG tablet Take 0.25-0.5 mg by mouth 3 (three) times daily as needed for anxiety.     . Ascorbic Acid (VITAMIN C) 1000 MG tablet Take 1,000 mg by mouth daily.    . cetirizine (ZYRTEC) 10 MG tablet Take 1 tablet by mouth as needed for allergies.     . Cholecalciferol (VITAMIN D-3) 25 MCG (1000 UT) CAPS Take 1 capsule by mouth daily.    . ferrous sulfate 325 (65 FE) MG tablet Take by mouth daily.    Marland Kitchen FLUoxetine (PROZAC) 20 MG capsule Take 2 capsules by mouth daily.   0  . norethindrone (AYGESTIN) 5 MG tablet Take 1 tablet (5 mg total) by mouth in the morning and at bedtime. 60 tablet 1  . polyethylene glycol (MIRALAX / GLYCOLAX) packet Take 17 g by mouth daily as needed for mild constipation. Takes 1/2 capful daily      No current facility-administered medications for this visit.     ALLERGIES: Sulfamethoxazole-trimethoprim  Family History  Problem Relation Age of Onset  . Hypertension Mother   . Arrhythmia Mother   . Depression Mother   . Heart disease Mother   . Heart failure Mother   . Diabetes Mother   . Hypertension Father   . Cancer Paternal Grandmother        tumor in spine  . Dementia Paternal Grandfather   . Breast cancer Sister 65    Social History   Socioeconomic History  . Marital status: Married    Spouse name: Not on file  . Number of children: Not on file  . Years of education: Not on file  . Highest education level: Not on file  Occupational History  . Occupation: housewife  Tobacco Use  . Smoking status: Never Smoker  . Smokeless tobacco: Never Used  Substance and Sexual Activity  . Alcohol use: No    Alcohol/week: 0.0 standard drinks  . Drug use: No  . Sexual activity: Yes    Partners: Male    Birth control/protection: Surgical    Comment: Tubal  Other Topics Concern  . Not on file  Social History Narrative   Lives with husband and children.    Housewife.    Social Determinants of Health   Financial  Resource Strain:   . Difficulty of Paying Living Expenses:   Food Insecurity:   . Worried About Charity fundraiser in the Last Year:   . Arboriculturist in the Last Year:   Transportation Needs:   . Film/video editor (Medical):   Marland Kitchen Lack of Transportation (Non-Medical):   Physical Activity:   . Days of Exercise per Week:   . Minutes of Exercise per Session:   Stress:   . Feeling of Stress :   Social Connections:   . Frequency of Communication with Friends and Family:   . Frequency of Social Gatherings with Friends and Family:   . Attends Religious Services:   . Active Member of Clubs or Organizations:   . Attends Archivist Meetings:   Marland Kitchen Marital Status:   Intimate Partner Violence:   . Fear of Current or Ex-Partner:   . Emotionally Abused:   Marland Kitchen Physically Abused:   . Sexually Abused:     Review of Systems  All other systems reviewed and are negative.   PHYSICAL EXAMINATION:    BP 122/62   Pulse 100   Temp 97.8 F (36.6 C) (Temporal)   Ht 5' 5.5" (1.664 m)   Wt 150 lb 9.6 oz (68.3 kg)   BMI 24.68 kg/m     General appearance: alert, cooperative and appears stated age  Pelvic: External genitalia:  no lesions              Urethra:  normal appearing urethra with no masses, tenderness or lesions              Bartholins and Skenes: normal                 Vagina: normal appearing vagina with normal color and discharge, no lesions              Cervix: no lesions.  Old blood in the vagina.  No active bleeding.                 Bimanual Exam:  Uterus:  normal size, contour, position, consistency, mobility, non-tender              Adnexa: no mass, fullness,  tenderness         Chaperone was present for exam.  ASSESSMENT  Menorrhagia with irregular bleeding.  Expelled IUD removed.  Right ovarian cyst.    PLAN  We discussed options for care - hysteroscopy with dilation and curettage +/- endometrial ablation, total laparoscopic hysterectomy with bilateral  salpingectomy +/- right oophorectomy if needed, medical therapy with Aygestin and tapering down.  She will continue with Aygestin 5 mg po bid for 2 weeks and then decrease to 5 mg daily. FU [place] in 6 weeks.    An After Visit Summary was printed and given to the patient.  __30____ minutes face to face time of which over 50% was spent in counseling.

## 2019-07-31 NOTE — Telephone Encounter (Signed)
Patient seen in office today. PUS cancelled for 08/03/19.  Per review of 07/31/19 OV notes, patient to return in 6wks for f/u US.   Call to patient. Patient r/s for PUS on 08/15/19 at 10:30am, consult to follow. Patient verbalizes understanding and is agreeable.   Routing to provider for final review. Patient is agreeable to disposition. Will close encounter.  Cc: Hayley Carder

## 2019-08-03 ENCOUNTER — Other Ambulatory Visit: Payer: Self-pay | Admitting: Obstetrics and Gynecology

## 2019-08-03 ENCOUNTER — Other Ambulatory Visit: Payer: Self-pay

## 2019-08-17 ENCOUNTER — Telehealth: Payer: Self-pay | Admitting: Obstetrics and Gynecology

## 2019-08-17 NOTE — Telephone Encounter (Signed)
Patient has a medication question. 

## 2019-08-17 NOTE — Telephone Encounter (Signed)
Spoke with patient. Started Aygestin 5mg  PO bid on 6/7 for 2wks, reduced to 5 mg PO daily on 6/20. Patient reports spotting while taking bid. Bleeding and cramps increased on 6/22 to what she thinks may be her cycle, "feels like menses cramps" and "flow is consistent with menses". Changing pad twice a day. Was experiencing insomnia when taking Aygestin bid.   Patient is unsure when her last menses was to determine if this may be her menses or if the bleeding started because she reduced the aygestin. Would like to stay at lower dose if ok. Advised patient I will provide update to Dr. [name] and f/u with recommendations. Patient agreeable.   Routing to Dr. [name] for review.

## 2019-08-18 NOTE — Telephone Encounter (Signed)
OK to continue on Aygestin 5 mg daily and keep her follow up appointment for 6 week recheck.  Have her keep a bleeding calendar.  If she starts with heavy bleeding again, I recommend increasing to 5 mg twice daily.

## 2019-08-18 NOTE — Telephone Encounter (Signed)
Call to patient, left detailed message, ok per dpr, name identified on voicemail. Advised per Dr. [name]. Return call to office if any additional questions.   Encounter closed.

## 2019-08-20 ENCOUNTER — Telehealth: Payer: Self-pay | Admitting: Obstetrics and Gynecology

## 2019-08-20 NOTE — Telephone Encounter (Signed)
The patient called, she went down on the aygestin to 5 mg qd last Sunday. She has been spotting this week, heavier today. She is saturating a pad ~q3 hours, feels like a lot of blood comes out when she is on the toilet.  I advised her to increase back to aygestin 5 mg BID and have her f/u with Dr [name]. Will ask the phone nurse to call her in the am.  CC: Dr [name]

## 2019-08-21 NOTE — Telephone Encounter (Signed)
Spoke with patient. She increased aygestin 5mg  to BID on 6/27. Bleeding has reduced "as far as she can tell". Patient states she has not been up and moving this morning, bleeding usually is heavier with movement. Patient will continue to monitor, will return call to office if bleeding is heavy, changing saturated pad/tampon q1-2hrs, or if any new symptoms develop.   Patient reports fatigue. Denies SHOB, weakness, dizziness, lightheadedness.   Patient is concerned about long term use of Aygestin BID. She is asking how long she may be on the bid dose? How long before she should switch back to one a day?   Recommended patient keep bleeding calendar as previously suggested. Keep f/u as scheduled. Patient is currently scheduled for PUS on 09/14/19. Advised I will provide update to Dr. [name] and f/u with any additional recommendations. Patient agreeable.   Routing to Dr. [name].

## 2019-08-21 NOTE — Telephone Encounter (Signed)
Spoke with patient, advised per Dr. Silva. Patient verbalizes understanding and is agreeable.  Encounter closed.  

## 2019-08-21 NOTE — Telephone Encounter (Signed)
Please have her continue with Aygestin 5 mg po bid at least until her next appointment with me in July.

## 2019-08-23 ENCOUNTER — Other Ambulatory Visit: Payer: Self-pay | Admitting: Obstetrics and Gynecology

## 2019-09-06 NOTE — Progress Notes (Signed)
  Subjective:  Patient ID: Joanna Yang, female    DOB: [birth year],  MRN: [id number]  Chief Complaint  Patient presents with  . Skin Problem    R foot, between toes 4-5. x3 months. Pt stated, "My dermatologist said that it's a corn. He pulled out the dead skin, and it felt better. It's very painful and sensitive again. I'm worried about skin cancer because I previously spent a lot of time in the sun".    49 y.o. female presents with the above complaint. History confirmed with patient.   Objective:  Physical Exam: warm, good capillary refill, no trophic changes or ulcerative lesions, normal DP and PT pulses and normal sensory exam. Left Foot: normal exam, no swelling, tenderness, instability; ligaments intact, full range of motion of all ankle/foot joints  Right Foot: Hammertoe deformities with interdigital corn fourth fifth toes  No images are attached to the encounter.  Assessment:   1. Hammertoe of right foot   2. Pain in toe of right foot   3. Adductovarus rotation of toe, acquired, right      Plan:  Patient was evaluated and treated and all questions answered.  Hammertoe right foot -Educated on etiology of deformity -Discussed padding and shoe gear changes -Dispensed toe spacers -Discussed with patient that they would benefit from surgical intervention if they fail all conservative therapy. -Needs x-rays right foot next visit  Return in about 2 months (around 09/05/2019) for Corn/Hammertoe f/u .

## 2019-09-07 ENCOUNTER — Ambulatory Visit: Payer: Managed Care, Other (non HMO) | Admitting: Podiatry

## 2019-09-14 ENCOUNTER — Other Ambulatory Visit: Payer: Self-pay

## 2019-09-14 ENCOUNTER — Ambulatory Visit (INDEPENDENT_AMBULATORY_CARE_PROVIDER_SITE_OTHER): Payer: Managed Care, Other (non HMO)

## 2019-09-14 ENCOUNTER — Telehealth: Payer: Self-pay | Admitting: Obstetrics and Gynecology

## 2019-09-14 ENCOUNTER — Ambulatory Visit (INDEPENDENT_AMBULATORY_CARE_PROVIDER_SITE_OTHER): Payer: Managed Care, Other (non HMO) | Admitting: Obstetrics and Gynecology

## 2019-09-14 ENCOUNTER — Encounter: Payer: Self-pay | Admitting: Obstetrics and Gynecology

## 2019-09-14 VITALS — BP 100/70 | HR 80 | Ht 65.5 in | Wt 154.8 lb

## 2019-09-14 DIAGNOSIS — N921 Excessive and frequent menstruation with irregular cycle: Secondary | ICD-10-CM

## 2019-09-14 DIAGNOSIS — Z30431 Encounter for routine checking of intrauterine contraceptive device: Secondary | ICD-10-CM

## 2019-09-14 DIAGNOSIS — Z8639 Personal history of other endocrine, nutritional and metabolic disease: Secondary | ICD-10-CM

## 2019-09-14 DIAGNOSIS — N83201 Unspecified ovarian cyst, right side: Secondary | ICD-10-CM

## 2019-09-14 DIAGNOSIS — N926 Irregular menstruation, unspecified: Secondary | ICD-10-CM

## 2019-09-14 DIAGNOSIS — Z803 Family history of malignant neoplasm of breast: Secondary | ICD-10-CM | POA: Diagnosis not present

## 2019-09-14 NOTE — Progress Notes (Signed)
GYNECOLOGY  VISIT   HPI: 49 y.o.   Married [place] female   [phone] with Patient's last menstrual period was 08/14/2019 (exact date).   here for pelvic ultrasound for recheck of right ovarian cyst: 44 x  42 x 49 mm noted at time of ER visit for menorrhagia.  Her IUD was expelling at the time, and I removed it.   I placed her on Aygestin.   Cramping yesterday and today started with vaginal bleeding today.  This is light bleeding.   Since her visit to the ER, she has been taking Aygestin 5 mg po bid for 2 weeks.  Then we reduced her to Aygestin 5 mg daily but her bleeding increased again so she returned to twice daily and her bleeding did resolve.   Her bleeding is coming and going and is not stopping.   She has some left sided lower abdominal pain since her C/S.  She does not have significant pain during her menstruation.   She had an endometrial biopsy at the time of her Mirena IUD insertion, and this showed a benign polyp.  This was not expected due to her thin and uniform endometrium on prior ultrasound.  She saw her PCP for back pain and she had a urine culture negative for infection.  Taking Diclofenac.   She wants to do genetic counseling and testing.   GYNECOLOGIC HISTORY: Patient's last menstrual period was 08/14/2019 (exact date). Contraception: Tubal Menopausal hormone therapy: none Last mammogram:  07-20-19 Bil.Br.MRI/Neg/density C/BiRads1 Last pap smear: 10-15-17 Neg:Neg HR HPV,04-20-14 Neg:Neg HR HPV         OB History    Gravida  3   Para  2   Term  2   Preterm      AB  1   Living  2     SAB  1   TAB      Ectopic      Multiple      Live Births  2              Patient Active Problem List   Diagnosis Date Noted  . Discomfort in chest 10/29/2016  . Chronic fatigue 10/29/2016  . Allergic rhinitis due to allergen 10/29/2016  . Vitamin D deficiency 10/29/2016    Past Medical History:  Diagnosis Date  . Anemia   . Anxiety   .  Endometrial polyp 03/22/2019  . Fibroid   . Heart murmur    pt. thinks she has a benign murmur dx'd during pregnancy  . Mitral regurgitation    a. Prior 2D echo in 2014 showed technically limited study, EF 60%, mild MR, trace TR.  . Right ovarian cyst 07/29/2019    Past Surgical History:  Procedure Laterality Date  . CESAREAN SECTION  2009  . IUD REMOVAL  07/29/2019   expelling IUD  . TUBAL LIGATION  2009    Current Outpatient Medications  Medication Sig Dispense Refill  . acetaminophen (TYLENOL) 500 MG tablet Take 1,000 mg by mouth 2 (two) times daily as needed.    . ALPRAZolam (XANAX) 0.5 MG tablet Take 0.25-0.5 mg by mouth 3 (three) times daily as needed for anxiety.     . Ascorbic Acid (VITAMIN C) 1000 MG tablet Take 1,000 mg by mouth daily.    . cetirizine (ZYRTEC) 10 MG tablet Take 1 tablet by mouth as needed for allergies.     . Cholecalciferol (VITAMIN D-3) 25 MCG (1000 UT) CAPS Take 1 capsule by mouth daily.    Marland Kitchen  diclofenac (VOLTAREN) 75 MG EC tablet Take 75 mg by mouth 2 (two) times daily.    . ferrous sulfate 325 (65 FE) MG tablet Take by mouth daily.    Marland Kitchen FLUoxetine (PROZAC) 20 MG capsule Take 2 capsules by mouth daily.   0  . norethindrone (AYGESTIN) 5 MG tablet Take 1 tablet (5 mg total) by mouth in the morning and at bedtime. 60 tablet 1  . polyethylene glycol (MIRALAX / GLYCOLAX) packet Take 17 g by mouth daily as needed for mild constipation. Takes 1/2 capful daily     . traMADol (ULTRAM) 50 MG tablet Take 1 tablet by mouth as needed.     No current facility-administered medications for this visit.     ALLERGIES: Sulfa antibiotics and Sulfamethoxazole-trimethoprim  Family History  Problem Relation Age of Onset  . Hypertension Mother   . Arrhythmia Mother   . Depression Mother   . Heart disease Mother   . Heart failure Mother   . Diabetes Mother   . Hypertension Father   . Cancer Paternal Grandmother        tumor in spine  . Dementia Paternal Grandfather    . Breast cancer Sister 59    Social History   Socioeconomic History  . Marital status: Married    Spouse name: Not on file  . Number of children: Not on file  . Years of education: Not on file  . Highest education level: Not on file  Occupational History  . Occupation: housewife  Tobacco Use  . Smoking status: Never Smoker  . Smokeless tobacco: Never Used  Vaping Use  . Vaping Use: Never used  Substance and Sexual Activity  . Alcohol use: No    Alcohol/week: 0.0 standard drinks  . Drug use: No  . Sexual activity: Yes    Partners: Male    Birth control/protection: Surgical    Comment: Tubal  Other Topics Concern  . Not on file  Social History Narrative   Lives with husband and children.    Housewife.    Social Determinants of Health   Financial Resource Strain:   . Difficulty of Paying Living Expenses:   Food Insecurity:   . Worried About Charity fundraiser in the Last Year:   . Arboriculturist in the Last Year:   Transportation Needs:   . Film/video editor (Medical):   Marland Kitchen Lack of Transportation (Non-Medical):   Physical Activity:   . Days of Exercise per Week:   . Minutes of Exercise per Session:   Stress:   . Feeling of Stress :   Social Connections:   . Frequency of Communication with Friends and Family:   . Frequency of Social Gatherings with Friends and Family:   . Attends Religious Services:   . Active Member of Clubs or Organizations:   . Attends Archivist Meetings:   Marland Kitchen Marital Status:   Intimate Partner Violence:   . Fear of Current or Ex-Partner:   . Emotionally Abused:   Marland Kitchen Physically Abused:   . Sexually Abused:     Review of Systems  All other systems reviewed and are negative.   PHYSICAL EXAMINATION:    BP 100/70   Pulse 80   Ht 5' 5.5" (1.664 m)   Wt 154 lb 12.8 oz (70.2 kg)   LMP 08/14/2019 (Exact Date)   BMI 25.37 kg/m     General appearance: alert, cooperative and appears stated age   ASSESSMENT  Abnormal  uterine bleeding.  Endometrial polyp.  Adenomyosis. On Aygestin 5 mg po bid.  Right ovarian cyst resolved.   Status post C/S and tubal ligation.  FH breast cancer in sister.  PLAN  Pelvic ultrasound images and report reviewed.   In addition to reviewing ultrasound, the majority of the visit was spent discussing her bleeding and care plan for this.  Continue Aygestin bid.  Patient declines increasing to tid.  Check CBC, iron, ferritin. Genetic counseling and testing.  Options for surgical care reviewed including hysteroscopy, dilation and curettage and endometrial ablation and hysterectomy.  We discussed in detail laparoscopic hysterectomy with bilateral salpingectomy, and possible bilateral oophorectomy.  I reviewed benefits, alternatives, and potential risk of bleeding, infection, damage to surrounding organs, pneumonia, DVT, PE, death, reoperation, vaginal cuff dehiscence, and menopausal symptoms if ovaries are removed.   Patient wishes to proceed with hysterectomy.   ACOG HO on hysterectomy to patient.   45 minute consultation.

## 2019-09-14 NOTE — Telephone Encounter (Signed)
Please start working on precerting surgery for my patient.   She will have a laparoscopic hysterectomy with bilateral salpingectomy, possible bilateral oophorectomy, cystoscopy for abnormal uterine bleeding.   I placed a referral for her to have genetic counseling and testing to complete prior to having her surgery done so I know if her ovaries need to be removed.  Please facilitate this appointment.   She has a family history of a sister with breast cancer in [place].

## 2019-09-14 NOTE — Progress Notes (Signed)
Encounter reviewed by Dr. Titiana Severa Amundson C. Silva.  

## 2019-09-14 NOTE — Telephone Encounter (Signed)
Routing to [employer] for [employer].

## 2019-09-15 LAB — CBC
Hematocrit: 45.8 % (ref 34.0–46.6)
Hemoglobin: 15.1 g/dL (ref 11.1–15.9)
MCH: 31.1 pg (ref 26.6–33.0)
MCHC: 33 g/dL (ref 31.5–35.7)
MCV: 94 fL (ref 79–97)
Platelets: 239 10*3/uL (ref 150–450)
RBC: 4.85 x10E6/uL (ref 3.77–5.28)
RDW: 12.7 % (ref 11.7–15.4)
WBC: 4.6 10*3/uL (ref 3.4–10.8)

## 2019-09-15 LAB — FERRITIN: Ferritin: 132 ng/mL (ref 15–150)

## 2019-09-15 LAB — VITAMIN D 25 HYDROXY (VIT D DEFICIENCY, FRACTURES): Vit D, 25-Hydroxy: 29.9 ng/mL — ABNORMAL LOW (ref 30.0–100.0)

## 2019-09-15 LAB — IRON: Iron: 93 ug/dL (ref 27–159)

## 2019-09-15 NOTE — Telephone Encounter (Signed)
Referral has been routed to [facility] for genetic testing. Joanna Yang is aware of the referral and is working on this.

## 2019-09-15 NOTE — Telephone Encounter (Signed)
Spoke with patient regarding surgery benefits. Patient acknowledges understanding of information presented. Patient is aware that benefits presented are for professional benefits only. Patient is aware that once surgery is scheduled, the hospital will call with separate benefits. Patient is aware of surgery cancellation policy.  Patient stated that she is wanting to have surgery in October due to husband being scheduled for surgery in September. Patient stated that she is being referred for genetic testing prior to surgery. Patient aware of return call from our office after genetic testing to proceed with scheduling.

## 2019-09-19 ENCOUNTER — Telehealth: Payer: Self-pay | Admitting: Genetic Counselor

## 2019-09-19 NOTE — Telephone Encounter (Signed)
Received a genetic counseling referral from Dr. [name] for fhx of breast cancer. Joanna Yang has been cld and scheduled to see Raquel Sarna on 8/5 at 3pm. Pt aware to arrive 15 minutes early.

## 2019-09-19 NOTE — Telephone Encounter (Signed)
Created in error

## 2019-09-27 ENCOUNTER — Other Ambulatory Visit: Payer: Self-pay | Admitting: Obstetrics and Gynecology

## 2019-09-27 NOTE — Telephone Encounter (Signed)
Medication refill request: Aygestin Last OV:  09/14/19 Dr. [name] Next AEX: 01/25/20 Last MMG (if hormonal medication request): 07-20-19 Bil.Br.MRI/Neg/density C/BiRads1 Refill authorized: Today, please advise

## 2019-09-28 ENCOUNTER — Other Ambulatory Visit: Payer: Self-pay

## 2019-09-28 ENCOUNTER — Inpatient Hospital Stay: Payer: Managed Care, Other (non HMO) | Attending: Obstetrics and Gynecology | Admitting: Genetic Counselor

## 2019-09-28 ENCOUNTER — Inpatient Hospital Stay: Payer: Managed Care, Other (non HMO)

## 2019-09-28 DIAGNOSIS — Z803 Family history of malignant neoplasm of breast: Secondary | ICD-10-CM

## 2019-09-29 ENCOUNTER — Encounter: Payer: Self-pay | Admitting: Genetic Counselor

## 2019-09-29 DIAGNOSIS — Z803 Family history of malignant neoplasm of breast: Secondary | ICD-10-CM | POA: Insufficient documentation

## 2019-09-29 NOTE — Progress Notes (Signed)
REFERRING PROVIDER: Nunzio Cobbs, MD [address] [place],  [place] [address]  PRIMARY PROVIDER:  Chesley Noon, MD  PRIMARY REASON FOR VISIT:  1. Family history of breast cancer      HISTORY OF PRESENT ILLNESS:   Joanna Yang, a 49 y.o. female, was seen for a [place] cancer genetics consultation at the request of Dr. Yisroel Ramming due to a family history of cancer.  Joanna Yang presents to clinic today to discuss the possibility of a hereditary predisposition to cancer, genetic testing, and to further clarify her future cancer risks, as well as potential cancer risks for family members.   Joanna Yang does not have a personal history of cancer.      RISK FACTORS:  Menarche was at age 51.  First live birth at age 26.  OCP use for approximately 10-14 years.  Ovaries intact: yes.  Hysterectomy: no.  Menopausal status: premenopausal.  HRT use: 0 years. Colonoscopy: yes; 2020 - normal. Mammogram within the last year: yes. Number of breast biopsies: 0. Up to date with pelvic exams: yes. Any excessive radiation exposure in the past: patient reports many x-rays done due to pneumonia around ages [age]  Past Medical History:  Diagnosis Date  . Anemia   . Anxiety   . Endometrial polyp 03/22/2019  . Family history of breast cancer   . Fibroid   . Heart murmur    pt. thinks she has a benign murmur dx'd during pregnancy  . Mitral regurgitation    a. Prior 2D echo in 2014 showed technically limited study, EF 60%, mild MR, trace TR.  . Right ovarian cyst 07/29/2019    Past Surgical History:  Procedure Laterality Date  . CESAREAN SECTION  2009  . IUD REMOVAL  07/29/2019   expelling IUD  . TUBAL LIGATION  2009    Social History   Socioeconomic History  . Marital status: Married    Spouse name: Not on file  . Number of children: Not on file  . Years of education: Not on file  . Highest education level: Not on file  Occupational History   . Occupation: housewife  Tobacco Use  . Smoking status: Never Smoker  . Smokeless tobacco: Never Used  Vaping Use  . Vaping Use: Never used  Substance and Sexual Activity  . Alcohol use: No    Alcohol/week: 0.0 standard drinks  . Drug use: No  . Sexual activity: Yes    Partners: Male    Birth control/protection: Surgical    Comment: Tubal  Other Topics Concern  . Not on file  Social History Narrative   Lives with husband and children.    Housewife.    Social Determinants of Health   Financial Resource Strain:   . Difficulty of Paying Living Expenses:   Food Insecurity:   . Worried About Charity fundraiser in the Last Year:   . Arboriculturist in the Last Year:   Transportation Needs:   . Film/video editor (Medical):   Marland Kitchen Lack of Transportation (Non-Medical):   Physical Activity:   . Days of Exercise per Week:   . Minutes of Exercise per Session:   Stress:   . Feeling of Stress :   Social Connections:   . Frequency of Communication with Friends and Family:   . Frequency of Social Gatherings with Friends and Family:   . Attends Religious Services:   . Active Member of Clubs  or Organizations:   . Attends Archivist Meetings:   Marland Kitchen Marital Status:      FAMILY HISTORY:  We obtained a detailed, 4-generation family history.  Significant diagnoses are listed below: Family History  Problem Relation Age of Onset  . Hypertension Mother   . Arrhythmia Mother   . Depression Mother   . Heart disease Mother   . Heart failure Mother   . Diabetes Mother   . Hypertension Father   . Cancer Paternal Grandmother        tumor in spine  . Dementia Paternal Grandfather   . Breast cancer Sister 36   Joanna Yang has two sons (ages 35 and 57). Her oldest son has a genetics appointment to be evaluated for Marfan syndrome due to tall stature and pectus excavatum. Joanna Yang has two full-sisters and three half-sisters. One full sister was diagnosed with breast cancer at  the age of 69.   Joanna Yang mother is 66 and has not had cancer. Her mother was adopted, so she has no information about the maternal family history.  Joanna Yang father is 47 and has not had cancer. She has four paternal uncles and one paternal aunt, none of whom have had cancer. One cousin had a problem with his throat that may have been cancer diagnosed in his late 65s. Her paternal grandmother died in her 12s and had a sacrococcygeal chordoma diagnosed in her late 25s. Her grandmother had a brother who had an unknown cancer diagnosed in his 61s or 54s. Her paternal grandfather died in his 18s from pneumonia and did not have cancer.  Joanna Yang is unaware of previous family history of genetic testing for hereditary cancer risks, although most of her sisters are in the process of setting up genetic testing. Patient's maternal ancestors are of [place] descent, and paternal ancestors are of [place] and [place] descent. There is no reported Ashkenazi Jewish ancestry. There is no known consanguinity.  GENETIC COUNSELING ASSESSMENT: Joanna Yang is a 49 y.o. female with a personal history of breast cancer which is somewhat suggestive of a hereditary cancer syndrome and predisposition to cancer. We, therefore, discussed and recommended the following at today's visit.   DISCUSSION: We discussed that approximately 5-10% of breast cancer is hereditary, with most cases associated with the BRCA1 and BRCA2 genes. There are other genes that can be associated with hereditary breast cancer syndromes. These include ATM, CHEK2, PALB2, etc. We discussed that testing is beneficial for several reasons, including knowing about other cancer risks, identifying potential screening and risk-reduction options that may be appropriate, and to understand if other family members could be at risk for cancer and allow them to undergo genetic testing.   We reviewed the characteristics, features and inheritance patterns of  hereditary cancer syndromes. We also discussed genetic testing, including the appropriate family members to test, the process of testing, insurance coverage and turn-around-time for results. We discussed the implications of a negative, positive and/or variant of uncertain significant result. We recommended Joanna Yang pursue genetic testing for the [facility] Multi-Cancer panel.  The Multi-Cancer Panel offered by [facility] includes sequencing and/or deletion duplication testing of the following 85 genes: AIP, ALK, APC, ATM, AXIN2,BAP1,  BARD1, BLM, BMPR1A, BRCA1, BRCA2, BRIP1, CASR, CDC73, CDH1, CDK4, CDKN1B, CDKN1C, CDKN2A (p14ARF), CDKN2A (p16INK4a), CEBPA, CHEK2, CTNNA1, DICER1, DIS3L2, EGFR (c.2369C>T, p.Thr790Met variant only), EPCAM (Deletion/duplication testing only), FH, FLCN, GATA2, GPC3, GREM1 (Promoter region deletion/duplication testing only), HOXB13 (c.251G>A, p.Gly84Glu), HRAS, KIT, MAX, MEN1, MET, MITF (  c.952G>A, p.Glu318Lys variant only), MLH1, MSH2, MSH3, MSH6, MUTYH, NBN, NF1, NF2, NTHL1, PALB2, PDGFRA, PHOX2B, PMS2, POLD1, POLE, POT1, PRKAR1A, PTCH1, PTEN, RAD50, RAD51C, RAD51D, RB1, RECQL4, RET, RNF43, RUNX1, SDHAF2, SDHA (sequence changes only), SDHB, SDHC, SDHD, SMAD4, SMARCA4, SMARCB1, SMARCE1, STK11, SUFU, TERC, TERT, TMEM127, TP53, TSC1, TSC2, VHL, WRN and WT1.   Based on Joanna Yang's family history of young-onset breast cancer and limited family structure, she meets medical criteria for genetic testing. Despite that she meets criteria, she may still have an out of pocket cost. We discussed that if her out of pocket cost for testing is over $100, the laboratory will reach out to let her know. If the out of pocket cost of testing is less than $100 she will be billed by the genetic testing laboratory.   We discussed that some people do not want to undergo genetic testing due to fear of genetic discrimination.  A federal law called the Genetic Information Non-Discrimination Act (GINA) of 2008  helps protect individuals against genetic discrimination based on their genetic test results.  It impacts both health insurance and employment.  With health insurance, it protects against increased premiums, being kicked off insurance or being forced to take a test in order to be insured.  For employment it protects against hiring, firing and promoting decisions based on genetic test results.  Health status due to a cancer diagnosis is not protected under GINA.  Additionally, life, disability, and long-term care insurance is not protected under GINA.   PLAN: After considering the risks, benefits, and limitations, Joanna Yang provided informed consent to pursue genetic testing and the blood sample was sent to [facility] for analysis of the Multi-Cancer panel. Results should be available within approximately two-three weeks' time, at which point they will be disclosed by telephone to Joanna Yang, as will any additional recommendations warranted by these results. Joanna Yang will receive a summary of her genetic counseling visit and a copy of her results once available. This information will also be available in Epic.   Joanna Yang questions were answered to her satisfaction today. Our contact information was provided should additional questions or concerns arise. Thank you for the referral and allowing [place] to share in the care of your patient.   Clint Guy, [place], [employer] Licensed, Certified Dispensing [email or web address] Phone: [phone]  The patient was seen for a total of 40 minutes in face-to-face genetic counseling.  This patient was discussed with Drs. Magrinat, Lindi Adie and/or Burr Medico who agrees with the above.    _______________________________________________________________________ For Office Staff:  Number of people involved in session: 1 Was an Intern/ student involved with case: no

## 2019-10-04 ENCOUNTER — Telehealth: Payer: Self-pay

## 2019-10-04 NOTE — Telephone Encounter (Signed)
Spoke with patient. Patient requesting to discuss surgery scheduling for possibly 11/2019. Genetics counseling completed on 09/28/19. Advised patient I will forward message to [facility], RN to return call to review surgery scheduling avaiability. Patient agreeable.

## 2019-10-04 NOTE — Telephone Encounter (Signed)
Patient is calling in regards to receiving a call to schedule surgery. Patient states she also has questions for the nurse.

## 2019-10-06 NOTE — Telephone Encounter (Signed)
Spoke with patient. Surgery date options reviewed with patient. Patient would like to speak with her spouse and return call.

## 2019-10-07 DIAGNOSIS — Z719 Counseling, unspecified: Secondary | ICD-10-CM | POA: Insufficient documentation

## 2019-10-07 DIAGNOSIS — Z1379 Encounter for other screening for genetic and chromosomal anomalies: Secondary | ICD-10-CM | POA: Insufficient documentation

## 2019-10-09 NOTE — Telephone Encounter (Signed)
Patient returned a call to Kaitlyn. °

## 2019-10-09 NOTE — Telephone Encounter (Signed)
Attempted to reach patient at number provided [phone], there was no answer and voicemail box is full.

## 2019-10-10 NOTE — Telephone Encounter (Addendum)
Spoke with patient. Patient would like to proceed with TLH/BS/possible bilateral oophorectomy with cysto on 12/18/2019. Surgery scheduled for 12/18/2019 at 0730 at [facility]. Pre op scheduled for 11/30/2019 at 11:30 am with Dr.Silva. COVID test scheduled for 12/14/2019 at 11:30 am at [facility] location. Patient is aware of the need to quarantine after test until surgery. 1 week post op scheduled for 12/28/2019 at 11:30 am with Dr.Silva. 6 week post op scheduled for 01/31/2020 at 11:30 am with Dr.Silva. Surgery instructions reviewed and mailed to patient's verified home address on file.   Routing to provider and will close encounter.

## 2019-10-12 ENCOUNTER — Telehealth: Payer: Self-pay | Admitting: Genetic Counselor

## 2019-10-12 ENCOUNTER — Encounter: Payer: Self-pay | Admitting: Genetic Counselor

## 2019-10-12 ENCOUNTER — Ambulatory Visit: Payer: Self-pay | Admitting: Genetic Counselor

## 2019-10-12 DIAGNOSIS — Z1379 Encounter for other screening for genetic and chromosomal anomalies: Secondary | ICD-10-CM

## 2019-10-12 NOTE — Progress Notes (Signed)
HPI:  Joanna Yang was previously seen in the [facility] due to a family history of cancer and concerns regarding a hereditary predisposition to cancer. Please refer to our prior cancer genetics clinic note for more information regarding our discussion, assessment and recommendations, at the time. Joanna Yang recent genetic test results were disclosed to her, as were recommendations warranted by these results. These results and recommendations are discussed in more detail below.  FAMILY HISTORY:  We obtained a detailed, 4-generation family history.  Significant diagnoses are listed below: Family History  Problem Relation Age of Onset  . Hypertension Mother   . Arrhythmia Mother   . Depression Mother   . Heart disease Mother   . Heart failure Mother   . Diabetes Mother   . Hypertension Father   . Cancer Paternal Grandmother        tumor in spine  . Dementia Paternal Grandfather   . Breast cancer Sister 49    Joanna Yang has two sons (ages 35 and 22). Her oldest son has a genetics appointment to be evaluated for Marfan syndrome due to tall stature and pectus excavatum. Joanna Yang has two full-sisters and three half-sisters. One full sister was diagnosed with breast cancer at the age of 46.   Joanna Yang mother is 76 and has not had cancer. Her mother was adopted, so she has no information about the maternal family history.  Joanna Yang father is 35 and has not had cancer. She has four paternal uncles and one paternal aunt, none of whom have had cancer. One cousin had a problem with his throat that may have been cancer diagnosed in his late 54s. Her paternal grandmother died in her 48s and had a sacrococcygeal chordoma diagnosed in her late 72s. Her grandmother had a brother who had an unknown cancer diagnosed in his 3s or 91s. Her paternal grandfather died in his 64s from pneumonia and did not have cancer.  Joanna Yang is unaware of previous family history of  genetic testing for hereditary cancer risks, although most of her sisters are in the process of setting up genetic testing. Patient's maternal ancestors are of [place] descent, and paternal ancestors are of [place] and [place] descent. There is no reported Ashkenazi Jewish ancestry. There is no known consanguinity.  GENETIC TEST RESULTS: Genetic testing reported out on 10/07/2019 through the [facility] Multi-Cancer panel. No pathogenic variants were detected.   The Multi-Cancer Panel offered by [facility] includes sequencing and/or deletion duplication testing of the following 85 genes: AIP, ALK, APC, ATM, AXIN2,BAP1,  BARD1, BLM, BMPR1A, BRCA1, BRCA2, BRIP1, CASR, CDC73, CDH1, CDK4, CDKN1B, CDKN1C, CDKN2A (p14ARF), CDKN2A (p16INK4a), CEBPA, CHEK2, CTNNA1, DICER1, DIS3L2, EGFR (c.2369C>T, p.Thr790Met variant only), EPCAM (Deletion/duplication testing only), FH, FLCN, GATA2, GPC3, GREM1 (Promoter region deletion/duplication testing only), HOXB13 (c.251G>A, p.Gly84Glu), HRAS, KIT, MAX, MEN1, MET, MITF (c.952G>A, p.Glu318Lys variant only), MLH1, MSH2, MSH3, MSH6, MUTYH, NBN, NF1, NF2, NTHL1, PALB2, PDGFRA, PHOX2B, PMS2, POLD1, POLE, POT1, PRKAR1A, PTCH1, PTEN, RAD50, RAD51C, RAD51D, RB1, RECQL4, RET, RNF43, RUNX1, SDHAF2, SDHA (sequence changes only), SDHB, SDHC, SDHD, SMAD4, SMARCA4, SMARCB1, SMARCE1, STK11, SUFU, TERC, TERT, TMEM127, TP53, TSC1, TSC2, VHL, WRN and WT1. The test report will be scanned into EPIC and located under the Molecular Pathology section of the Results Review tab.  A portion of the result report is included below for reference.     We discussed with Joanna Yang that because current genetic testing is not perfect, it is possible there may be a  gene mutation in one of these genes that current testing cannot detect, but that chance is small.  We also discussed that there could be another gene that has not yet been discovered, or that we have not yet tested, that is responsible for the cancer  diagnoses in the family. It is also possible there is a hereditary cause for the cancer in the family that Joanna Yang did not inherit and therefore was not identified in her testing.  Therefore, it is important to remain in touch with cancer genetics in the future so that we can continue to offer Joanna Yang the most up to date genetic testing.   ADDITIONAL GENETIC TESTING:  We discussed with Joanna Yang that her genetic testing was fairly extensive.  If there are genes identified to increase cancer risk that can be analyzed in the future, we would be happy to discuss and coordinate this testing at that time.    CANCER SCREENING RECOMMENDATIONS: Joanna Yang test result is considered negative (normal).  This means that we have not identified a hereditary cause for her family history of cancer at this time. Most cancers happen by chance and this negative test suggests that her family history of cancer may fall into this category.    While reassuring, this does not definitively rule out a hereditary predisposition to cancer. It is still possible that there could be genetic mutations that are undetectable by current technology. There could be genetic mutations in genes that have not been tested or identified to increase cancer risk.  Therefore, it is recommended she continue to follow the cancer management and screening guidelines provided by her primary healthcare providers.   An individual's cancer risk and medical management are not determined by genetic test results alone. Overall cancer risk assessment incorporates additional factors, including personal medical history, family history, and any available genetic information that may result in a personalized plan for cancer prevention and surveillance.  Based on Joanna Yang's personal and family history, as well as her genetic test results, a statistical model Midwife) was used to estimate her risk of developing breast cancer. Tyrer-Cuzick  estimates her lifetime risk of developing breast cancer to be approximately 27.9%.  This lifetime breast cancer risk is a preliminary estimate based on available information using one of several models endorsed by the [place] (ACS). The ACS recommends consideration of breast MRI screening as an adjunct to mammography for patients at high risk (defined as 20% or greater lifetime risk). A more detailed breast cancer risk assessment can be considered, if clinically indicated.   Joanna Yang has been determined to be at high risk for breast cancer.  Therefore, we recommend that she have annual breast MRIs in addition to annual mammograms. We discussed that Joanna Yang should discuss her individual situation with her referring physician and determine a breast cancer screening plan with which they are both comfortable.      RECOMMENDATIONS FOR FAMILY MEMBERS:  Individuals in this family might be at some increased risk of developing cancer, over the general population risk, simply due to the family history of cancer.  We recommended women in this family have a yearly mammogram beginning at age 74, or 39 years younger than the earliest onset of cancer, an annual clinical breast exam, and perform monthly breast self-exams. Women in this family should also have a gynecological exam as recommended by their primary provider. All family members should be referred for colonoscopy starting at age 82.  FOLLOW-UP: Lastly, we  discussed with Joanna Yang that cancer genetics is a rapidly advancing field and it is possible that new genetic tests will be appropriate for her and/or her family members in the future. We encouraged her to remain in contact with cancer genetics on an annual basis so we can update her personal and family histories and let her know of advances in cancer genetics that may benefit this family.   Our contact number was provided. Joanna Yang questions were answered to her satisfaction,  and she knows she is welcome to call us at anytime with additional questions or concerns.   Clint Guy, MS, [facility] Genetic Counselor [email or web address] Phone: [phone]

## 2019-10-12 NOTE — Telephone Encounter (Signed)
Revealed negative genetic testing. Discussed that we do not know why there is cancer in the family. There could be a genetic mutation in the family that Joanna Yang did not inherit. There could also be a mutation in a different gene that we are not testing, or our current technology may not be able detect certain mutations. It will therefore be important for her to stay in contact with genetics to keep up with whether additional testing may be appropriate in the future.

## 2019-11-30 ENCOUNTER — Encounter: Payer: Self-pay | Admitting: Obstetrics and Gynecology

## 2019-11-30 ENCOUNTER — Other Ambulatory Visit: Payer: Self-pay

## 2019-11-30 ENCOUNTER — Ambulatory Visit (INDEPENDENT_AMBULATORY_CARE_PROVIDER_SITE_OTHER): Payer: Managed Care, Other (non HMO) | Admitting: Obstetrics and Gynecology

## 2019-11-30 VITALS — BP 118/76 | HR 80 | Ht 65.5 in | Wt 163.0 lb

## 2019-11-30 DIAGNOSIS — N939 Abnormal uterine and vaginal bleeding, unspecified: Secondary | ICD-10-CM

## 2019-11-30 NOTE — Progress Notes (Signed)
GYNECOLOGY  VISIT   HPI: 49 y.o.   Married  [place]  female   [phone] with No LMP recorded.   here for surgery consult.    Patient has menorrhagia, probable adenomyosis, a small uterine fibroid, and a history of expelling Mirena IUD this summer in July when she presented to the ER.   A prior endometrial biopsy done at the time of her IUD placement did show a benign polyp, which was not expected due to a thin endometrial lining at the time of her prior US.   Her bleeding is currently controlled on Aygestin 5 mg po bid. Mostly having spotting but an occasional increase in bleeding.  She is having cramping which is escalating.  May have forgotten a dosage of the Aygestin.  Feels like she is gaining weight.   Hgb 15.1 on 09/14/19.  She desires hysterectomy, is status post bilateral tubal ligation, and declines future childbearing.  FH breast cancer and recent personal negative genetic testing.  She has occasional rectal bleeding from her hemorrhoids.  She sees Dr. Collene Mares.   She states it takes a while for her to clot when she bleeds.  No problems with excessive bleeding with her Cesarean Section.  No problems with current bleeding with brushing her teeth.   Complete her Covid vaccine.  She will do her flu vaccine in November.   GYNECOLOGIC HISTORY: No LMP recorded. Contraception: Tubal Menopausal hormone therapy: Aygestin 5 mg po bid. Last mammogram: 07-20-19 Bil.Br.MRI/Neg/density C/BiRads1 Last pap smear: 10-15-17 Neg:Neg HR HPV,04-20-14 Neg:Neg HR HPV         OB History    Gravida  3   Para  2   Term  2   Preterm      AB  1   Living  2     SAB  1   TAB      Ectopic      Multiple      Live Births  2              Patient Active Problem List   Diagnosis Date Noted  . Genetic testing 10/07/2019  . Family history of breast cancer   . Discomfort in chest 10/29/2016  . Chronic fatigue 10/29/2016  . Allergic rhinitis due to allergen 10/29/2016  .  Vitamin D deficiency 10/29/2016    Past Medical History:  Diagnosis Date  . Anemia   . Anxiety   . Endometrial polyp 03/22/2019  . Family history of breast cancer   . Fibroid   . Heart murmur    pt. thinks she has a benign murmur dx'd during pregnancy  . Mitral regurgitation    a. Prior 2D echo in 2014 showed technically limited study, EF 60%, mild MR, trace TR.  . Right ovarian cyst 07/29/2019    Past Surgical History:  Procedure Laterality Date  . CESAREAN SECTION  2009  . IUD REMOVAL  07/29/2019   expelling IUD  . TUBAL LIGATION  2009    Current Outpatient Medications  Medication Sig Dispense Refill  . acetaminophen (TYLENOL) 500 MG tablet Take 1,000 mg by mouth 2 (two) times daily as needed.    . Ascorbic Acid (VITAMIN C) 1000 MG tablet Take 1,000 mg by mouth daily.    . cetirizine (ZYRTEC) 10 MG tablet Take 1 tablet by mouth as needed for allergies.     . Cholecalciferol (VITAMIN D-3) 25 MCG (1000 UT) CAPS Take 1 capsule by mouth daily.    . ferrous sulfate  325 (65 FE) MG tablet Take by mouth daily.    Marland Kitchen FLUoxetine (PROZAC) 20 MG capsule Take 2 capsules by mouth daily.   0  . norethindrone (AYGESTIN) 5 MG tablet TAKE 1 TABLET(5 MG) BY MOUTH IN THE MORNING AND AT BEDTIME 180 tablet 0  . polyethylene glycol (MIRALAX / GLYCOLAX) packet Take 17 g by mouth daily as needed for mild constipation. Takes 1/2 capful daily     . ALPRAZolam (XANAX) 0.5 MG tablet Take 0.25-0.5 mg by mouth 3 (three) times daily as needed for anxiety.  (Patient not taking: Reported on 11/30/2019)    . diclofenac (VOLTAREN) 75 MG EC tablet Take 75 mg by mouth 2 (two) times daily. (Patient not taking: Reported on 11/30/2019)     No current facility-administered medications for this visit.     ALLERGIES: Sulfa antibiotics and Sulfamethoxazole-trimethoprim  Family History  Problem Relation Age of Onset  . Hypertension Mother   . Arrhythmia Mother   . Depression Mother   . Heart disease Mother   .  Heart failure Mother   . Diabetes Mother   . Hypertension Father   . Cancer Paternal Grandmother        tumor in spine  . Dementia Paternal Grandfather   . Breast cancer Sister 26    Social History   Socioeconomic History  . Marital status: Married    Spouse name: Not on file  . Number of children: Not on file  . Years of education: Not on file  . Highest education level: Not on file  Occupational History  . Occupation: housewife  Tobacco Use  . Smoking status: Never Smoker  . Smokeless tobacco: Never Used  Vaping Use  . Vaping Use: Never used  Substance and Sexual Activity  . Alcohol use: No    Alcohol/week: 0.0 standard drinks  . Drug use: No  . Sexual activity: Yes    Partners: Male    Birth control/protection: Surgical    Comment: Tubal  Other Topics Concern  . Not on file  Social History Narrative   Lives with husband and children.    Housewife.    Social Determinants of Health   Financial Resource Strain:   . Difficulty of Paying Living Expenses: Not on file  Food Insecurity:   . Worried About Charity fundraiser in the Last Year: Not on file  . Ran Out of Food in the Last Year: Not on file  Transportation Needs:   . Lack of Transportation (Medical): Not on file  . Lack of Transportation (Non-Medical): Not on file  Physical Activity:   . Days of Exercise per Week: Not on file  . Minutes of Exercise per Session: Not on file  Stress:   . Feeling of Stress : Not on file  Social Connections:   . Frequency of Communication with Friends and Family: Not on file  . Frequency of Social Gatherings with Friends and Family: Not on file  . Attends Religious Services: Not on file  . Active Member of Clubs or Organizations: Not on file  . Attends Archivist Meetings: Not on file  . Marital Status: Not on file  Intimate Partner Violence:   . Fear of Current or Ex-Partner: Not on file  . Emotionally Abused: Not on file  . Physically Abused: Not on file   . Sexually Abused: Not on file    Review of Systems  All other systems reviewed and are negative.   PHYSICAL EXAMINATION:  BP 118/76   Pulse 80   Ht 5' 5.5" (1.664 m)   Wt 163 lb (73.9 kg)   BMI 26.71 kg/m     General appearance: alert, cooperative and appears stated age Head: Normocephalic, without obvious abnormality, atraumatic Neck: no adenopathy, supple, symmetrical, trachea midline and thyroid normal to inspection and palpation Lungs: clear to auscultation bilaterally Heart: regular rate and rhythm Abdomen: soft, non-tender, no masses,  no organomegaly Extremities: extremities normal, atraumatic, no cyanosis or edema Skin: Skin color, texture, turgor normal. No rashes or lesions Lymph nodes: Cervical, supraclavicular, and axillary nodes normal. No abnormal inguinal nodes palpated Neurologic: Grossly normal  Pelvic: External genitalia:  no lesions              Urethra:  normal appearing urethra with no masses, tenderness or lesions              Bartholins and Skenes: normal                 Vagina: normal appearing vagina with normal color and discharge, no lesions              Cervix: no lesions                Bimanual Exam:  Uterus:  normal size, contour, position, consistency, mobility, non-tender              Adnexa: no mass, fullness, tenderness         Chaperone was present for exam.  ASSESSMENT  Menorrhagia controlled on Aygestin.  Adenomyosis suspected. Easy bleeding.  Hx C/S and BTL.  FH breast cancer.  Negative personal genetic testing.  Hx mitral regurgitation.   PLAN  CBC now, drawn at the end of the day, prior to knowing of the bleeding history.   Plan will be laparoscopic hysterectomy with bilateral salpingectomy, possible oophorectomy, cystoscopy.  Risks, benefits, and alternatives reviewed with the patient who wishes to proceed.  Possible conversion to laparotomy reviewed.  Outpatient procedure planned.  Surgical expectations and recovery  reviewed. Check PT and INR at preop hospital visit.  Check EKG at preop. Avoid NSAIDs.  Continue Aygestin. Call for any heavy bleeding prior to surgery.

## 2019-12-01 LAB — CBC
Hematocrit: 46.9 % — ABNORMAL HIGH (ref 34.0–46.6)
Hemoglobin: 15.8 g/dL (ref 11.1–15.9)
MCH: 30.7 pg (ref 26.6–33.0)
MCHC: 33.7 g/dL (ref 31.5–35.7)
MCV: 91 fL (ref 79–97)
Platelets: 239 10*3/uL (ref 150–450)
RBC: 5.15 x10E6/uL (ref 3.77–5.28)
RDW: 12.4 % (ref 11.7–15.4)
WBC: 4.6 10*3/uL (ref 3.4–10.8)

## 2019-12-05 ENCOUNTER — Other Ambulatory Visit: Payer: Self-pay

## 2019-12-05 ENCOUNTER — Ambulatory Visit (INDEPENDENT_AMBULATORY_CARE_PROVIDER_SITE_OTHER): Payer: Managed Care, Other (non HMO) | Admitting: Podiatry

## 2019-12-05 DIAGNOSIS — M205X1 Other deformities of toe(s) (acquired), right foot: Secondary | ICD-10-CM | POA: Diagnosis not present

## 2019-12-05 DIAGNOSIS — M79674 Pain in right toe(s): Secondary | ICD-10-CM | POA: Diagnosis not present

## 2019-12-05 DIAGNOSIS — M7752 Other enthesopathy of left foot: Secondary | ICD-10-CM | POA: Diagnosis not present

## 2019-12-05 DIAGNOSIS — M2041 Other hammer toe(s) (acquired), right foot: Secondary | ICD-10-CM | POA: Diagnosis not present

## 2019-12-07 NOTE — Progress Notes (Signed)
Pt. Needs orders for the upcomming surgery,PST and lab. appointment on 12/11/19.Thanks.

## 2019-12-07 NOTE — Patient Instructions (Addendum)
DUE TO COVID-19 ONLY ONE VISITOR IS ALLOWED IN WAITING ROOM (VISITOR WILL HAVE A TEMPERATURE CHECK ON ARRIVAL AND MUST WEAR A FACE MASK THE ENTIRE TIME.)  ONCE YOU ARE ADMITTED TO YOUR PRIVATE ROOM, THE SAME ONE VISITOR IS ALLOWED TO VISIT DURING VISITING HOURS ONLY.  Your COVID swab testing is scheduled for: 12/14/19  At 11:30 am , You must self quarantine after your testing per handout given to you at the testing site. [address] [place], [place] [address]  (Must self quarantine after testing. Follow instructions on handout.)       Your procedure is scheduled on:  Report to [place] AT: 5:30  A. M.   Call this number if you have problems the morning of surgery:[phone].   OUR ADDRESS IS [address].  WE ARE LOCATED IN THE [place]                                   [facility].                                     REMEMBER:   DO NOT EAT FOOD OR DRINK LIQUIDS AFTER MIDNIGHT .    BRUSH YOUR TEETH THE MORNING OF SURGERY.  TAKE THESE MEDICATIONS MORNING OF SURGERY WITH A SIP OF WATER:  Cetirizine,fluoxetine.Xanax as needed.  DO NOT WEAR JEWERLY, MAKE UP, OR NAIL POLISH.  DO NOT WEAR LOTIONS, POWDERS, PERFUMES/COLOGNE OR DEODORANT.  DO NOT SHAVE FOR 24 HOURS PRIOR TO DAY OF SURGERY.  CONTACTS, GLASSES, OR DENTURES MAY NOT BE WORN TO SURGERY.                                    [facility] IS NOT RESPONSIBLE  FOR ANY BELONGINGS.           YOU MAY BRING A SMALL OVERNIGHT [facility] - Preparing for Surgery Before surgery, you can play an important role.  Because skin is not sterile, your skin needs to be as free of germs as possible.  You can reduce the number of germs on your skin by washing with CHG (chlorahexidine gluconate) soap before surgery.  CHG is an antiseptic cleaner which kills germs and bonds with the skin to continue killing germs even after washing. Please DO NOT use if you  have an allergy to CHG or antibacterial soaps.  If your skin becomes reddened/irritated stop using the CHG and inform your nurse when you arrive at Short Stay. Do not shave (including legs and underarms) for at least 48 hours prior to the first CHG shower.  You may shave your face/neck. Please follow these instructions carefully:  1.  Shower with CHG Soap the night before surgery and the  morning of Surgery.  2.  If you choose to wash your hair, wash your hair first as usual with your  normal  shampoo.  3.  After  you shampoo, rinse your hair and body thoroughly to remove the  shampoo.                           4.  Use CHG as you would any other liquid soap.  You can apply chg directly  to the skin and wash                       Gently with a scrungie or clean washcloth.  5.  Apply the CHG Soap to your body ONLY FROM THE NECK DOWN.   Do not use on face/ open                           Wound or open sores. Avoid contact with eyes, ears mouth and genitals (private parts).                       Wash face,  Genitals (private parts) with your normal soap.             6.  Wash thoroughly, paying special attention to the area where your surgery  will be performed.  7.  Thoroughly rinse your body with warm water from the neck down.  8.  DO NOT shower/wash with your normal soap after using and rinsing off  the CHG Soap.                9.  Pat yourself dry with a clean towel.            10.  Wear clean pajamas.            11.  Place clean sheets on your bed the night of your first shower and do not  sleep with pets. Day of Surgery : Do not apply any lotions/deodorants the morning of surgery.  Please wear clean clothes to the hospital/surgery center.  FAILURE TO FOLLOW THESE INSTRUCTIONS MAY RESULT IN THE CANCELLATION OF YOUR SURGERY PATIENT SIGNATURE_________________________________  NURSE  SIGNATURE__________________________________  ________________________________________________________________________

## 2019-12-11 ENCOUNTER — Encounter (HOSPITAL_COMMUNITY)
Admission: RE | Admit: 2019-12-11 | Discharge: 2019-12-11 | Disposition: A | Discharge status: Home / Self Care | Payer: Managed Care, Other (non HMO) | Source: Ambulatory Visit | Attending: Obstetrics and Gynecology | Admitting: Obstetrics and Gynecology

## 2019-12-11 ENCOUNTER — Other Ambulatory Visit: Payer: Self-pay

## 2019-12-11 ENCOUNTER — Encounter (HOSPITAL_COMMUNITY): Payer: Self-pay

## 2019-12-11 DIAGNOSIS — Z01818 Encounter for other preprocedural examination: Secondary | ICD-10-CM | POA: Insufficient documentation

## 2019-12-11 HISTORY — DX: Other specified postprocedural states: Z98.890

## 2019-12-11 HISTORY — DX: Other specified postprocedural states: R11.2

## 2019-12-11 HISTORY — DX: Pneumonia, unspecified organism: J18.9

## 2019-12-11 HISTORY — DX: Other complications of anesthesia, initial encounter: T88.59XA

## 2019-12-11 LAB — BASIC METABOLIC PANEL
Anion gap: 8 (ref 5–15)
BUN: 10 mg/dL (ref 6–20)
CO2: 23 mmol/L (ref 22–32)
Calcium: 9.1 mg/dL (ref 8.9–10.3)
Chloride: 108 mmol/L (ref 98–111)
Creatinine, Ser: 0.78 mg/dL (ref 0.44–1.00)
GFR, Estimated: >60 mL/min (ref >60)
Glucose, Bld: 89 mg/dL (ref 70–99)
Potassium: 4.2 mmol/L (ref 3.5–5.1)
Sodium: 139 mmol/L (ref 135–145)

## 2019-12-11 LAB — CBC
HCT: 45.2 % (ref 36.0–46.0)
Hemoglobin: 15.2 g/dL — ABNORMAL HIGH (ref 12.0–15.0)
MCH: 30.3 pg (ref 26.0–34.0)
MCHC: 33.6 g/dL (ref 30.0–36.0)
MCV: 90 fL (ref 80.0–100.0)
Platelets: 274 10*3/uL (ref 150–400)
RBC: 5.02 MIL/uL (ref 3.87–5.11)
RDW: 12.7 % (ref 11.5–15.5)
WBC: 6.2 10*3/uL (ref 4.0–10.5)
nRBC: 0 % (ref 0.0–0.2)

## 2019-12-11 NOTE — Progress Notes (Signed)
COVID Vaccine Completed: Yes Date COVID Vaccine completed:  COVID vaccine manufacturer: Pfizer     PCP - Dr. Anastasia Pall. LOV: 09/04/19. Cardiologist - NO  Chest x-ray -  EKG -  Stress Test -  ECHO -  2018 EPIC Cardiac Cath -  Pacemaker/ICD device last checked:  Sleep Study - Yes CPAP - NO  Fasting Blood Sugar -  Checks Blood Sugar _____ times a day  Blood Thinner Instructions: Aspirin Instructions: Last Dose:  Anesthesia review: Hx: Heart murmur,mitral valve regurgitation.  Patient denies shortness of breath, fever, cough and chest pain at PAT appointment   Patient verbalized understanding of instructions that were given to them at the PAT appointment. Patient was also instructed that they will need to review over the PAT instructions again at home before surgery.

## 2019-12-12 ENCOUNTER — Other Ambulatory Visit: Payer: Self-pay | Admitting: *Deleted

## 2019-12-12 ENCOUNTER — Other Ambulatory Visit (INDEPENDENT_AMBULATORY_CARE_PROVIDER_SITE_OTHER): Payer: Managed Care, Other (non HMO)

## 2019-12-12 DIAGNOSIS — D699 Hemorrhagic condition, unspecified: Secondary | ICD-10-CM

## 2019-12-13 LAB — PROTIME-INR
INR: 1 (ref 0.9–1.2)
Prothrombin Time: 10 s (ref 9.1–12.0)

## 2019-12-13 LAB — APTT: aPTT: 29 s (ref 24–33)

## 2019-12-14 ENCOUNTER — Other Ambulatory Visit (HOSPITAL_COMMUNITY)
Admission: RE | Admit: 2019-12-14 | Discharge: 2019-12-14 | Disposition: A | Discharge status: Home / Self Care | Payer: Managed Care, Other (non HMO) | Source: Ambulatory Visit | Attending: Obstetrics and Gynecology | Admitting: Obstetrics and Gynecology

## 2019-12-14 DIAGNOSIS — Z01812 Encounter for preprocedural laboratory examination: Secondary | ICD-10-CM | POA: Insufficient documentation

## 2019-12-14 DIAGNOSIS — Z20822 Contact with and (suspected) exposure to covid-19: Secondary | ICD-10-CM | POA: Insufficient documentation

## 2019-12-14 LAB — SARS CORONAVIRUS 2 (TAT 6-24 HRS): SARS Coronavirus 2: NEGATIVE

## 2019-12-16 NOTE — Anesthesia Preprocedure Evaluation (Addendum)
Anesthesia Evaluation  Patient identified by MRN, date of birth, ID band Patient awake    Reviewed: Allergy & Precautions, NPO status , Patient's Chart, lab work & pertinent test results  History of Anesthesia Complications (+) PONV and history of anesthetic complications  Airway Mallampati: II  TM Distance: >3 FB Neck ROM: Full    Dental  (+) Dental Advisory Given, Teeth Intact   Pulmonary   Possible OSA, not yet formally tested    Pulmonary exam normal        Cardiovascular negative cardio ROS Normal cardiovascular exam   '18 Stress Echo - Electrically abnormal treadmill stress electrocardiogram suggesetive of ischemia, however, normal stress echocardiographic images. Fair exercise tolerance with normal functional capacity. No chest pain was reported. Findings are equivocal. Additional perfusion testing may be warranted.     Neuro/Psych PSYCHIATRIC DISORDERS Anxiety negative neurological ROS     GI/Hepatic negative GI ROS, Neg liver ROS,   Endo/Other  negative endocrine ROS  Renal/GU negative Renal ROS     Musculoskeletal negative musculoskeletal ROS (+)   Abdominal   Peds  Hematology negative hematology ROS (+)   Anesthesia Other Findings Covid test negative   Reproductive/Obstetrics  Menorrhagia S/p tubal ligation                             Anesthesia Physical Anesthesia Plan  ASA: II  Anesthesia Plan: General   Post-op Pain Management:    Induction: Intravenous  PONV Risk Score and Plan: 4 or greater and Treatment may vary due to age or medical condition, Ondansetron, Dexamethasone, Midazolam and Scopolamine patch - Pre-op  Airway Management Planned: Oral ETT  Additional Equipment: None  Intra-op Plan:   Post-operative Plan: Extubation in OR  Informed Consent: I have reviewed the patients History and Physical, chart, labs and discussed the procedure including  the risks, benefits and alternatives for the proposed anesthesia with the patient or authorized representative who has indicated his/her understanding and acceptance.     Dental advisory given  Plan Discussed with: CRNA and Anesthesiologist  Anesthesia Plan Comments:        Anesthesia Quick Evaluation

## 2019-12-18 ENCOUNTER — Ambulatory Visit (HOSPITAL_BASED_OUTPATIENT_CLINIC_OR_DEPARTMENT_OTHER)
Admission: RE | Admit: 2019-12-18 | Discharge: 2019-12-19 | Disposition: A | Discharge status: Home / Self Care | Payer: Managed Care, Other (non HMO) | Source: Home / Self Care | Attending: Obstetrics and Gynecology | Admitting: Obstetrics and Gynecology

## 2019-12-18 ENCOUNTER — Other Ambulatory Visit: Payer: Self-pay

## 2019-12-18 ENCOUNTER — Encounter (HOSPITAL_BASED_OUTPATIENT_CLINIC_OR_DEPARTMENT_OTHER)
Admission: RE | Disposition: A | Discharge status: Home / Self Care | Payer: Self-pay | Source: Home / Self Care | Attending: Obstetrics and Gynecology

## 2019-12-18 ENCOUNTER — Encounter (HOSPITAL_BASED_OUTPATIENT_CLINIC_OR_DEPARTMENT_OTHER): Payer: Self-pay | Admitting: Obstetrics and Gynecology

## 2019-12-18 ENCOUNTER — Observation Stay (HOSPITAL_BASED_OUTPATIENT_CLINIC_OR_DEPARTMENT_OTHER): Payer: Managed Care, Other (non HMO) | Admitting: Anesthesiology

## 2019-12-18 ENCOUNTER — Observation Stay (HOSPITAL_BASED_OUTPATIENT_CLINIC_OR_DEPARTMENT_OTHER): Payer: Managed Care, Other (non HMO) | Admitting: Physician Assistant

## 2019-12-18 DIAGNOSIS — D259 Leiomyoma of uterus, unspecified: Secondary | ICD-10-CM

## 2019-12-18 DIAGNOSIS — N921 Excessive and frequent menstruation with irregular cycle: Secondary | ICD-10-CM

## 2019-12-18 DIAGNOSIS — R5382 Chronic fatigue, unspecified: Secondary | ICD-10-CM | POA: Insufficient documentation

## 2019-12-18 DIAGNOSIS — D251 Intramural leiomyoma of uterus: Secondary | ICD-10-CM | POA: Insufficient documentation

## 2019-12-18 DIAGNOSIS — Z818 Family history of other mental and behavioral disorders: Secondary | ICD-10-CM | POA: Insufficient documentation

## 2019-12-18 DIAGNOSIS — Z8249 Family history of ischemic heart disease and other diseases of the circulatory system: Secondary | ICD-10-CM | POA: Insufficient documentation

## 2019-12-18 DIAGNOSIS — Z90710 Acquired absence of both cervix and uterus: Secondary | ICD-10-CM | POA: Diagnosis present

## 2019-12-18 DIAGNOSIS — Z79899 Other long term (current) drug therapy: Secondary | ICD-10-CM | POA: Insufficient documentation

## 2019-12-18 DIAGNOSIS — N920 Excessive and frequent menstruation with regular cycle: Secondary | ICD-10-CM | POA: Insufficient documentation

## 2019-12-18 DIAGNOSIS — K5939 Other megacolon: Secondary | ICD-10-CM | POA: Diagnosis not present

## 2019-12-18 DIAGNOSIS — Z808 Family history of malignant neoplasm of other organs or systems: Secondary | ICD-10-CM | POA: Insufficient documentation

## 2019-12-18 DIAGNOSIS — Z803 Family history of malignant neoplasm of breast: Secondary | ICD-10-CM | POA: Insufficient documentation

## 2019-12-18 DIAGNOSIS — E559 Vitamin D deficiency, unspecified: Secondary | ICD-10-CM | POA: Insufficient documentation

## 2019-12-18 DIAGNOSIS — N736 Female pelvic peritoneal adhesions (postinfective): Secondary | ICD-10-CM | POA: Insufficient documentation

## 2019-12-18 DIAGNOSIS — I340 Nonrheumatic mitral (valve) insufficiency: Secondary | ICD-10-CM | POA: Insufficient documentation

## 2019-12-18 DIAGNOSIS — F419 Anxiety disorder, unspecified: Secondary | ICD-10-CM | POA: Insufficient documentation

## 2019-12-18 DIAGNOSIS — N840 Polyp of corpus uteri: Secondary | ICD-10-CM | POA: Insufficient documentation

## 2019-12-18 DIAGNOSIS — Z9889 Other specified postprocedural states: Secondary | ICD-10-CM

## 2019-12-18 DIAGNOSIS — Z833 Family history of diabetes mellitus: Secondary | ICD-10-CM | POA: Insufficient documentation

## 2019-12-18 DIAGNOSIS — K460 Unspecified abdominal hernia with obstruction, without gangrene: Secondary | ICD-10-CM | POA: Diagnosis not present

## 2019-12-18 HISTORY — PX: TOTAL LAPAROSCOPIC HYSTERECTOMY WITH SALPINGECTOMY: SHX6742

## 2019-12-18 HISTORY — PX: CYSTOSCOPY: SHX5120

## 2019-12-18 LAB — CBC
HCT: 42.1 % (ref 36.0–46.0)
Hemoglobin: 14.1 g/dL (ref 12.0–15.0)
MCH: 30.7 pg (ref 26.0–34.0)
MCHC: 33.5 g/dL (ref 30.0–36.0)
MCV: 91.5 fL (ref 80.0–100.0)
Platelets: 197 10*3/uL (ref 150–400)
RBC: 4.6 MIL/uL (ref 3.87–5.11)
RDW: 13 % (ref 11.5–15.5)
WBC: 9.4 10*3/uL (ref 4.0–10.5)
nRBC: 0 % (ref 0.0–0.2)

## 2019-12-18 LAB — TYPE AND SCREEN
ABO/RH(D): A POS
Antibody Screen: NEGATIVE

## 2019-12-18 LAB — POCT PREGNANCY, URINE: Preg Test, Ur: NEGATIVE

## 2019-12-18 LAB — ABO/RH: ABO/RH(D): A POS

## 2019-12-18 SURGERY — HYSTERECTOMY, TOTAL, LAPAROSCOPIC, WITH SALPINGECTOMY
Anesthesia: General | Site: Uterus

## 2019-12-18 MED ORDER — FENTANYL CITRATE (PF) 250 MCG/5ML IJ SOLN
INTRAMUSCULAR | Status: AC
  Filled 2019-12-18: qty 5

## 2019-12-18 MED ORDER — SODIUM CHLORIDE 0.9 % IV SOLN
2.0000 g | INTRAVENOUS | Status: AC
  Administered 2019-12-18: 2 g via INTRAVENOUS

## 2019-12-18 MED ORDER — ONDANSETRON HCL 4 MG/2ML IJ SOLN
INTRAMUSCULAR | Status: DC | PRN
  Administered 2019-12-18: 4 mg via INTRAVENOUS

## 2019-12-18 MED ORDER — OXYCODONE-ACETAMINOPHEN 5-325 MG PO TABS
ORAL_TABLET | ORAL | Status: AC
  Filled 2019-12-18: qty 1

## 2019-12-18 MED ORDER — LIDOCAINE HCL (CARDIAC) PF 100 MG/5ML IV SOSY
PREFILLED_SYRINGE | INTRAVENOUS | Status: DC | PRN
  Administered 2019-12-18: 100 mg via INTRAVENOUS

## 2019-12-18 MED ORDER — PROMETHAZINE HCL 25 MG/ML IJ SOLN: 6.2500 mg | INTRAMUSCULAR | Status: DC | PRN

## 2019-12-18 MED ORDER — PHENYLEPHRINE 40 MCG/ML (10ML) SYRINGE FOR IV PUSH (FOR BLOOD PRESSURE SUPPORT)
PREFILLED_SYRINGE | INTRAVENOUS | Status: AC
  Filled 2019-12-18: qty 10

## 2019-12-18 MED ORDER — MIDAZOLAM HCL 5 MG/5ML IJ SOLN
INTRAMUSCULAR | Status: DC | PRN
  Administered 2019-12-18: 2 mg via INTRAVENOUS

## 2019-12-18 MED ORDER — SIMETHICONE 80 MG PO CHEW
80.0000 mg | CHEWABLE_TABLET | Freq: Four times a day (QID) | ORAL | Status: DC | PRN
  Administered 2019-12-18 – 2019-12-19 (×2): 80 mg via ORAL

## 2019-12-18 MED ORDER — PROPOFOL 10 MG/ML IV BOLUS
INTRAVENOUS | Status: AC
  Filled 2019-12-18: qty 40

## 2019-12-18 MED ORDER — ORAL CARE MOUTH RINSE: 15.0000 mL | Freq: Once | OROMUCOSAL | Status: DC

## 2019-12-18 MED ORDER — POVIDONE-IODINE 10 % EX SWAB
2.0000 "application " | Freq: Once | CUTANEOUS | Status: AC
  Administered 2019-12-18: 2 via TOPICAL

## 2019-12-18 MED ORDER — DEXMEDETOMIDINE HCL IN NACL 200 MCG/50ML IV SOLN
INTRAVENOUS | Status: DC | PRN
  Administered 2019-12-18 (×2): 4 ug via INTRAVENOUS

## 2019-12-18 MED ORDER — ONDANSETRON HCL 4 MG PO TABS: 4.0000 mg | ORAL_TABLET | Freq: Four times a day (QID) | ORAL | Status: DC | PRN

## 2019-12-18 MED ORDER — KETOROLAC TROMETHAMINE 30 MG/ML IJ SOLN
30.0000 mg | Freq: Four times a day (QID) | INTRAMUSCULAR | Status: DC
  Administered 2019-12-18 – 2019-12-19 (×4): 30 mg via INTRAVENOUS

## 2019-12-18 MED ORDER — OXYCODONE HCL 5 MG PO TABS: 5.0000 mg | ORAL_TABLET | Freq: Once | ORAL | Status: DC | PRN

## 2019-12-18 MED ORDER — ROCURONIUM BROMIDE 10 MG/ML (PF) SYRINGE
PREFILLED_SYRINGE | INTRAVENOUS | Status: AC
  Filled 2019-12-18: qty 10

## 2019-12-18 MED ORDER — FENTANYL CITRATE (PF) 100 MCG/2ML IJ SOLN
INTRAMUSCULAR | Status: DC | PRN
  Administered 2019-12-18 (×7): 50 ug via INTRAVENOUS

## 2019-12-18 MED ORDER — SODIUM CHLORIDE 0.9 % IV SOLN
INTRAVENOUS | Status: DC | PRN
  Administered 2019-12-18: 60 mL

## 2019-12-18 MED ORDER — DEXAMETHASONE SODIUM PHOSPHATE 10 MG/ML IJ SOLN
INTRAMUSCULAR | Status: AC
  Filled 2019-12-18: qty 1

## 2019-12-18 MED ORDER — MIDAZOLAM HCL 2 MG/2ML IJ SOLN
INTRAMUSCULAR | Status: AC
  Filled 2019-12-18: qty 2

## 2019-12-18 MED ORDER — MORPHINE SULFATE (PF) 4 MG/ML IV SOLN: 1.0000 mg | INTRAVENOUS | Status: DC | PRN

## 2019-12-18 MED ORDER — 0.9 % SODIUM CHLORIDE (POUR BTL) OPTIME
TOPICAL | Status: DC | PRN
  Administered 2019-12-18: 500 mL

## 2019-12-18 MED ORDER — LACTATED RINGERS IV SOLN: INTRAVENOUS | Status: DC

## 2019-12-18 MED ORDER — BUPIVACAINE HCL (PF) 0.25 % IJ SOLN
INTRAMUSCULAR | Status: DC | PRN
  Administered 2019-12-18: 5 mL

## 2019-12-18 MED ORDER — ONDANSETRON HCL 4 MG/2ML IJ SOLN: 4.0000 mg | Freq: Four times a day (QID) | INTRAMUSCULAR | Status: DC | PRN

## 2019-12-18 MED ORDER — ONDANSETRON HCL 4 MG/2ML IJ SOLN
INTRAMUSCULAR | Status: AC
  Filled 2019-12-18: qty 2

## 2019-12-18 MED ORDER — SODIUM CHLORIDE 0.9 % IR SOLN
Status: DC | PRN
  Administered 2019-12-18: 3000 mL

## 2019-12-18 MED ORDER — LACTATED RINGERS IV SOLN
INTRAVENOUS | Status: DC
  Administered 2019-12-18: 125 mL/h via INTRAVENOUS

## 2019-12-18 MED ORDER — SODIUM CHLORIDE 0.9 % IV SOLN
INTRAVENOUS | Status: AC
  Filled 2019-12-18: qty 2

## 2019-12-18 MED ORDER — FENTANYL CITRATE (PF) 100 MCG/2ML IJ SOLN
INTRAMUSCULAR | Status: AC
  Filled 2019-12-18: qty 2

## 2019-12-18 MED ORDER — DEXAMETHASONE SODIUM PHOSPHATE 4 MG/ML IJ SOLN
INTRAMUSCULAR | Status: DC | PRN
  Administered 2019-12-18: 10 mg via INTRAVENOUS

## 2019-12-18 MED ORDER — LIDOCAINE 2% (20 MG/ML) 5 ML SYRINGE
INTRAMUSCULAR | Status: AC
  Filled 2019-12-18: qty 5

## 2019-12-18 MED ORDER — SCOPOLAMINE 1 MG/3DAYS TD PT72
MEDICATED_PATCH | TRANSDERMAL | Status: AC
  Filled 2019-12-18: qty 1

## 2019-12-18 MED ORDER — ENOXAPARIN SODIUM 40 MG/0.4ML ~~LOC~~ SOLN
40.0000 mg | SUBCUTANEOUS | Status: AC
  Administered 2019-12-18: 40 mg via SUBCUTANEOUS

## 2019-12-18 MED ORDER — DEXMEDETOMIDINE (PRECEDEX) IN NS 20 MCG/5ML (4 MCG/ML) IV SYRINGE
PREFILLED_SYRINGE | INTRAVENOUS | Status: AC
  Filled 2019-12-18: qty 5

## 2019-12-18 MED ORDER — MENTHOL 3 MG MT LOZG: 1.0000 | LOZENGE | OROMUCOSAL | Status: DC | PRN

## 2019-12-18 MED ORDER — SIMETHICONE 80 MG PO CHEW
CHEWABLE_TABLET | ORAL | Status: AC
  Filled 2019-12-18: qty 1

## 2019-12-18 MED ORDER — SCOPOLAMINE 1 MG/3DAYS TD PT72
MEDICATED_PATCH | TRANSDERMAL | Status: DC | PRN
  Administered 2019-12-18: 1 via TRANSDERMAL

## 2019-12-18 MED ORDER — OXYCODONE HCL 5 MG/5ML PO SOLN: 5.0000 mg | Freq: Once | ORAL | Status: DC | PRN

## 2019-12-18 MED ORDER — KETOROLAC TROMETHAMINE 30 MG/ML IJ SOLN
INTRAMUSCULAR | Status: AC
  Filled 2019-12-18: qty 2

## 2019-12-18 MED ORDER — OXYCODONE-ACETAMINOPHEN 5-325 MG PO TABS
1.0000 | ORAL_TABLET | ORAL | Status: DC | PRN
  Administered 2019-12-18 – 2019-12-19 (×4): 1 via ORAL

## 2019-12-18 MED ORDER — SODIUM CHLORIDE 0.9 % IR SOLN
Status: DC | PRN
  Administered 2019-12-18: 1000 mL via INTRAVESICAL

## 2019-12-18 MED ORDER — KETOROLAC TROMETHAMINE 30 MG/ML IJ SOLN
INTRAMUSCULAR | Status: AC
  Filled 2019-12-18: qty 1

## 2019-12-18 MED ORDER — PROPOFOL 10 MG/ML IV BOLUS
INTRAVENOUS | Status: DC | PRN
  Administered 2019-12-18: 40 mg via INTRAVENOUS
  Administered 2019-12-18: 160 mg via INTRAVENOUS

## 2019-12-18 MED ORDER — FLUOXETINE HCL 20 MG PO CAPS
40.0000 mg | ORAL_CAPSULE | Freq: Every day | ORAL | Status: DC
  Filled 2019-12-18: qty 2

## 2019-12-18 MED ORDER — PHENYLEPHRINE 40 MCG/ML (10ML) SYRINGE FOR IV PUSH (FOR BLOOD PRESSURE SUPPORT)
PREFILLED_SYRINGE | INTRAVENOUS | Status: DC | PRN
  Administered 2019-12-18: 120 ug via INTRAVENOUS
  Administered 2019-12-18: 40 ug via INTRAVENOUS

## 2019-12-18 MED ORDER — KETOROLAC TROMETHAMINE 30 MG/ML IJ SOLN
INTRAMUSCULAR | Status: DC | PRN
  Administered 2019-12-18: 30 mg via INTRAVENOUS

## 2019-12-18 MED ORDER — FENTANYL CITRATE (PF) 100 MCG/2ML IJ SOLN: 25.0000 ug | INTRAMUSCULAR | Status: DC | PRN

## 2019-12-18 MED ORDER — ENOXAPARIN SODIUM 40 MG/0.4ML ~~LOC~~ SOLN
SUBCUTANEOUS | Status: AC
  Filled 2019-12-18: qty 0.4

## 2019-12-18 MED ORDER — SUGAMMADEX SODIUM 200 MG/2ML IV SOLN
INTRAVENOUS | Status: DC | PRN
  Administered 2019-12-18: 200 mg via INTRAVENOUS

## 2019-12-18 MED ORDER — LORATADINE 10 MG PO TABS: 10.0000 mg | ORAL_TABLET | Freq: Every day | ORAL | Status: DC

## 2019-12-18 MED ORDER — IBUPROFEN 800 MG PO TABS: 800.0000 mg | ORAL_TABLET | Freq: Three times a day (TID) | ORAL | Status: DC | PRN

## 2019-12-18 MED ORDER — CHLORHEXIDINE GLUCONATE 0.12 % MT SOLN: 15.0000 mL | Freq: Once | OROMUCOSAL | Status: DC

## 2019-12-18 MED ORDER — ROCURONIUM BROMIDE 100 MG/10ML IV SOLN
INTRAVENOUS | Status: DC | PRN
  Administered 2019-12-18: 10 mg via INTRAVENOUS
  Administered 2019-12-18: 60 mg via INTRAVENOUS
  Administered 2019-12-18: 10 mg via INTRAVENOUS

## 2019-12-18 SURGICAL SUPPLY — 68 items
ADH SKN CLS APL DERMABOND .7 (GAUZE/BANDAGES/DRESSINGS) ×2
APL SRG 38 LTWT LNG FL B (MISCELLANEOUS) ×2
APPLICATOR ARISTA FLEXITIP XL (MISCELLANEOUS) ×3 IMPLANT
BARRIER ADHS 3X4 INTERCEED (GAUZE/BANDAGES/DRESSINGS) IMPLANT
BRR ADH 4X3 ABS CNTRL BYND (GAUZE/BANDAGES/DRESSINGS)
CABLE HIGH FREQUENCY MONO STRZ (ELECTRODE) IMPLANT
CANISTER SUCT 3000ML PPV (MISCELLANEOUS) ×3 IMPLANT
CELL SAVER LIPIGURD (MISCELLANEOUS) IMPLANT
COVER BACK TABLE 60X90IN (DRAPES) IMPLANT
COVER MAYO STAND STRL (DRAPES) ×3 IMPLANT
COVER WAND RF STERILE (DRAPES) ×3 IMPLANT
DECANTER SPIKE VIAL GLASS SM (MISCELLANEOUS) ×6 IMPLANT
DERMABOND ADVANCED (GAUZE/BANDAGES/DRESSINGS) ×1
DERMABOND ADVANCED .7 DNX12 (GAUZE/BANDAGES/DRESSINGS) ×2 IMPLANT
DURAPREP 26ML APPLICATOR (WOUND CARE) ×3 IMPLANT
EXTRT SYSTEM ALEXIS 14CM (MISCELLANEOUS)
EXTRT SYSTEM ALEXIS 17CM (MISCELLANEOUS)
GAUZE 4X4 16PLY RFD (DISPOSABLE) ×3 IMPLANT
GLOVE BIO SURGEON STRL SZ 6.5 (GLOVE) ×6 IMPLANT
GLOVE BIO SURGEON STRL SZ7 (GLOVE) ×3 IMPLANT
GLOVE BIOGEL PI IND STRL 7.0 (GLOVE) ×2 IMPLANT
GLOVE BIOGEL PI INDICATOR 7.0 (GLOVE) ×1
GOWN STRL REUS W/TWL LRG LVL3 (GOWN DISPOSABLE) ×15 IMPLANT
HEMOSTAT ARISTA ABSORB 3G PWDR (HEMOSTASIS) ×3 IMPLANT
HOLDER FOLEY CATH W/STRAP (MISCELLANEOUS) IMPLANT
IRRIG SUCT STRYKERFLOW 2 WTIP (MISCELLANEOUS) ×3
IRRIGATION SUCT STRKRFLW 2 WTP (MISCELLANEOUS) ×2 IMPLANT
LIGASURE VESSEL 5MM BLUNT TIP (ELECTROSURGICAL) ×6 IMPLANT
NEEDLE INSUFFLATION 120MM (ENDOMECHANICALS) ×3 IMPLANT
OCCLUDER COLPOPNEUMO (BALLOONS) ×3 IMPLANT
PACK LAPAROSCOPY BASIN (CUSTOM PROCEDURE TRAY) ×3 IMPLANT
PACK TRENDGUARD 450 HYBRID PRO (MISCELLANEOUS) ×2 IMPLANT
POUCH LAPAROSCOPIC INSTRUMENT (MISCELLANEOUS) ×3 IMPLANT
PROTECTOR NERVE ULNAR (MISCELLANEOUS) ×6 IMPLANT
RETRACTOR WOUND ALXS 19CM XSML (INSTRUMENTS) IMPLANT
RTRCTR WOUND ALEXIS 19CM XSML (INSTRUMENTS)
SCISSORS LAP 5X35 DISP (ENDOMECHANICALS) IMPLANT
SET IRRIG Y TYPE TUR BLADDER L (SET/KITS/TRAYS/PACK) ×3 IMPLANT
SET SUCTION IRRIG HYDROSURG (IRRIGATION / IRRIGATOR) ×3 IMPLANT
SET TRI-LUMEN FLTR TB AIRSEAL (TUBING) ×3 IMPLANT
SHEARS HARMONIC ACE PLUS 36CM (ENDOMECHANICALS) ×3 IMPLANT
SLEEVE ADV FIXATION 5X100MM (TROCAR) ×3 IMPLANT
SLEEVE SURGEON STRL (DRAPES) ×3 IMPLANT
SUT VIC AB 0 CT1 27 (SUTURE) ×6
SUT VIC AB 0 CT1 27XBRD ANBCTR (SUTURE) ×4 IMPLANT
SUT VIC AB 4-0 PS2 18 (SUTURE) ×3 IMPLANT
SUT VICRYL 0 UR6 27IN ABS (SUTURE) IMPLANT
SUT VLOC 180 0 9IN  GS21 (SUTURE) ×1
SUT VLOC 180 0 9IN GS21 (SUTURE) ×2 IMPLANT
SYR 10ML LL (SYRINGE) ×3 IMPLANT
SYR 50ML LL SCALE MARK (SYRINGE) ×6 IMPLANT
SYR CONTROL 10ML LL (SYRINGE) ×3 IMPLANT
SYSTEM CARTER THOMASON II (TROCAR) IMPLANT
SYSTEM CONTND EXTRCTN KII BLLN (MISCELLANEOUS) IMPLANT
TIP RUMI ORANGE 6.7MMX12CM (TIP) IMPLANT
TIP UTERINE 5.1X6CM LAV DISP (MISCELLANEOUS) IMPLANT
TIP UTERINE 6.7X10CM GRN DISP (MISCELLANEOUS) IMPLANT
TIP UTERINE 6.7X6CM WHT DISP (MISCELLANEOUS) IMPLANT
TIP UTERINE 6.7X8CM BLUE DISP (MISCELLANEOUS) ×3 IMPLANT
TOWEL OR 17X26 10 PK STRL BLUE (TOWEL DISPOSABLE) ×6 IMPLANT
TRAY FOLEY W/BAG SLVR 14FR LF (SET/KITS/TRAYS/PACK) ×3 IMPLANT
TRENDGUARD 450 HYBRID PRO PACK (MISCELLANEOUS) ×3
TROCAR ADV FIXATION 5X100MM (TROCAR) ×3 IMPLANT
TROCAR BLADELESS OPT 5 100 (ENDOMECHANICALS) ×3 IMPLANT
TROCAR PORT AIRSEAL 5X120 (TROCAR) ×3 IMPLANT
TROCAR PORT AIRSEAL 8X120 (TROCAR) IMPLANT
TROCAR XCEL NON BLADE 8MM B8LT (ENDOMECHANICALS) ×3 IMPLANT
WARMER LAPAROSCOPE (MISCELLANEOUS) ×3 IMPLANT

## 2019-12-18 NOTE — Op Note (Addendum)
OPERATIVE REPORT  PREOPERATIVE DIAGNOSIS:   Menorrhagia, fibroid, endometrial polyp  POSTOPERATIVE DIAGNOSIS:   Menorrhagia, fibroid, endometrial polyp, possible bladder cyst.  PROCEDURES:  Total laparoscopic hysterectomy with bilateral salpingectomy, lysis of adhesion, cystoscopy  SURGEON:  Lenard Galloway, M.D.  ASSISTANT:   Dorothy Spark, M.D.  ANESTHESIA:  General endotracheal, intraperitoneal ropivicaine 30 mL diluted in 30 mL of normal saline, local with 0.25% Marcaine.  IVF:   1500 cc LR  ESTIMATED BLOOD LOSS:   75 cc  URINE OUTPUT:   656 cc  COMPLICATIONS:  None.  INDICATIONS FOR THE PROCEDURE:     The patient is a 49 year old Caucasian female, status post bilateral tubal ligation who presents with menorrhagia, a small fibroid and suspected adenomyosis on pelvic ultrasound, and endometrial polyp.  She expelled a Mirena IUD in July, 2021 and requests hysterectomy to control her uterine bleeding.  She declines future childbearing.  A plan is made to proceed with a total laparoscopic hysterectomy with bilateral salpingectomy, possible bilateral oophorectomy, and cystoscopy after risks, benefits, and alternatives are reviewed.  FINDINGS:     Laparoscopy revealed a normal uterus and bilateral ovaries.  Her fallopian tubes were consistent with prior tubal ligation.  The upper abdomen demonstrated a normal liver.  There was minor adhesive adhesive disease of omentum to the anterior abdominal wall under the umbilicus, and this was completely excised.  There was an area of adhesions immediately adjacent to the right sidewall.  No band of adhesions were noted.  No endometriosis was seen in the abdomen or pelvis.  The appendix was not visualized.  The upper abdomen at the end of the surgery demonstrated dilated bowel which appeared to be filled with gas.  No abnormal coloration of the serosa was noted.   Cystoscopy at the termination of the procedure evaluated the bladder 360 degrees  including the bladder dome and trigone. There was a small area which appeared to be a cyst of the bladder dome.  There was no evidence of any foreign body in the bladder or the urethra. There was no evidence of any lesions of the urethra.   Both of the ureters were noted to be patent bilaterally.  SPECIMENS:     The uterus, cervix, and bilateral tubes were went to pathology.   DESCRIPTION OF PROCEDURE:    The patient was reidentified in the preoperative hold area.   She did receive Cefotetan IV for antibiotic prophylaxis.  She received Lovenox, TED hose and PAS stockings for DVT prophylaxis.  In the operating room, the patient was placed in the dorsal lithotomy position on the operating room table.  The Trendguard was used to support her on the OR table.  Her legs were placed in the [place] stirrups and her arms were both padded and tucked at her sides. The patient received general endotracheal anesthesia. The abdomen and vagina were then sterilely prepped and she was sterilely draped.  A speculum was placed in the vagina and a single-tooth tenaculum was placed on the anterior cervical lip.  A figure-of-eight suture of 0 Vicryl was placed on each the anterior and the posterior cervical lips. The uterus was sounded to 8 cm.  The cervix was then dilated with Hegar dilators.  A #8 RUMI tip with a small metal KOH ring was then placed through the cervix and into the uterine cavity without difficulty.  The remaining vaginal instruments were then removed.  A Foley catheter was placed inside the bladder.  Attention was turned  to the abdomen where the umbilical region was injected with 0.25% Marcaine and a small incision created.  A Veress needle was then used to insufflate the abdomen with CO2 gas after a saline drop test was performed and the fluid flowed freely.  A 5 mm umbilical incision was created with a scalpel after the skin. A 5 mm camera port was then placed using the Optiview.  Incisions were then  created in the left upper abdomen and the left lower abdomen after the skin was injected locally with 0.25% Marcaine.  An 8 mm trocars was placed in the left upper incision and a 5 mm incision was placed in the left lower incision under visualization of the laparoscope.  An 8 mm trocar was placed in the right lower quadrant after injecting with Marcaine and incising with a scalpel.     Ropivicine was placed inside the peritoneal cavity.   The patient was placed in Trendelenburg position.  An inspection of the abdomen and pelvis was performed. The findings are as noted above.   The bilateral ureters were identified.  The left fallopian tube was grasped and the Ligasure was used to cauterize and cut through the mesosalpinx and then the proximal tube.  The specimen was sent to pathology.  The left utero-ovarian ligament was similarly cauterized and cut with the Ligasure.  The left round ligament was then cauterized and divided with same instrument.  Dissection was performed to the anterior and posterior leaves of the broad ligaments using the Harmonic scalpel.  The peritoneum was taken down posteriorly.  There were dense adhesions between the bladder and the lower uterine segment.    Attention was turned to the patient's right-hand side at this time.  The right fallopian tube was grasped and the Ligasure was used to cauterize and cut through the mesosalpinx and then the proximal tube.  The specimen was sent to pathology.  The right utero-ovarian ligament was similarly cauterized and cut with the Ligasure.  The right round ligament was then cauterized and divided with same instrument.  Dissection was performed to the anterior and posterior leaves of the broad ligaments using the Harmonic scalpel.  The peritoneum was taken down posteriorly.  The incision was carried across the anterior cul- de-sac along the vesicouterine fold.  The bladder was retrograde filled and the bladder edge was identified.  The  vesicouterine fold was then dissected further with the Harmonic scalpel.  The normal saline in the bladder was released.  The right uterine artery was skeletonized.   It was then cauterized and cut with the Ligasure instrument.  The same was performed to the left uterine artery.    The KOH ring was nicely visible.  The colpotomy incision was performed with the Harmonic scalpel in a circumferential fashion. The uterine specimen was then removed from the peritoneal cavity and was later sent to Pathology.  The vaginal cuff was sutured at this time using a running suture of 0 V-Loc.  The vagina was closed from the patient's right hand side to the left hand side and then back 2 sutures towards the midline.  This provided good full-thickness closure of the vaginal cuff.  The laparoscopic needle for suturing was removed from the peritoneal cavity.  The pelvis was irrigated and suctioned.   The pneumoperitoneal was let down.  There was bleeding from the right pelvic side wall near the round ligament.  The ligament was grasped with the Ligasure and pulled away from the sidewall prior to cauterizing.  Hemostasis was then good.   Carleene Overlie was placed in the pelvis over the operating sites.   The pneumoperitoneum was released, the patient received manual breaths, and the trocars were removed.    The patient's Foley catheter was removed, and cystoscopy was performed.  the findings are as noted above.  The Foley catheter was left out.   Final inspection of the vagina demonstrated good hemostasis of the vaginal cuff.  All skin incisions were closed with subcuticular sutures of 4-0 Vicryl.  Dermabond was placed over the incisions.  This concluded the patient's procedure.  She was extubated and escorted to the recovery room in stable and awake condition.  There were no complications to the procedure.  All needle, instrument, and sponge counts were correct.  An MD assistant was necessary for tissue manipulation,  management of instrumentation, retraction and positioning due to the complexity of the case.

## 2019-12-18 NOTE — H&P (Signed)
Office Visit  11/30/2019 Nogal Silva, Everardo All, MD Obstetrics and Gynecology  Abnormal uterine bleeding Dx  Surgical consult ; Referred by Chesley Noon, MD Reason for Visit  Additional Documentation  Vitals:  BP 118/76  Pulse 80  Ht 5' 5.5" (1.664 m)  Wt 73.9 kg  BMI 26.71 kg/m  BSA 1.85 m    More Vitals  Flowsheets:  Anthropometrics,  NEWS,  MEWS Score,  Method of Visit    Encounter Info:  Billing Info,  History,  Allergies,  Detailed Report    All Notes   Progress Notes by Nunzio Cobbs, MD at 11/30/2019 11:30 AM Author: Nunzio Cobbs, MD Author Type: Physician Filed: 12/03/2019  7:27 AM  Note Status: Signed Cosign: Cosign Not Required Encounter Date: 11/30/2019  Editor: Nunzio Cobbs, MD (Physician)      Prior Versions: 1. Lowella Fairy, CMA (Certified Psychologist, sport and exercise) at 11/30/2019 11:32 AM - Sign when Signing Visit       GYNECOLOGY  VISIT   HPI: 49 y.o.   Married  [place]  female   [phone] with No LMP recorded.   here for surgery consult.     Patient has menorrhagia, probable adenomyosis, a small uterine fibroid, and a history of expelling Mirena IUD this summer in July when she presented to the ER.    A prior endometrial biopsy done at the time of her IUD placement did show a benign polyp, which was not expected due to a thin endometrial lining at the time of her prior US.    Her bleeding is currently controlled on Aygestin 5 mg po bid. Mostly having spotting but an occasional increase in bleeding.  She is having cramping which is escalating.  May have forgotten a dosage of the Aygestin.   Feels like she is gaining weight.    Hgb 15.1 on 09/14/19.   She desires hysterectomy, is status post bilateral tubal ligation, and declines future childbearing.   FH breast cancer and recent personal negative genetic testing.   She has occasional rectal bleeding  from her hemorrhoids.  She sees Dr. Collene Mares.    She states it takes a while for her to clot when she bleeds.  No problems with excessive bleeding with her Cesarean Section.  No problems with current bleeding with brushing her teeth.    Complete her Covid vaccine.  She will do her flu vaccine in November.    GYNECOLOGIC HISTORY: No LMP recorded. Contraception: Tubal Menopausal hormone therapy: Aygestin 5 mg po bid. Last mammogram:  07-20-19 Bil.Br.MRI/Neg/density C/BiRads1 Last pap smear:  10-15-17 Neg:Neg HR HPV, 04-20-14 Neg:Neg HR HPV                 OB History     Gravida  3   Para  2   Term  2   Preterm      AB  1   Living  2      SAB  1   TAB      Ectopic      Multiple      Live Births  2                     Patient Active Problem List    Diagnosis Date Noted  . Genetic testing 10/07/2019  . Family history of breast cancer    . Discomfort in chest 10/29/2016  . Chronic  fatigue 10/29/2016  . Allergic rhinitis due to allergen 10/29/2016  . Vitamin D deficiency 10/29/2016          Past Medical History:  Diagnosis Date  . Anemia    . Anxiety    . Endometrial polyp 03/22/2019  . Family history of breast cancer    . Fibroid    . Heart murmur      pt. thinks she has a benign murmur dx'd during pregnancy  . Mitral regurgitation      a. Prior 2D echo in 2014 showed technically limited study, EF 60%, mild MR, trace TR.  . Right ovarian cyst 07/29/2019           Past Surgical History:  Procedure Laterality Date  . CESAREAN SECTION   2009  . IUD REMOVAL   07/29/2019    expelling IUD  . TUBAL LIGATION   2009            Current Outpatient Medications  Medication Sig Dispense Refill  . acetaminophen (TYLENOL) 500 MG tablet Take 1,000 mg by mouth 2 (two) times daily as needed.      . Ascorbic Acid (VITAMIN C) 1000 MG tablet Take 1,000 mg by mouth daily.      . cetirizine (ZYRTEC) 10 MG tablet Take 1 tablet by mouth as needed for allergies.        . Cholecalciferol (VITAMIN D-3) 25 MCG (1000 UT) CAPS Take 1 capsule by mouth daily.      . ferrous sulfate 325 (65 FE) MG tablet Take by mouth daily.      Marland Kitchen FLUoxetine (PROZAC) 20 MG capsule Take 2 capsules by mouth daily.    0  . norethindrone (AYGESTIN) 5 MG tablet TAKE 1 TABLET(5 MG) BY MOUTH IN THE MORNING AND AT BEDTIME 180 tablet 0  . polyethylene glycol (MIRALAX / GLYCOLAX) packet Take 17 g by mouth daily as needed for mild constipation. Takes 1/2 capful daily       . ALPRAZolam (XANAX) 0.5 MG tablet Take 0.25-0.5 mg by mouth 3 (three) times daily as needed for anxiety.  (Patient not taking: Reported on 11/30/2019)      . diclofenac (VOLTAREN) 75 MG EC tablet Take 75 mg by mouth 2 (two) times daily. (Patient not taking: Reported on 11/30/2019)        No current facility-administered medications for this visit.      ALLERGIES: Sulfa antibiotics and Sulfamethoxazole-trimethoprim        Family History  Problem Relation Age of Onset  . Hypertension Mother    . Arrhythmia Mother    . Depression Mother    . Heart disease Mother    . Heart failure Mother    . Diabetes Mother    . Hypertension Father    . Cancer Paternal Grandmother          tumor in spine  . Dementia Paternal Grandfather    . Breast cancer Sister 35      Social History         Socioeconomic History  . Marital status: Married      Spouse name: Not on file  . Number of children: Not on file  . Years of education: Not on file  . Highest education level: Not on file  Occupational History  . Occupation: housewife  Tobacco Use  . Smoking status: Never Smoker  . Smokeless tobacco: Never Used  Vaping Use  . Vaping Use: Never used  Substance and Sexual Activity  .  Alcohol use: No      Alcohol/week: 0.0 standard drinks  . Drug use: No  . Sexual activity: Yes      Partners: Male      Birth control/protection: Surgical      Comment: Tubal  Other Topics Concern  . Not on file  Social History Narrative     Lives with husband and children.     Housewife.     Social Determinants of Health       Financial Resource Strain:   . Difficulty of Paying Living Expenses: Not on file  Food Insecurity:   . Worried About Charity fundraiser in the Last Year: Not on file  . Ran Out of Food in the Last Year: Not on file  Transportation Needs:   . Lack of Transportation (Medical): Not on file  . Lack of Transportation (Non-Medical): Not on file  Physical Activity:   . Days of Exercise per Week: Not on file  . Minutes of Exercise per Session: Not on file  Stress:   . Feeling of Stress : Not on file  Social Connections:   . Frequency of Communication with Friends and Family: Not on file  . Frequency of Social Gatherings with Friends and Family: Not on file  . Attends Religious Services: Not on file  . Active Member of Clubs or Organizations: Not on file  . Attends Archivist Meetings: Not on file  . Marital Status: Not on file  Intimate Partner Violence:   . Fear of Current or Ex-Partner: Not on file  . Emotionally Abused: Not on file  . Physically Abused: Not on file  . Sexually Abused: Not on file      Review of Systems  All other systems reviewed and are negative.     PHYSICAL EXAMINATION:     BP 118/76   Pulse 80   Ht 5' 5.5" (1.664 m)   Wt 163 lb (73.9 kg)   BMI 26.71 kg/m     General appearance: alert, cooperative and appears stated age Head: Normocephalic, without obvious abnormality, atraumatic Neck: no adenopathy, supple, symmetrical, trachea midline and thyroid normal to inspection and palpation Lungs: clear to auscultation bilaterally Heart: regular rate and rhythm Abdomen: soft, non-tender, no masses,  no organomegaly Extremities: extremities normal, atraumatic, no cyanosis or edema Skin: Skin color, texture, turgor normal. No rashes or lesions Lymph nodes: Cervical, supraclavicular, and axillary nodes normal. No abnormal inguinal nodes palpated Neurologic:  Grossly normal   Pelvic: External genitalia:  no lesions              Urethra:  normal appearing urethra with no masses, tenderness or lesions              Bartholins and Skenes: normal                 Vagina: normal appearing vagina with normal color and discharge, no lesions              Cervix: no lesions                Bimanual Exam:  Uterus:  normal size, contour, position, consistency, mobility, non-tender              Adnexa: no mass, fullness, tenderness         Chaperone was present for exam.   ASSESSMENT   Menorrhagia controlled on Aygestin.  Adenomyosis suspected. Easy bleeding.  Hx C/S and BTL.  FH breast cancer.  Negative personal genetic testing.  Hx mitral regurgitation.    PLAN   CBC now, drawn at the end of the day, prior to knowing of the bleeding history.   Plan will be laparoscopic hysterectomy with bilateral salpingectomy, possible oophorectomy, cystoscopy.  Risks, benefits, and alternatives reviewed with the patient who wishes to proceed.  Possible conversion to laparotomy reviewed.  Outpatient procedure planned.  Surgical expectations and recovery reviewed. Check PT and INR at preop hospital visit.  Check EKG at preop. Avoid NSAIDs.  Continue Aygestin. Call for any heavy bleeding prior to surgery.

## 2019-12-18 NOTE — Progress Notes (Signed)
Update to History and Physical  No marked change in status since office preop visit.  Patient examined.  Normal coagulation studies. OK to proceed with surgery.  Received Lovenox.

## 2019-12-18 NOTE — Progress Notes (Signed)
Day of Surgery Procedure(s) (LRB): TOTAL LAPAROSCOPIC HYSTERECTOMY WITH SALPINGECTOMY and lysis of adhesions (N/A) CYSTOSCOPY (N/A)  Subjective: Patient reports tolerating PO.   Denies pain.  Voided 100 cc and had PVR 170 cc.  States history of urinary retention following Cesarean Section.  Wants to try voiding again.   Objective: I have reviewed patient's vital signs, intake and output and labs. Vitals:   12/18/19 1245 12/18/19 1645  BP: 108/69 127/66  Pulse: 85 90  Resp: 20 20  Temp: 97.8 F (36.6 C) 98 F (36.7 C)  SpO2: 96% 98%   I/O - 2140 cc IV + po/450 cc uo CBC    Component Value Date/Time   WBC 9.4 12/18/2019 1630   RBC 4.60 12/18/2019 1630   HGB 14.1 12/18/2019 1630   HGB 15.8 11/30/2019 1154   HCT 42.1 12/18/2019 1630   HCT 46.9 (H) 11/30/2019 1154   PLT 197 12/18/2019 1630   PLT 239 11/30/2019 1154   MCV 91.5 12/18/2019 1630   MCV 91 11/30/2019 1154   MCH 30.7 12/18/2019 1630   MCHC 33.5 12/18/2019 1630   RDW 13.0 12/18/2019 1630   RDW 12.4 11/30/2019 1154   LYMPHSABS 1.1 02/26/2016 1547   EOSABS 0.1 02/26/2016 1547   BASOSABS 0.0 02/26/2016 1547     General: alert and cooperative Resp: clear to auscultation bilaterally Cardio: regular rate and rhythm, S1, S2 normal, no murmur, click, rub or gallop GI: soft, non-tender; bowel sounds normal; no masses,  no organomegaly, incision: clean, dry and intact and some distention Extremities: PAS and ted hose on.  DPs 2+ bilaterally.  Vaginal Bleeding: none  Assessment: s/p Procedure(s): TOTAL LAPAROSCOPIC HYSTERECTOMY WITH SALPINGECTOMY and lysis of adhesions (N/A) CYSTOSCOPY (N/A): stable  Mild urinary retention.  Plan: Encourage ambulation  Oral pain medication.  Will continue in hospital care tonight.  Plan for foley if unable to void adequately.  Surgical findings and procedure reviewed with patient.  Will have patient see urology for potential bladder wall cyst as outpatient.   LOS: 0 days     Arloa Koh 12/18/2019, 5:26 PM

## 2019-12-18 NOTE — Anesthesia Procedure Notes (Signed)
Procedure Name: Intubation Date/Time: 12/18/2019 7:41 AM Performed by: Justice Rocher, CRNA Pre-anesthesia Checklist: Patient identified, Emergency Drugs available, Suction available, Patient being monitored and Timeout performed Patient Re-evaluated:Patient Re-evaluated prior to induction Oxygen Delivery Method: Circle system utilized Preoxygenation: Pre-oxygenation with 100% oxygen Induction Type: IV induction Ventilation: Mask ventilation without difficulty Laryngoscope Size: Mac and 3 Tube type: Oral Tube size: 7.0 mm Number of attempts: 1 Airway Equipment and Method: Stylet and Oral airway Placement Confirmation: ETT inserted through vocal cords under direct vision,  positive ETCO2 and breath sounds checked- equal and bilateral Secured at: 22 cm Tube secured with: Tape Dental Injury: Teeth and Oropharynx as per pre-operative assessment

## 2019-12-18 NOTE — Transfer of Care (Signed)
Immediate Anesthesia Transfer of Care Note  Patient: Joanna Yang  Procedure(s) Performed: Procedure(s) (LRB): TOTAL LAPAROSCOPIC HYSTERECTOMY WITH SALPINGECTOMY and lysis of adhesions (N/A) CYSTOSCOPY (N/A)  Patient Location: PACU  Anesthesia Type: General  Level of Consciousness: awake, sedated, patient cooperative and responds to stimulation  Airway & Oxygen Therapy: Patient Spontanous Breathing and Patient connected to [place] 02 and soft FM   Post-op Assessment: Report given to PACU RN, Post -op Vital signs reviewed and stable and Patient moving all extremities  Post vital signs: Reviewed and stable  Complications: No apparent anesthesia complications

## 2019-12-19 ENCOUNTER — Encounter (HOSPITAL_BASED_OUTPATIENT_CLINIC_OR_DEPARTMENT_OTHER): Payer: Self-pay | Admitting: Obstetrics and Gynecology

## 2019-12-19 ENCOUNTER — Telehealth: Payer: Self-pay

## 2019-12-19 LAB — BASIC METABOLIC PANEL
Anion gap: 7 (ref 5–15)
BUN: 9 mg/dL (ref 6–20)
CO2: 25 mmol/L (ref 22–32)
Calcium: 8.6 mg/dL — ABNORMAL LOW (ref 8.9–10.3)
Chloride: 106 mmol/L (ref 98–111)
Creatinine, Ser: 0.74 mg/dL (ref 0.44–1.00)
GFR, Estimated: >60 mL/min (ref >60)
Glucose, Bld: 112 mg/dL — ABNORMAL HIGH (ref 70–99)
Potassium: 3.8 mmol/L (ref 3.5–5.1)
Sodium: 138 mmol/L (ref 135–145)

## 2019-12-19 LAB — CBC
HCT: 40.7 % (ref 36.0–46.0)
Hemoglobin: 13.6 g/dL (ref 12.0–15.0)
MCH: 30.7 pg (ref 26.0–34.0)
MCHC: 33.4 g/dL (ref 30.0–36.0)
MCV: 91.9 fL (ref 80.0–100.0)
Platelets: 195 10*3/uL (ref 150–400)
RBC: 4.43 MIL/uL (ref 3.87–5.11)
RDW: 13.2 % (ref 11.5–15.5)
WBC: 8.1 10*3/uL (ref 4.0–10.5)
nRBC: 0 % (ref 0.0–0.2)

## 2019-12-19 LAB — SURGICAL PATHOLOGY

## 2019-12-19 MED ORDER — IBUPROFEN 800 MG PO TABS: 800.0000 mg | ORAL_TABLET | Freq: Three times a day (TID) | ORAL | 0 refills | Status: DC | PRN

## 2019-12-19 MED ORDER — OXYCODONE-ACETAMINOPHEN 5-325 MG PO TABS
ORAL_TABLET | ORAL | Status: AC
  Filled 2019-12-19: qty 1

## 2019-12-19 MED ORDER — OXYCODONE-ACETAMINOPHEN 5-325 MG PO TABS
1.0000 | ORAL_TABLET | ORAL | 0 refills | Status: DC | PRN
Start: 2019-12-19 — End: 2019-12-26

## 2019-12-19 MED ORDER — KETOROLAC TROMETHAMINE 30 MG/ML IJ SOLN
INTRAMUSCULAR | Status: AC
  Filled 2019-12-19: qty 1

## 2019-12-19 MED ORDER — SIMETHICONE 80 MG PO CHEW: 80.0000 mg | CHEWABLE_TABLET | Freq: Four times a day (QID) | ORAL | 0 refills | Status: DC | PRN

## 2019-12-19 MED ORDER — BISACODYL 10 MG RE SUPP
RECTAL | Status: AC
  Filled 2019-12-19: qty 1

## 2019-12-19 MED ORDER — BISACODYL 10 MG RE SUPP
10.0000 mg | Freq: Once | RECTAL | Status: AC
  Administered 2019-12-19: 10 mg via RECTAL

## 2019-12-19 MED ORDER — BISACODYL 10 MG RE SUPP: 10.0000 mg | Freq: Every day | RECTAL | Status: DC | PRN

## 2019-12-19 MED ORDER — SIMETHICONE 80 MG PO CHEW
CHEWABLE_TABLET | ORAL | Status: AC
  Filled 2019-12-19: qty 1

## 2019-12-19 NOTE — Telephone Encounter (Signed)
Spoke with patient. Patient is s/p TLH 12/18/19, calling to schedule post-op f/u w/ Dr. [name] on 10/28. Patient denies any current concerns. Patient has transportation b/w 10am and 2pm. OV scheduled for 10/28 at 11:15am. Patient is aware this is a work in appt. She is aware to contact the office if any concerns prior to appt. Advised patient I will update D.r [name] and return call if any additional recommendations.   Routing to provider for final review. Patient is agreeable to disposition. Will close encounter.

## 2019-12-19 NOTE — Progress Notes (Signed)
1 Day Post-Op Procedure(s) (LRB): TOTAL LAPAROSCOPIC HYSTERECTOMY WITH SALPINGECTOMY and lysis of adhesions (N/A) CYSTOSCOPY (N/A)  Subjective: Patient reports + flatus and bowel movement following a Dulcolax suppository States she had a full void at the time of the BM.  Her PVR was zero. She feels upper abdominal cramping.  Reports that she frequently deals with abdominal distention just like this at home in her normal routine.  Miralax controls it.   She feels ready to go home.   Objective: I have reviewed patient's vital signs, intake and output and labs. Vitals:   12/19/19 0758 12/19/19 1104  BP: 107/75 111/67  Pulse: 65 70  Resp: 16 17  Temp: 98 F (36.7 C) 98.2 F (36.8 C)  SpO2: 95% 97%   CBC    Component Value Date/Time   WBC 8.1 12/19/2019 0801   RBC 4.43 12/19/2019 0801   HGB 13.6 12/19/2019 0801   HGB 15.8 11/30/2019 1154   HCT 40.7 12/19/2019 0801   HCT 46.9 (H) 11/30/2019 1154   PLT 195 12/19/2019 0801   PLT 239 11/30/2019 1154   MCV 91.9 12/19/2019 0801   MCV 91 11/30/2019 1154   MCH 30.7 12/19/2019 0801   MCHC 33.4 12/19/2019 0801   RDW 13.2 12/19/2019 0801   RDW 12.4 11/30/2019 1154   LYMPHSABS 1.1 02/26/2016 1547   EOSABS 0.1 02/26/2016 1547   BASOSABS 0.0 02/26/2016 1547   BMET - Glucose 112, Calcium 8.6, Cr 0.74.   GI: incision: clean, dry and intact and abdomen distended with active normal bowel sounds, nontender.   Assessment: s/p Procedure(s): TOTAL LAPAROSCOPIC HYSTERECTOMY WITH SALPINGECTOMY and lysis of adhesions (N/A) CYSTOSCOPY (N/A): progressing well  She does appear to have some ileus but no acute abdomen.   Plan: Discharge home  She will follow a soft diet at home.  Rx for Percocet and Motrin.  Post surgical instructions reviewed in oral and written form.  Ileus precautions reviewed with patient.  She will follow up in office in 2 days, sooner if needed.    LOS: 0 days    Arloa Koh 12/19/2019, 11:06 AM

## 2019-12-19 NOTE — Progress Notes (Signed)
Void 37ml, PVR 253ml, I&o cath done, 261ml hazy yellow  Urine obtained. Ambulated in hall without difficulties.

## 2019-12-19 NOTE — Telephone Encounter (Signed)
Patient is calling for an appointment for Thursday (12/21/19) morning. Patient stated Dr. [name] wanted hetto come in this day for a post op problem check.

## 2019-12-19 NOTE — Progress Notes (Signed)
1 Day Post-Op Procedure(s) (LRB): TOTAL LAPAROSCOPIC HYSTERECTOMY WITH SALPINGECTOMY and lysis of adhesions (N/A) CYSTOSCOPY (N/A)  Subjective: Patient reports + flatus.   Just had PVR for 250 cc.   Objective: I have reviewed patient's vitals and input/output.  Vitals:   12/18/19 2352 12/19/19 0400  BP: 100/61 108/72  Pulse: 76 67  Resp: 16 16  Temp: 98.9 F (37.2 C) 98.1 F (36.7 C)  SpO2: 95% 96%    I/O - 5100 cc/2937 cc General: alert and cooperative Resp: clear to auscultation bilaterally Cardio: regular rate and rhythm, S1, S2 normal, no murmur, click, rub or gallop GI: incision: clean, dry and intact and Abdomen is distended and nontender. Extremities: PAS and Ted hose on. Vaginal Bleeding: none  Assessment: s/p Procedure(s): TOTAL LAPAROSCOPIC HYSTERECTOMY WITH SALPINGECTOMY and lysis of adhesions (N/A) CYSTOSCOPY (N/A): abdominal distention and urinary retention.     Plan: Dulcolax suppository.  Will check CBC and BMP. Ambulate.   LOS: 0 days    Arloa Koh 12/19/2019, 7:26 AM

## 2019-12-19 NOTE — Discharge Instructions (Signed)

## 2019-12-19 NOTE — Anesthesia Postprocedure Evaluation (Signed)
Anesthesia Post Note  Patient: Joanna Yang  Procedure(s) Performed: TOTAL LAPAROSCOPIC HYSTERECTOMY WITH SALPINGECTOMY and lysis of adhesions (N/A Uterus) CYSTOSCOPY (N/A )     Patient location during evaluation: PACU Anesthesia Type: General Level of consciousness: awake and alert Pain management: pain level controlled Vital Signs Assessment: post-procedure vital signs reviewed and stable Respiratory status: spontaneous breathing, nonlabored ventilation and respiratory function stable Cardiovascular status: blood pressure returned to baseline and stable Postop Assessment: no apparent nausea or vomiting Anesthetic complications: no   No complications documented.  Last Vitals:  Vitals:   12/18/19 2352 12/19/19 0400  BP: 100/61 108/72  Pulse: 76 67  Resp: 16 16  Temp: 37.2 C 36.7 C  SpO2: 95% 96%    Last Pain:  Vitals:   12/19/19 0433  TempSrc:   PainSc: [place]

## 2019-12-20 NOTE — Progress Notes (Deleted)
GYNECOLOGY  VISIT   HPI: 49 y.o.   Married  [place]  female   [phone] with No LMP recorded.   here for 3 days status post TOTAL LAPAROSCOPIC HYSTERECTOMY WITH SALPINGECTOMY and lysis of adhesions (N/A Uterus) CYSTOSCOPY (N/A ).   GYNECOLOGIC HISTORY: No LMP recorded. Contraception: Tubal/Hyst Menopausal hormone therapy:  *** Last mammogram: 07-20-19 Bil.Br.MRI/Neg/density C/BiRads1 Last pap smear: 10-15-17 Neg:Neg HR HPV,04-20-14 Neg:Neg HR HPV        OB History    Gravida  3   Para  2   Term  2   Preterm      AB  1   Living  2     SAB  1   TAB      Ectopic      Multiple      Live Births  2              Patient Active Problem List   Diagnosis Date Noted  . Status post surgery 12/18/2019  . Status post laparoscopic hysterectomy 12/18/2019  . Genetic testing 10/07/2019  . Family history of breast cancer   . Discomfort in chest 10/29/2016  . Chronic fatigue 10/29/2016  . Allergic rhinitis due to allergen 10/29/2016  . Vitamin D deficiency 10/29/2016    Past Medical History:  Diagnosis Date  . Anemia   . Anxiety   . Complication of anesthesia   . Endometrial polyp 03/22/2019  . Family history of breast cancer   . Fibroid   . Heart murmur    pt. thinks she has a benign murmur dx'd during pregnancy  . Mitral regurgitation    a. Prior 2D echo in 2014 showed technically limited study, EF 60%, mild MR, trace TR.  Marland Kitchen Pneumonia   . PONV (postoperative nausea and vomiting)   . Right ovarian cyst 07/29/2019    Past Surgical History:  Procedure Laterality Date  . CESAREAN SECTION  2009  . COLONOSCOPY    . CYSTOSCOPY N/A 12/18/2019   Procedure: CYSTOSCOPY;  Surgeon: Nunzio Cobbs, MD;  Location: [facility];  Service: Gynecology;  Laterality: N/A;  . ESOPHAGOGASTRODUODENOSCOPY ENDOSCOPY    . IUD REMOVAL  07/29/2019   expelling IUD  . TOTAL LAPAROSCOPIC HYSTERECTOMY WITH SALPINGECTOMY N/A 12/18/2019   Procedure: TOTAL  LAPAROSCOPIC HYSTERECTOMY WITH SALPINGECTOMY and lysis of adhesions;  Surgeon: Nunzio Cobbs, MD;  Location: [facility];  Service: Gynecology;  Laterality: N/A;  . TUBAL LIGATION  2009    Current Outpatient Medications  Medication Sig Dispense Refill  . ALPRAZolam (XANAX) 0.5 MG tablet Take 0.25-0.5 mg by mouth 3 (three) times daily as needed for anxiety.     . Ascorbic Acid (VITAMIN C) 1000 MG tablet Take 1,000 mg by mouth daily.     . cetirizine (ZYRTEC) 10 MG tablet Take 10 mg by mouth daily as needed for allergies.     . Cholecalciferol (VITAMIN D-3) 25 MCG (1000 UT) CAPS Take 1 capsule by mouth daily.     Marland Kitchen FLUoxetine (PROZAC) 20 MG capsule Take 40 mg by mouth daily.   0  . ibuprofen (ADVIL) 800 MG tablet Take 1 tablet (800 mg total) by mouth every 8 (eight) hours as needed for mild pain or moderate pain. 30 tablet 0  . oxyCODONE-acetaminophen (PERCOCET/ROXICET) 5-325 MG tablet Take 1-2 tablets by mouth every 4 (four) hours as needed for moderate pain. 20 tablet 0  . polyethylene glycol (MIRALAX / GLYCOLAX) packet Take  8.5 g by mouth daily. Takes 1/2 capful daily     . simethicone (MYLICON) 80 MG chewable tablet Chew 1 tablet (80 mg total) by mouth 4 (four) times daily as needed for flatulence. 30 tablet 0   No current facility-administered medications for this visit.     ALLERGIES: Sulfa antibiotics and Sulfamethoxazole-trimethoprim  Family History  Problem Relation Age of Onset  . Hypertension Mother   . Arrhythmia Mother   . Depression Mother   . Heart disease Mother   . Heart failure Mother   . Diabetes Mother   . Hypertension Father   . Cancer Paternal Grandmother        tumor in spine  . Dementia Paternal Grandfather   . Breast cancer Sister 26    Social History   Socioeconomic History  . Marital status: Married    Spouse name: Not on file  . Number of children: Not on file  . Years of education: Not on file  . Highest education level:  Not on file  Occupational History  . Occupation: housewife  Tobacco Use  . Smoking status: Never Smoker  . Smokeless tobacco: Never Used  Vaping Use  . Vaping Use: Never used  Substance and Sexual Activity  . Alcohol use: No    Alcohol/week: 0.0 standard drinks  . Drug use: No  . Sexual activity: Yes    Partners: Male    Birth control/protection: Surgical    Comment: Tubal  Other Topics Concern  . Not on file  Social History Narrative   Lives with husband and children.    Housewife.    Social Determinants of Health   Financial Resource Strain:   . Difficulty of Paying Living Expenses: Not on file  Food Insecurity:   . Worried About Charity fundraiser in the Last Year: Not on file  . Ran Out of Food in the Last Year: Not on file  Transportation Needs:   . Lack of Transportation (Medical): Not on file  . Lack of Transportation (Non-Medical): Not on file  Physical Activity:   . Days of Exercise per Week: Not on file  . Minutes of Exercise per Session: Not on file  Stress:   . Feeling of Stress : Not on file  Social Connections:   . Frequency of Communication with Friends and Family: Not on file  . Frequency of Social Gatherings with Friends and Family: Not on file  . Attends Religious Services: Not on file  . Active Member of Clubs or Organizations: Not on file  . Attends Archivist Meetings: Not on file  . Marital Status: Not on file  Intimate Partner Violence:   . Fear of Current or Ex-Partner: Not on file  . Emotionally Abused: Not on file  . Physically Abused: Not on file  . Sexually Abused: Not on file    Review of Systems  PHYSICAL EXAMINATION:    There were no vitals taken for this visit.    General appearance: alert, cooperative and appears stated age Head: Normocephalic, without obvious abnormality, atraumatic Neck: no adenopathy, supple, symmetrical, trachea midline and thyroid normal to inspection and palpation Lungs: clear to auscultation  bilaterally Breasts: normal appearance, no masses or tenderness, No nipple retraction or dimpling, No nipple discharge or bleeding, No axillary or supraclavicular adenopathy Heart: regular rate and rhythm Abdomen: soft, non-tender, no masses,  no organomegaly Extremities: extremities normal, atraumatic, no cyanosis or edema Skin: Skin color, texture, turgor normal. No rashes or lesions Lymph  nodes: Cervical, supraclavicular, and axillary nodes normal. No abnormal inguinal nodes palpated Neurologic: Grossly normal  Pelvic: External genitalia:  no lesions              Urethra:  normal appearing urethra with no masses, tenderness or lesions              Bartholins and Skenes: normal                 Vagina: normal appearing vagina with normal color and discharge, no lesions              Cervix: no lesions                Bimanual Exam:  Uterus:  normal size, contour, position, consistency, mobility, non-tender              Adnexa: no mass, fullness, tenderness              Rectal exam: {yes no:[id number]}.  Confirms.              Anus:  normal sphincter tone, no lesions  Chaperone was present for exam.  ASSESSMENT     PLAN     An After Visit Summary was printed and given to the patient.  ______ minutes face to face time of which over 50% was spent in counseling.

## 2019-12-21 ENCOUNTER — Ambulatory Visit (INDEPENDENT_AMBULATORY_CARE_PROVIDER_SITE_OTHER): Payer: Managed Care, Other (non HMO) | Admitting: Obstetrics and Gynecology

## 2019-12-21 ENCOUNTER — Inpatient Hospital Stay (HOSPITAL_COMMUNITY)
Admission: EM | Admit: 2019-12-21 | Discharge: 2019-12-26 | DRG: 331 | Disposition: A | Discharge status: Home Health | Payer: Managed Care, Other (non HMO) | Source: Ambulatory Visit | Attending: General Surgery | Admitting: General Surgery

## 2019-12-21 ENCOUNTER — Other Ambulatory Visit: Payer: Self-pay

## 2019-12-21 ENCOUNTER — Encounter: Payer: Self-pay | Admitting: Obstetrics and Gynecology

## 2019-12-21 ENCOUNTER — Inpatient Hospital Stay (HOSPITAL_COMMUNITY): Payer: Managed Care, Other (non HMO) | Admitting: Certified Registered Nurse Anesthetist

## 2019-12-21 ENCOUNTER — Encounter (HOSPITAL_COMMUNITY): Payer: Self-pay

## 2019-12-21 ENCOUNTER — Encounter (HOSPITAL_COMMUNITY): Admission: EM | Disposition: A | Discharge status: Home Health | Payer: Self-pay | Source: Ambulatory Visit

## 2019-12-21 ENCOUNTER — Emergency Department (HOSPITAL_COMMUNITY): Payer: Managed Care, Other (non HMO)

## 2019-12-21 ENCOUNTER — Ambulatory Visit: Payer: Self-pay | Admitting: Obstetrics and Gynecology

## 2019-12-21 ENCOUNTER — Telehealth: Payer: Self-pay | Admitting: *Deleted

## 2019-12-21 VITALS — BP 118/78 | HR 86 | Temp 98.5°F | Ht 65.5 in | Wt 166.0 lb

## 2019-12-21 DIAGNOSIS — F419 Anxiety disorder, unspecified: Secondary | ICD-10-CM | POA: Diagnosis present

## 2019-12-21 DIAGNOSIS — Z882 Allergy status to sulfonamides status: Secondary | ICD-10-CM

## 2019-12-21 DIAGNOSIS — K5909 Other constipation: Secondary | ICD-10-CM | POA: Diagnosis present

## 2019-12-21 DIAGNOSIS — K5939 Other megacolon: Principal | ICD-10-CM | POA: Diagnosis present

## 2019-12-21 DIAGNOSIS — Z888 Allergy status to other drugs, medicaments and biological substances status: Secondary | ICD-10-CM | POA: Diagnosis not present

## 2019-12-21 DIAGNOSIS — Z79899 Other long term (current) drug therapy: Secondary | ICD-10-CM

## 2019-12-21 DIAGNOSIS — Z90710 Acquired absence of both cervix and uterus: Secondary | ICD-10-CM | POA: Diagnosis not present

## 2019-12-21 DIAGNOSIS — K460 Unspecified abdominal hernia with obstruction, without gangrene: Secondary | ICD-10-CM | POA: Diagnosis present

## 2019-12-21 DIAGNOSIS — K458 Other specified abdominal hernia without obstruction or gangrene: Secondary | ICD-10-CM | POA: Diagnosis present

## 2019-12-21 DIAGNOSIS — Z9079 Acquired absence of other genital organ(s): Secondary | ICD-10-CM | POA: Diagnosis not present

## 2019-12-21 DIAGNOSIS — Z20822 Contact with and (suspected) exposure to covid-19: Secondary | ICD-10-CM | POA: Diagnosis present

## 2019-12-21 DIAGNOSIS — K567 Ileus, unspecified: Secondary | ICD-10-CM

## 2019-12-21 DIAGNOSIS — K56609 Unspecified intestinal obstruction, unspecified as to partial versus complete obstruction: Secondary | ICD-10-CM

## 2019-12-21 DIAGNOSIS — K56699 Other intestinal obstruction unspecified as to partial versus complete obstruction: Secondary | ICD-10-CM | POA: Diagnosis present

## 2019-12-21 HISTORY — DX: Unspecified intestinal obstruction, unspecified as to partial versus complete obstruction: K56.609

## 2019-12-21 HISTORY — PX: LAPAROTOMY: SHX154

## 2019-12-21 LAB — CBC WITH DIFFERENTIAL/PLATELET
Abs Immature Granulocytes: 0.02 10*3/uL (ref 0.00–0.07)
Basophils Absolute: 0 10*3/uL (ref 0.0–0.1)
Basophils Relative: 0 %
Eosinophils Absolute: 0 10*3/uL (ref 0.0–0.5)
Eosinophils Relative: 1 %
HCT: 43.8 % (ref 36.0–46.0)
Hemoglobin: 14.9 g/dL (ref 12.0–15.0)
Immature Granulocytes: 0 %
Lymphocytes Relative: 13 %
Lymphs Abs: 0.8 10*3/uL (ref 0.7–4.0)
MCH: 30.4 pg (ref 26.0–34.0)
MCHC: 34 g/dL (ref 30.0–36.0)
MCV: 89.4 fL (ref 80.0–100.0)
Monocytes Absolute: 0.5 10*3/uL (ref 0.1–1.0)
Monocytes Relative: 7 %
Neutro Abs: 4.9 10*3/uL (ref 1.7–7.7)
Neutrophils Relative %: 79 %
Platelets: 216 10*3/uL (ref 150–400)
RBC: 4.9 MIL/uL (ref 3.87–5.11)
RDW: 12.7 % (ref 11.5–15.5)
WBC: 6.3 10*3/uL (ref 4.0–10.5)
nRBC: 0 % (ref 0.0–0.2)

## 2019-12-21 LAB — COMPREHENSIVE METABOLIC PANEL
ALT: 29 U/L (ref 0–44)
AST: 21 U/L (ref 15–41)
Albumin: 4 g/dL (ref 3.5–5.0)
Alkaline Phosphatase: 35 U/L — ABNORMAL LOW (ref 38–126)
Anion gap: 10 (ref 5–15)
BUN: 10 mg/dL (ref 6–20)
CO2: 23 mmol/L (ref 22–32)
Calcium: 8.9 mg/dL (ref 8.9–10.3)
Chloride: 104 mmol/L (ref 98–111)
Creatinine, Ser: 0.83 mg/dL (ref 0.44–1.00)
GFR, Estimated: >60 mL/min (ref >60)
Glucose, Bld: 96 mg/dL (ref 70–99)
Potassium: 3.9 mmol/L (ref 3.5–5.1)
Sodium: 137 mmol/L (ref 135–145)
Total Bilirubin: 1 mg/dL (ref 0.3–1.2)
Total Protein: 7.3 g/dL (ref 6.5–8.1)

## 2019-12-21 LAB — RESPIRATORY PANEL BY RT PCR (FLU A&B, COVID)
Influenza A by PCR: NEGATIVE
Influenza B by PCR: NEGATIVE
SARS Coronavirus 2 by RT PCR: NEGATIVE

## 2019-12-21 SURGERY — LAPAROTOMY, EXPLORATORY
Anesthesia: General

## 2019-12-21 MED ORDER — AMISULPRIDE (ANTIEMETIC) 5 MG/2ML IV SOLN: 10.0000 mg | Freq: Once | INTRAVENOUS | Status: DC | PRN

## 2019-12-21 MED ORDER — SODIUM CHLORIDE 0.9 % IV SOLN: INTRAVENOUS | Status: DC

## 2019-12-21 MED ORDER — LIDOCAINE 2% (20 MG/ML) 5 ML SYRINGE
INTRAMUSCULAR | Status: DC | PRN
  Administered 2019-12-21: 100 mg via INTRAVENOUS

## 2019-12-21 MED ORDER — ACETAMINOPHEN 10 MG/ML IV SOLN
1000.0000 mg | Freq: Four times a day (QID) | INTRAVENOUS | Status: DC
  Administered 2019-12-21 – 2019-12-22 (×3): 1000 mg via INTRAVENOUS
  Filled 2019-12-21 (×3): qty 100

## 2019-12-21 MED ORDER — OXYCODONE HCL 5 MG PO TABS: 5.0000 mg | ORAL_TABLET | Freq: Once | ORAL | Status: DC | PRN

## 2019-12-21 MED ORDER — ALBUMIN HUMAN 5 % IV SOLN
INTRAVENOUS | Status: AC
  Filled 2019-12-21: qty 250

## 2019-12-21 MED ORDER — DIPHENHYDRAMINE HCL 50 MG/ML IJ SOLN
25.0000 mg | Freq: Four times a day (QID) | INTRAMUSCULAR | Status: DC | PRN
  Administered 2019-12-23: 25 mg via INTRAVENOUS
  Filled 2019-12-21 (×2): qty 1

## 2019-12-21 MED ORDER — LACTATED RINGERS IV SOLN: INTRAVENOUS | Status: DC | PRN

## 2019-12-21 MED ORDER — SODIUM CHLORIDE 0.9 % IV SOLN
2.0000 g | INTRAVENOUS | Status: AC
  Administered 2019-12-21: 2 g via INTRAVENOUS

## 2019-12-21 MED ORDER — SUCCINYLCHOLINE CHLORIDE 200 MG/10ML IV SOSY
PREFILLED_SYRINGE | INTRAVENOUS | Status: AC
  Filled 2019-12-21: qty 10

## 2019-12-21 MED ORDER — ONDANSETRON HCL 4 MG/2ML IJ SOLN
INTRAMUSCULAR | Status: AC
  Filled 2019-12-21: qty 2

## 2019-12-21 MED ORDER — METHOCARBAMOL 1000 MG/10ML IJ SOLN
500.0000 mg | Freq: Four times a day (QID) | INTRAVENOUS | Status: DC | PRN
  Administered 2019-12-21: 500 mg via INTRAVENOUS
  Filled 2019-12-21 (×2): qty 5

## 2019-12-21 MED ORDER — DEXAMETHASONE SODIUM PHOSPHATE 10 MG/ML IJ SOLN
INTRAMUSCULAR | Status: AC
  Filled 2019-12-21: qty 1

## 2019-12-21 MED ORDER — PANTOPRAZOLE SODIUM 40 MG IV SOLR
40.0000 mg | Freq: Every day | INTRAVENOUS | Status: DC
  Administered 2019-12-21 – 2019-12-23 (×3): 40 mg via INTRAVENOUS
  Filled 2019-12-21 (×3): qty 40

## 2019-12-21 MED ORDER — MIDAZOLAM HCL 2 MG/2ML IJ SOLN
INTRAMUSCULAR | Status: AC
  Filled 2019-12-21: qty 2

## 2019-12-21 MED ORDER — ROCURONIUM BROMIDE 10 MG/ML (PF) SYRINGE
PREFILLED_SYRINGE | INTRAVENOUS | Status: DC | PRN
  Administered 2019-12-21: 40 mg via INTRAVENOUS

## 2019-12-21 MED ORDER — SODIUM CHLORIDE 0.9 % IR SOLN
Status: DC | PRN
  Administered 2019-12-21: 3000 mL
  Administered 2019-12-21: 1000 mL

## 2019-12-21 MED ORDER — SUGAMMADEX SODIUM 200 MG/2ML IV SOLN
INTRAVENOUS | Status: DC | PRN
  Administered 2019-12-21: 150 mg via INTRAVENOUS

## 2019-12-21 MED ORDER — LACTATED RINGERS IV BOLUS
1000.0000 mL | Freq: Once | INTRAVENOUS | Status: AC
  Administered 2019-12-21: 1000 mL via INTRAVENOUS

## 2019-12-21 MED ORDER — ACETAMINOPHEN 650 MG RE SUPP: 650.0000 mg | Freq: Four times a day (QID) | RECTAL | Status: DC | PRN

## 2019-12-21 MED ORDER — SUCCINYLCHOLINE CHLORIDE 200 MG/10ML IV SOSY
PREFILLED_SYRINGE | INTRAVENOUS | Status: DC | PRN
  Administered 2019-12-21: 100 mg via INTRAVENOUS

## 2019-12-21 MED ORDER — FENTANYL CITRATE (PF) 100 MCG/2ML IJ SOLN
50.0000 ug | Freq: Once | INTRAMUSCULAR | Status: AC | PRN
  Administered 2019-12-21: 50 ug via INTRAVENOUS
  Filled 2019-12-21: qty 2

## 2019-12-21 MED ORDER — ROCURONIUM BROMIDE 10 MG/ML (PF) SYRINGE
PREFILLED_SYRINGE | INTRAVENOUS | Status: AC
  Filled 2019-12-21: qty 10

## 2019-12-21 MED ORDER — METOPROLOL TARTRATE 5 MG/5ML IV SOLN: 5.0000 mg | Freq: Four times a day (QID) | INTRAVENOUS | Status: DC | PRN

## 2019-12-21 MED ORDER — FLUOXETINE HCL 20 MG PO CAPS
40.0000 mg | ORAL_CAPSULE | Freq: Every day | ORAL | Status: DC
  Administered 2019-12-23 – 2019-12-26 (×4): 40 mg via ORAL
  Filled 2019-12-21 (×4): qty 2

## 2019-12-21 MED ORDER — MIDAZOLAM HCL 5 MG/5ML IJ SOLN
INTRAMUSCULAR | Status: DC | PRN
  Administered 2019-12-21: 2 mg via INTRAVENOUS

## 2019-12-21 MED ORDER — ONDANSETRON HCL 4 MG/2ML IJ SOLN: 4.0000 mg | Freq: Once | INTRAMUSCULAR | Status: DC | PRN

## 2019-12-21 MED ORDER — DEXMEDETOMIDINE (PRECEDEX) IN NS 20 MCG/5ML (4 MCG/ML) IV SYRINGE
PREFILLED_SYRINGE | INTRAVENOUS | Status: AC
  Filled 2019-12-21: qty 5

## 2019-12-21 MED ORDER — DEXAMETHASONE SODIUM PHOSPHATE 10 MG/ML IJ SOLN
INTRAMUSCULAR | Status: DC | PRN
  Administered 2019-12-21: 10 mg via INTRAVENOUS

## 2019-12-21 MED ORDER — SIMETHICONE 80 MG PO CHEW
40.0000 mg | CHEWABLE_TABLET | Freq: Four times a day (QID) | ORAL | Status: DC | PRN
  Administered 2019-12-23 – 2019-12-24 (×2): 40 mg via ORAL
  Filled 2019-12-21 (×3): qty 1

## 2019-12-21 MED ORDER — POLYETHYLENE GLYCOL 3350 17 G PO PACK: 17.0000 g | PACK | Freq: Every day | ORAL | Status: DC | PRN

## 2019-12-21 MED ORDER — PHENOL 1.4 % MT LIQD
1.0000 | OROMUCOSAL | Status: DC | PRN
  Administered 2019-12-21 – 2019-12-22 (×2): 1 via OROMUCOSAL
  Filled 2019-12-21 (×2): qty 177

## 2019-12-21 MED ORDER — HYDROMORPHONE HCL 1 MG/ML IJ SOLN
1.0000 mg | Freq: Once | INTRAMUSCULAR | Status: AC
  Administered 2019-12-21: 1 mg via INTRAVENOUS
  Filled 2019-12-21: qty 1

## 2019-12-21 MED ORDER — FENTANYL CITRATE (PF) 250 MCG/5ML IJ SOLN
INTRAMUSCULAR | Status: DC | PRN
  Administered 2019-12-21: 100 ug via INTRAVENOUS
  Administered 2019-12-21 (×3): 50 ug via INTRAVENOUS

## 2019-12-21 MED ORDER — FENTANYL CITRATE (PF) 250 MCG/5ML IJ SOLN
INTRAMUSCULAR | Status: AC
  Filled 2019-12-21: qty 5

## 2019-12-21 MED ORDER — HYDROMORPHONE HCL 1 MG/ML IJ SOLN
INTRAMUSCULAR | Status: AC
  Filled 2019-12-21: qty 1

## 2019-12-21 MED ORDER — OXYCODONE HCL 5 MG/5ML PO SOLN: 5.0000 mg | Freq: Once | ORAL | Status: DC | PRN

## 2019-12-21 MED ORDER — OXYCODONE HCL 5 MG PO TABS: 5.0000 mg | ORAL_TABLET | ORAL | Status: DC | PRN

## 2019-12-21 MED ORDER — DIPHENHYDRAMINE HCL 25 MG PO CAPS: 25.0000 mg | ORAL_CAPSULE | Freq: Four times a day (QID) | ORAL | Status: DC | PRN

## 2019-12-21 MED ORDER — ENOXAPARIN SODIUM 40 MG/0.4ML ~~LOC~~ SOLN: 40.0000 mg | SUBCUTANEOUS | Status: DC

## 2019-12-21 MED ORDER — HYDROMORPHONE HCL 1 MG/ML IJ SOLN
0.2500 mg | INTRAMUSCULAR | Status: DC | PRN
  Administered 2019-12-21: 0.5 mg via INTRAVENOUS
  Administered 2019-12-21: 0.25 mg via INTRAVENOUS
  Administered 2019-12-21: 0.5 mg via INTRAVENOUS

## 2019-12-21 MED ORDER — ACETAMINOPHEN 325 MG PO TABS: 650.0000 mg | ORAL_TABLET | Freq: Four times a day (QID) | ORAL | Status: DC | PRN

## 2019-12-21 MED ORDER — ONDANSETRON 4 MG PO TBDP: 4.0000 mg | ORAL_TABLET | Freq: Four times a day (QID) | ORAL | Status: DC | PRN

## 2019-12-21 MED ORDER — CHLORHEXIDINE GLUCONATE CLOTH 2 % EX PADS
6.0000 | MEDICATED_PAD | Freq: Once | CUTANEOUS | Status: AC
  Administered 2019-12-21: 6 via TOPICAL

## 2019-12-21 MED ORDER — ONDANSETRON HCL 4 MG/2ML IJ SOLN: 4.0000 mg | Freq: Four times a day (QID) | INTRAMUSCULAR | Status: DC | PRN

## 2019-12-21 MED ORDER — ONDANSETRON HCL 4 MG/2ML IJ SOLN
INTRAMUSCULAR | Status: DC | PRN
  Administered 2019-12-21: 4 mg via INTRAVENOUS

## 2019-12-21 MED ORDER — SODIUM CHLORIDE 0.9 % IV SOLN
INTRAVENOUS | Status: AC
  Filled 2019-12-21: qty 2

## 2019-12-21 MED ORDER — IOHEXOL 300 MG/ML  SOLN
100.0000 mL | Freq: Once | INTRAMUSCULAR | Status: AC | PRN
  Administered 2019-12-21: 100 mL via INTRAVENOUS

## 2019-12-21 MED ORDER — ALBUMIN HUMAN 5 % IV SOLN: INTRAVENOUS | Status: DC | PRN

## 2019-12-21 MED ORDER — PROPOFOL 10 MG/ML IV BOLUS
INTRAVENOUS | Status: DC | PRN
  Administered 2019-12-21: 160 mg via INTRAVENOUS

## 2019-12-21 MED ORDER — PROPOFOL 10 MG/ML IV BOLUS
INTRAVENOUS | Status: AC
  Filled 2019-12-21: qty 20

## 2019-12-21 MED ORDER — MORPHINE SULFATE (PF) 2 MG/ML IV SOLN
2.0000 mg | INTRAVENOUS | Status: DC | PRN
  Administered 2019-12-21 – 2019-12-23 (×8): 2 mg via INTRAVENOUS
  Filled 2019-12-21 (×9): qty 1

## 2019-12-21 MED ORDER — ENOXAPARIN SODIUM 40 MG/0.4ML ~~LOC~~ SOLN
40.0000 mg | SUBCUTANEOUS | Status: DC
  Administered 2019-12-22 – 2019-12-26 (×5): 40 mg via SUBCUTANEOUS
  Filled 2019-12-21 (×5): qty 0.4

## 2019-12-21 MED ORDER — KETOROLAC TROMETHAMINE 30 MG/ML IJ SOLN: 30.0000 mg | Freq: Once | INTRAMUSCULAR | Status: DC | PRN

## 2019-12-21 MED ORDER — DEXMEDETOMIDINE HCL IN NACL 200 MCG/50ML IV SOLN
INTRAVENOUS | Status: DC | PRN
  Administered 2019-12-21: 4 ug via INTRAVENOUS

## 2019-12-21 MED ORDER — LIDOCAINE 2% (20 MG/ML) 5 ML SYRINGE
INTRAMUSCULAR | Status: AC
  Filled 2019-12-21: qty 5

## 2019-12-21 SURGICAL SUPPLY — 42 items
APPLICATOR COTTON TIP 6 STRL (MISCELLANEOUS) ×1 IMPLANT
APPLICATOR COTTON TIP 6IN STRL (MISCELLANEOUS) ×2
BLADE EXTENDED COATED 6.5IN (ELECTRODE) IMPLANT
BLADE HEX COATED 2.75 (ELECTRODE) ×2 IMPLANT
CHLORAPREP W/TINT 26 (MISCELLANEOUS) ×2 IMPLANT
COVER MAYO STAND STRL (DRAPES) IMPLANT
COVER SURGICAL LIGHT HANDLE (MISCELLANEOUS) ×2 IMPLANT
COVER WAND RF STERILE (DRAPES) IMPLANT
DRAPE LAPAROSCOPIC ABDOMINAL (DRAPES) ×2 IMPLANT
DRAPE WARM FLUID 44X44 (DRAPES) IMPLANT
DRSG PAD ABDOMINAL 8X10 ST (GAUZE/BANDAGES/DRESSINGS) ×2 IMPLANT
ELECT REM PT RETURN 15FT ADLT (MISCELLANEOUS) ×2 IMPLANT
GAUZE SPONGE 4X4 12PLY STRL (GAUZE/BANDAGES/DRESSINGS) ×2 IMPLANT
GLOVE BIOGEL PI IND STRL 7.0 (GLOVE) ×1 IMPLANT
GLOVE BIOGEL PI INDICATOR 7.0 (GLOVE) ×1
GLOVE SURG ORTHO 8.0 STRL STRW (GLOVE) ×2 IMPLANT
GOWN STRL REUS W/TWL LRG LVL3 (GOWN DISPOSABLE) ×2 IMPLANT
GOWN STRL REUS W/TWL XL LVL3 (GOWN DISPOSABLE) ×4 IMPLANT
HANDLE SUCTION POOLE (INSTRUMENTS) IMPLANT
KIT BASIN OR (CUSTOM PROCEDURE TRAY) ×2 IMPLANT
KIT TURNOVER KIT A (KITS) IMPLANT
LIGASURE IMPACT 36 18CM CVD LR (INSTRUMENTS) ×2 IMPLANT
NS IRRIG 1000ML POUR BTL (IV SOLUTION) ×2 IMPLANT
PACK GENERAL/GYN (CUSTOM PROCEDURE TRAY) ×2 IMPLANT
PENCIL SMOKE EVACUATOR (MISCELLANEOUS) IMPLANT
SPONGE LAP 18X18 RF (DISPOSABLE) IMPLANT
STAPLER VISISTAT 35W (STAPLE) ×2 IMPLANT
SUCTION POOLE HANDLE (INSTRUMENTS)
SUT NOV 1 T60/GS (SUTURE) IMPLANT
SUT PROLENE 2 0 BLUE (SUTURE) ×2 IMPLANT
SUT SILK 2 0 (SUTURE)
SUT SILK 2 0 SH CR/8 (SUTURE) IMPLANT
SUT SILK 2-0 18XBRD TIE 12 (SUTURE) IMPLANT
SUT SILK 3 0 (SUTURE)
SUT SILK 3 0 SH CR/8 (SUTURE) IMPLANT
SUT SILK 3-0 18XBRD TIE 12 (SUTURE) IMPLANT
SUT VICRYL 2 0 18  UND BR (SUTURE)
SUT VICRYL 2 0 18 UND BR (SUTURE) IMPLANT
TOWEL OR 17X26 10 PK STRL BLUE (TOWEL DISPOSABLE) ×4 IMPLANT
TRAY FOLEY MTR SLVR 16FR STAT (SET/KITS/TRAYS/PACK) IMPLANT
WATER STERILE IRR 1000ML POUR (IV SOLUTION) ×2 IMPLANT
YANKAUER SUCT BULB TIP NO VENT (SUCTIONS) IMPLANT

## 2019-12-21 NOTE — Anesthesia Postprocedure Evaluation (Signed)
Anesthesia Post Note  Patient: Joanna Yang  Procedure(s) Performed: EXPLORATORY LAPAROTOMY; RESECTION OF DESCENDING AND SIGMOID COLON AND TRANSVERSE COLOSTOMY (N/A )     Patient location during evaluation: PACU Anesthesia Type: General Level of consciousness: awake and alert Pain management: pain level controlled Vital Signs Assessment: post-procedure vital signs reviewed and stable Respiratory status: spontaneous breathing, nonlabored ventilation, respiratory function stable and patient connected to nasal cannula oxygen Cardiovascular status: blood pressure returned to baseline and stable Postop Assessment: no apparent nausea or vomiting Anesthetic complications: no   No complications documented.  Last Vitals:  Vitals:   12/21/19 1800 12/21/19 1815  BP: 101/74 97/73  Pulse: 84 81  Resp: 19 11  Temp:    SpO2: 93% 92%    Last Pain:  Vitals:   12/21/19 1815  TempSrc:   PainSc: [place]

## 2019-12-21 NOTE — ED Notes (Signed)
Patient going to short stay

## 2019-12-21 NOTE — Interval H&P Note (Signed)
History and Physical Interval Note:  12/21/2019 3:23 PM  Joanna Yang  has presented today for surgery, with the diagnosis of BOWEL OBSTRUCTION.  The various methods of treatment have been discussed with the patient and family. After consideration of risks, benefits and other options for treatment, the patient has consented to    Procedure(s): EXPLORATORY LAPAROTOMY; POSSIBLE BOWEL RESECTION; POSSIBLE OSTOMY (N/A) as a surgical intervention.    The patient's history has been reviewed, patient examined, no change in status, stable for surgery.  I have reviewed the patient's chart and labs.  Questions were answered to the patient's satisfaction.    Armandina Gemma, MD [facility] Office: [place]

## 2019-12-21 NOTE — Anesthesia Preprocedure Evaluation (Addendum)
Anesthesia Evaluation  Patient identified by MRN, date of birth, ID band Patient awake    Reviewed: Allergy & Precautions, NPO status , Patient's Chart, lab work & pertinent test results  History of Anesthesia Complications (+) PONV  Airway Mallampati: II  TM Distance: >3 FB Neck ROM: Full    Dental no notable dental hx. (+) Teeth Intact, Dental Advisory Given   Pulmonary neg pulmonary ROS,    Pulmonary exam normal breath sounds clear to auscultation       Cardiovascular negative cardio ROS Normal cardiovascular exam Rhythm:Regular Rate:Normal  08/2016 Stress Echo Baseline ECG: Normal sinus rhythm.  - Stress ECG conclusions: Sinus tachycardia with 2-3 mm horizontal  to upsloping ST segment depression in II, III, AVF and V3-V6 at  peak stress.  - Recovery: LVEF 60-65%, normal wall motion and thickening.  - Peak stress: Expected hyperdynamic increase in LV function with  LVEF of 75-80%, normal wall motion and thickening.  - Baseline: Normal LVEF of 55-60%, normal wall motion and  thickening.   Impressions:   - Electrically abnormal treadmill stress electrocardiogram  suggesetive of ischemia, however, normal stress echocardiographic  images. Fair exercise tolerance with normal functional capacity.  No chest pain was reported. Findings are equivocal. Additional  perfusion testing may be warranted.    Neuro/Psych Anxiety negative neurological ROS     GI/Hepatic negative GI ROS, Neg liver ROS,   Endo/Other  negative endocrine ROS  Renal/GU negative Renal ROSK+ 3.9 Cr 0.83   S/P Hysterectomy of 10/25    Musculoskeletal negative musculoskeletal ROS (+)   Abdominal   Peds  Hematology Hgb 14.9 Plt 216   Anesthesia Other Findings   Reproductive/Obstetrics negative OB ROS                             Anesthesia Physical Anesthesia Plan  ASA: II and emergent  Anesthesia  Plan: General   Post-op Pain Management:    Induction: Intravenous, Rapid sequence and Cricoid pressure planned  PONV Risk Score and Plan: 4 or greater and Treatment may vary due to age or medical condition, Dexamethasone, Ondansetron, Scopolamine patch - Pre-op and Midazolam  Airway Management Planned: Oral ETT  Additional Equipment: None  Intra-op Plan:   Post-operative Plan: Extubation in OR  Informed Consent: I have reviewed the patients History and Physical, chart, labs and discussed the procedure including the risks, benefits and alternatives for the proposed anesthesia with the patient or authorized representative who has indicated his/her understanding and acceptance.     Dental advisory given  Plan Discussed with: CRNA and Anesthesiologist  Anesthesia Plan Comments:        Anesthesia Quick Evaluation

## 2019-12-21 NOTE — Transfer of Care (Signed)
Immediate Anesthesia Transfer of Care Note  Patient: Joanna Yang  Procedure(s) Performed: EXPLORATORY LAPAROTOMY; RESECTION OF DESCENDING AND SIGMOID COLON AND TRANSVERSE COLOSTOMY (N/A )  Patient Location: PACU  Anesthesia Type:General  Level of Consciousness: awake, alert  and oriented  Airway & Oxygen Therapy: Patient Spontanous Breathing and Patient connected to face mask oxygen  Post-op Assessment: Report given to RN and Post -op Vital signs reviewed and stable  Post vital signs: Reviewed and stable  Last Vitals:  Vitals Value Taken Time  BP 137/74 12/21/19 1740  Temp    Pulse 96 12/21/19 1745  Resp 17 12/21/19 1745  SpO2 93 % 12/21/19 1745  Vitals shown include unvalidated device data.  Last Pain:  Vitals:   12/21/19 1451  TempSrc: [place]  PainSc:          Complications: No complications documented.

## 2019-12-21 NOTE — H&P (Signed)
[facility] Admission Note  Joanna Yang 01-08-1971  [id number].    Requesting MD: Sherwood Gambler Chief Complaint/Reason for Consult: internal hernia  HPI:  Joanna Yang is a 49yo female PMH anxiety who underwent laparoscopic hysterectomy 12/18/19 by Dr. [name], who was sent to the ED today from her office for evaluation of abdominal distension. The patient states that she has had intermittent abdominal distension for about 12 years. She takes miralax daily but if she misses a dose she will become distended. Typically the distension will resolve with diet adjustments and miralax. After her surgery 3 days ago the distension has been gradually worse. Some mild nausea, no emesis. Last BM 3 days ago after a suppository. Passing minimal flatus. States that the distension has never been this severe or prolonged before.  In the ED she underwent CT scan which showed a closed loop obstruction of the colon related to an internal herniation of both colon and small bowel through the transverse mesocolon. WBC 6.3, VSS. Covid test is negative. General surgery asked to see.  Last PO intake 4oz water around 1000 this AM, and crackers around 0300.  Abdominal surgical history: c section, laparoscopic hysterectomy Last colonoscopy 12/2018 by Dr. Collene Mares Anticoagulants: none Nonsmoker  Review of Systems  Constitutional: Negative.   HENT: Negative.   Respiratory: Negative.   Cardiovascular: Negative.   Gastrointestinal: Positive for abdominal pain, constipation and nausea. Negative for vomiting.  Genitourinary: Negative.   Musculoskeletal: Negative.   Skin: Negative.   Neurological: Negative.    All systems reviewed and otherwise negative except for as above  Family History  Problem Relation Age of Onset  . Hypertension Mother   . Arrhythmia Mother   . Depression Mother   . Heart disease Mother   . Heart failure Mother   . Diabetes Mother   . Hypertension Father   . Cancer Paternal  Grandmother        tumor in spine  . Dementia Paternal Grandfather   . Breast cancer Sister 40    Past Medical History:  Diagnosis Date  . Anemia   . Anxiety   . Complication of anesthesia   . Endometrial polyp 03/22/2019  . Family history of breast cancer   . Fibroid   . Heart murmur    pt. thinks she has a benign murmur dx'd during pregnancy  . Mitral regurgitation    a. Prior 2D echo in 2014 showed technically limited study, EF 60%, mild MR, trace TR.  Marland Kitchen Pneumonia   . PONV (postoperative nausea and vomiting)   . Right ovarian cyst 07/29/2019    Past Surgical History:  Procedure Laterality Date  . CESAREAN SECTION  2009  . COLONOSCOPY    . CYSTOSCOPY N/A 12/18/2019   Procedure: CYSTOSCOPY;  Surgeon: Nunzio Cobbs, MD;  Location: [facility];  Service: Gynecology;  Laterality: N/A;  . ESOPHAGOGASTRODUODENOSCOPY ENDOSCOPY    . IUD REMOVAL  07/29/2019   expelling IUD  . TOTAL LAPAROSCOPIC HYSTERECTOMY WITH SALPINGECTOMY N/A 12/18/2019   Procedure: TOTAL LAPAROSCOPIC HYSTERECTOMY WITH SALPINGECTOMY and lysis of adhesions;  Surgeon: Nunzio Cobbs, MD;  Location: [facility];  Service: Gynecology;  Laterality: N/A;  . TUBAL LIGATION  2009    Social History:  reports that she has never smoked. She has never used smokeless tobacco. She reports that she does not drink alcohol and does not use drugs.  Allergies:  Allergies  Allergen Reactions  . Sulfa Antibiotics Swelling  .  Sulfamethoxazole-Trimethoprim Swelling    (Not in a hospital admission)   Prior to Admission medications   Medication Sig Start Date End Date Taking? Authorizing Provider  acetaminophen (TYLENOL) 325 MG tablet Take 650 mg by mouth every 6 (six) hours as needed for mild pain, fever or headache.   Yes [provider]  FLUoxetine (PROZAC) 20 MG capsule Take 40 mg by mouth daily.  09/25/16  Yes [provider]  ibuprofen (ADVIL) 800 MG  tablet Take 1 tablet (800 mg total) by mouth every 8 (eight) hours as needed for mild pain or moderate pain. 12/19/19  Yes Amundson Raliegh Ip, MD  polyethylene glycol [facility] / GLYCOLAX) packet Take 8.5 g by mouth daily. Takes 1/2 capful daily    Yes [provider]  simethicone (MYLICON) 80 MG chewable tablet Chew 1 tablet (80 mg total) by mouth 4 (four) times daily as needed for flatulence. 12/19/19  Yes Nunzio Cobbs, MD  oxyCODONE-acetaminophen (PERCOCET/ROXICET) 5-325 MG tablet Take 1-2 tablets by mouth every 4 (four) hours as needed for moderate pain. Patient not taking: Reported on 12/21/2019 12/19/19   Nunzio Cobbs, MD    Blood pressure 114/84, pulse 87, temperature 98.2 F (36.8 C), temperature source Oral, resp. rate 18, height 5\' 5"  (1.651 m), weight 75 kg, last menstrual period 08/14/2019, SpO2 97 %. Physical Exam: General: pleasant, WD/WN female who is laying in bed in NAD HEENT: head is normocephalic, atraumatic.  Sclera are noninjected.  PERRL.  Ears and nose without any masses or lesions.  Mouth is pink and moist. Dentition fair Heart: regular, rate, and rhythm.  Normal s1,s2. No obvious murmurs, gallops, or rubs noted.  Palpable pedal pulses bilaterally  Lungs: CTAB, no wheezes, rhonchi, or rales noted.  Respiratory effort nonlabored Abd: healing laparoscopic incisions, significantly distended and tympanic, mild diffuse TTP without rebound or guarding, no peritonitis, few high pitched bowel sounds, no HSM or hernia MS: no BUE/BLE edema, calves soft and nontender Skin: warm and dry with no masses, lesions, or rashes Psych: A&Ox4 with an appropriate affect Neuro: cranial nerves grossly intact, equal strength in BUE/BLE bilaterally, normal speech, thought process intact  Results for orders placed or performed during the hospital encounter of 12/21/19 (from the past 48 hour(s))  Comprehensive metabolic panel     Status: Abnormal    Collection Time: 12/21/19 11:20 AM  Result Value Ref Range   Sodium 137 135 - 145 mmol/L   Potassium 3.9 3.5 - 5.1 mmol/L   Chloride 104 98 - 111 mmol/L   CO2 23 22 - 32 mmol/L   Glucose, Bld 96 70 - 99 mg/dL    Comment: Glucose reference range applies only to samples taken after fasting for at least 8 hours.   BUN 10 6 - 20 mg/dL   Creatinine, Ser 0.83 0.44 - 1.00 mg/dL   Calcium 8.9 8.9 - 10.3 mg/dL   Total Protein 7.3 6.5 - 8.1 g/dL   Albumin 4.0 3.5 - 5.0 g/dL   AST 21 15 - 41 U/L   ALT 29 0 - 44 U/L   Alkaline Phosphatase 35 (L) 38 - 126 U/L   Total Bilirubin 1.0 0.3 - 1.2 mg/dL   GFR, Estimated >60 >60 mL/min    Comment: (NOTE) Calculated using the CKD-EPI Creatinine Equation (2021)    Anion gap 10 5 - 15    Comment: Performed at [facility], [place] [address] [place], [place] [address]  CBC  with Differential     Status: None   Collection Time: 12/21/19 11:20 AM  Result Value Ref Range   WBC 6.3 4.0 - 10.5 K/uL   RBC 4.90 3.87 - 5.11 MIL/uL   Hemoglobin 14.9 12.0 - 15.0 g/dL   HCT 43.8 36 - 46 %   MCV 89.4 80.0 - 100.0 fL   MCH 30.4 26.0 - 34.0 pg   MCHC 34.0 30.0 - 36.0 g/dL   RDW 12.7 11.5 - 15.5 %   Platelets 216 150 - 400 K/uL   nRBC 0.0 0.0 - 0.2 %   Neutrophils Relative % 79 %   Neutro Abs 4.9 1.7 - 7.7 K/uL   Lymphocytes Relative 13 %   Lymphs Abs 0.8 0.7 - 4.0 K/uL   Monocytes Relative 7 %   Monocytes Absolute 0.5 0.1 - 1.0 K/uL   Eosinophils Relative 1 %   Eosinophils Absolute 0.0 0.0 - 0.5 K/uL   Basophils Relative 0 %   Basophils Absolute 0.0 0.0 - 0.1 K/uL   Immature Granulocytes 0 %   Abs Immature Granulocytes 0.02 0.00 - 0.07 K/uL    Comment: Performed at [facility], [place] [address] [place], [place] [address]  Respiratory Panel by RT PCR (Flu A&B, Covid) - Nasopharyngeal Swab     Status: None   Collection Time: 12/21/19 11:25 AM   Specimen: Nasopharyngeal Swab  Result Value Ref Range   SARS Coronavirus 2  by RT PCR NEGATIVE NEGATIVE    Comment: (NOTE) SARS-CoV-2 target nucleic acids are NOT DETECTED.  The SARS-CoV-2 RNA is generally detectable in upper respiratoy specimens during the acute phase of infection. The lowest concentration of SARS-CoV-2 viral copies this assay can detect is 131 copies/mL. A negative result does not preclude SARS-Cov-2 infection and should not be used as the sole basis for treatment or other patient management decisions. A negative result may occur with  improper specimen collection/handling, submission of specimen other than nasopharyngeal swab, presence of viral mutation(s) within the areas targeted by this assay, and inadequate number of viral copies (<131 copies/mL). A negative result must be combined with clinical observations, patient history, and epidemiological information. The expected result is Negative.  Fact Sheet for Patients:  PinkCheek.be  Fact Sheet for Healthcare Providers:  GravelBags.it  This test is no t yet approved or cleared by the [place] FDA and  has been authorized for detection and/or diagnosis of SARS-CoV-2 by FDA under an Emergency Use Authorization (EUA). This EUA will remain  in effect (meaning this test can be used) for the duration of the COVID-19 declaration under Section 564(b)(1) of the Act, 21 U.S.C. section 360bbb-3(b)(1), unless the authorization is terminated or revoked sooner.     Influenza A by PCR NEGATIVE NEGATIVE   Influenza B by PCR NEGATIVE NEGATIVE    Comment: (NOTE) The Xpert Xpress SARS-CoV-2/FLU/RSV assay is intended as an aid in  the diagnosis of influenza from Nasopharyngeal swab specimens and  should not be used as a sole basis for treatment. Nasal washings and  aspirates are unacceptable for Xpert Xpress SARS-CoV-2/FLU/RSV  testing.  Fact Sheet for Patients: PinkCheek.be  Fact Sheet for Healthcare  Providers: GravelBags.it  This test is not yet approved or cleared by the [place] FDA and  has been authorized for detection and/or diagnosis of SARS-CoV-2 by  FDA under an Emergency Use Authorization (EUA). This EUA will remain  in effect (meaning this test can be used) for the duration of the  Covid-19  declaration under Section 564(b)(1) of the Act, 21  U.S.C. section 360bbb-3(b)(1), unless the authorization is  terminated or revoked. Performed at [facility], [place] [address] [place], [place] [address]    CT ABDOMEN PELVIS W CONTRAST  Result Date: 12/21/2019 CLINICAL DATA:  49 year old female with history of abdominal pain status post laparoscopic hysterectomy. Evaluate for bowel obstruction. EXAM: CT ABDOMEN AND PELVIS WITH CONTRAST TECHNIQUE: Multidetector CT imaging of the abdomen and pelvis was performed using the standard protocol following bolus administration of intravenous contrast. CONTRAST:  192mL OMNIPAQUE IOHEXOL 300 MG/ML  SOLN COMPARISON:  No priors. FINDINGS: Lower chest: Elevation of the left hemidiaphragm. Areas of linear scarring in the lower lobes of the lungs bilaterally. Trace bilateral pleural effusions (right greater than left). Hepatobiliary: No suspicious cystic or solid hepatic lesions. No intra or extrahepatic biliary ductal dilatation. Gallbladder is nearly decompressed, but otherwise unremarkable in appearance. Pancreas: No pancreatic mass. No pancreatic ductal dilatation. No pancreatic or peripancreatic fluid collections or inflammatory changes. Spleen: Unremarkable. Adrenals/Urinary Tract: Bilateral kidneys and adrenal glands are normal in appearance. No hydroureteronephrosis. Small amount of gas adjacent to the anterior wall of the urinary bladder. Urinary bladder is otherwise unremarkable in appearance. Stomach/Bowel: Normal appearance of the stomach. No pathologic dilatation of small bowel. Extensive gaseous  distension throughout the abnormal position of the small bowel which is located superior and anterior to the transverse colon, indicating herniation within a defect in the transverse mesocolon. There also appears to be herniation of the sigmoid colon and a portion of the descending colon through this defect in the transverse mesocolon. This is well demonstrated on axial image 56 of series 2 and coronal image 95 of series 5. The sigmoid colon and portion of the descending colon are massively distended with gas. Stool is present in the cecum, ascending colon and transverse colon, as well as in the distal rectum, but the proximal rectum and distal sigmoid colon are completely decompressed with obliterated lumen as it transitions through the internal hernia. Vascular/Lymphatic: No significant atherosclerotic disease, aneurysm or dissection noted in the abdominal or pelvic vasculature. No lymphadenopathy noted in the abdomen or pelvis. Reproductive: Status post hysterectomy. Ovaries are unremarkable in appearance. Other: Trace volume of free fluid. No large volume of ascites. Small amount of residual pneumoperitoneum. Small amount of gas in the space of Retzius. Small amount of gas along the right pelvic sidewall. Musculoskeletal: Gas in the anterior abdominal wall musculature and subcutaneous fat. There are no aggressive appearing lytic or blastic lesions noted in the visualized portions of the skeleton. IMPRESSION: 1. Postoperative changes of recent laparoscopic hysterectomy with what appears to be a close loop obstruction of the colon related to an internal herniation of both colon and small bowel through the transverse mesocolon. At this time, there is no associated small bowel obstruction. Surgical consultation is strongly recommended. 2. Trace bilateral pleural effusions. Critical Value/emergent results were called by telephone at the time of interpretation on 12/21/2019 at 12:41 pm to provider Sherwood Gambler, who  verbally acknowledged these results. Electronically Signed   By: Vinnie Langton M.D.   On: 12/21/2019 12:46      Assessment/Plan Anxiety Hx laparoscopic hysterectomy 12/18/19 by Dr. [name]  Closed loop bowel obstruction - Patient is 3 days s/p laparoscopic hysterectomy and found to have an internal hernia of small and large bowel with closed loop obstruction of the colon. Unsure if this is related to her recent surgery, or possibly her h/o intermittent abdominal distension and possible  chronic intermittent obstruction. Plan to take to the OR today for exploratory laparotomy, possible bowel resection, possible ostomy. Covid test is negative. Keep NPO. She will be admitted inpatient postoperatively. Case discussed with Dr. [name] who plans to assist with the surgery.   Wellington Hampshire, [facility] 12/21/2019, 2:01 PM Please see Amion for pager number during day hours 7:00am-4:30pm

## 2019-12-21 NOTE — ED Notes (Signed)
Pt ambulatory to restroom with husband

## 2019-12-21 NOTE — Telephone Encounter (Signed)
Call transferred from front office. Patient crying, requesting earlier appt. Patient is post op. Reports increased pain in her left pelvis, abdomen and into right ribs. Patient reports pain has been minimal since surgery, only taking ibuprofen 800 mg q8hrs. Reports she is voiding. Denies N/V, fever/chills.  Only passing small amounts of gas, no BM. Reports pain is "12 out of 10". She has tried warm fluids, hot showers and mylicon with no relief. Patient is requesting earlier appt, but can not come in before 9am. OV r/s to 9:45am with Dr. [name]. Patient is requesting that her husband accompany her to her appt, advised this is ok, advised he would need to wear a mask. Patient verbalizes understanding and is agreeable.   Routing to Dr. Antony Blackbird.   Encounter closed.

## 2019-12-21 NOTE — ED Triage Notes (Signed)
Pt sent from OBGYN by ems due to 3 day s/p total lap hysterectomy. Pt complaining of abd distention and firmness, nausea, and light headed. Patient states no bm/gas since Tuesday.  Denies narcotic intake.

## 2019-12-21 NOTE — Op Note (Signed)
Operative Note  Pre-operative Diagnosis:  Colonic obstruction, internal hernia  Post-operative Diagnosis:  megacolon  Surgeon:  Armandina Gemma, MD  Assistant:  Rolly Pancake, MD   Procedure:  Exploratory laparotomy, resection of descending and sigmoid colon, end transverse colostomy  Anesthesia:  general  Estimated Blood Loss:  minimal  Drains: none         Specimen: descending and sigmoid colon to pathology  Indications: Patient is a 49 year old female who had undergone hysterectomy 3 days prior to presentation.  Patient had developed extensive abdominal distention and discomfort.  Evaluation in the emergency department included a CT scan which showed evidence of an internal hernia with herniation of the small bowel and a significant portion of the colon with marked colonic distention.  Patient had a history of chronic constipation and intermittent abdominal distention dating back many years.  Patient was seen and evaluated and prepared urgently for the operating room.  Dr. [name], the patient's gynecologist who performed a hysterectomy earlier in the week, participated in the entire procedure.  Procedure:  The patient was seen in the pre-op holding area. The risks, benefits, complications, treatment options, and expected outcomes were previously discussed with the patient. The patient agreed with the proposed plan and has signed the informed consent form.  The patient was brought to the operating room by the surgical team, identified as [name] and the procedure verified. A "time out" was completed and the above information confirmed.  Following induction of general anesthesia, the patient is positioned and then prepped and draped in the usual aseptic fashion.  After ascertaining that an adequate level of anesthesia been achieved, an upper midline abdominal incision is made with a #10 blade.  Dissection was carried through the subcutaneous tissues and the fascia is incised in the midline  and the peritoneal cavity is entered cautiously.  There is marked distention of the colon.  It is quite thin-walled and translucent.  There is a small amount of clear ascitic fluid.  With gentle manipulation the entire colon is delivered through the incision.  The cecum appears relatively normal with a normal appendix.  The ascending colon is normal to mildly dilated.  Beginning at the proximal transverse colon there is significant dilatation of the transverse colon which becomes more pronounced in the proximal descending colon.  The descending colon and sigmoid colon are markedly dilated, thin-walled, translucent, and atonic.  At the distal descending colon the bowel narrows down to a more normal caliber appears normal below the peritoneal reflection and down the anterior wall of the rectum.  On palpation there are no masses and there is no obvious point of obstruction.  Small intestine has fallen into a recess in the retroperitoneum.  There is no mesenteric defect.  Small bowel is delivered out of the retroperitoneum and run from the ligament of Treitz to the ileocecal valve.  It is grossly normal.  The stomach is normal to palpation and a nasogastric tube is properly positioned.  At this point Dr. Leighton Ruff and Dr. Neysa Bonito from colorectal surgery came to the operating room and reviewed the operative findings and clinical history.  They made a recommendation for proceeding with descending colectomy and sigmoid colectomy bringing out an end transverse colostomy.  Diagnosis is megacolon.  Therefore the colon is transected at the splenic flexure with a GIA stapler.  Colon is also transected in the distal sigmoid colon with a GIA stapler.  Mesentery is divided using the LigaSure.  Larger vessels are divided between  Kelly clamps and ligated with 2-0 silk ties.  Descending and sigmoid colon is removed in its entirety and submitted to pathology for review.  Good hemostasis is noted along the mesenteric  resection line.  Abdomen is irrigated with warm saline which is evacuated.  Good hemostasis is noted throughout.  A point in the left mid abdomen is selected for the colostomy.  An ellipse of skin is excised and the underlying adipose tissue is excised.  A cruciate incision is made into the abdominal wall.  The end of the transverse colon is delivered through the abdominal wall with a Babcock clamp.  Bowel is secured to the peritoneum circumferentially with interrupted 2-0 silk sutures.  The distal sigmoid colon staple line is sutured to the upper lateral pelvis peritoneum with interrupted 2-0 Prolene sutures and the tails are left long for future identification of the staple line.  Midline incision is closed with running #1 PDS suture.  Subcutaneous tissues are irrigated.  Hemostasis is achieved with the electrocautery.  Subcutaneous tissues are packed with Betadine soaked 4 x 4 gauze sponges.  Colostomy is matured by excising the staple line.  Bowel is secured to the skin edges circumferentially with interrupted 3-0 Vicryl sutures.  Dressings are applied.  Ostomy bag is applied.  Patient is awakened from anesthesia and transported to the recovery room.  The patient tolerated the procedure well.   Armandina Gemma, MD [facility] Office: [phone]

## 2019-12-21 NOTE — Anesthesia Procedure Notes (Signed)
Procedure Name: Intubation Date/Time: 12/21/2019 3:51 PM Performed by: Talisha Erby D, CRNA Pre-anesthesia Checklist: Patient identified, Emergency Drugs available, Suction available and Patient being monitored Patient Re-evaluated:Patient Re-evaluated prior to induction Oxygen Delivery Method: Circle system utilized Preoxygenation: Pre-oxygenation with 100% oxygen Induction Type: IV induction, Rapid sequence and Cricoid Pressure applied Laryngoscope Size: Mac and 4 Grade View: Grade I Tube type: Oral Tube size: 7.5 mm Number of attempts: 1 Airway Equipment and Method: Stylet Placement Confirmation: ETT inserted through vocal cords under direct vision,  positive ETCO2 and breath sounds checked- equal and bilateral Secured at: 21 cm Tube secured with: Tape Dental Injury: Teeth and Oropharynx as per pre-operative assessment

## 2019-12-21 NOTE — ED Provider Notes (Signed)
[facility] DEPT Provider Note   CSN: [id number] Arrival date & time: 12/21/19  1057     History Chief Complaint  Patient presents with  . Abdominal Pain    Abimbola Aki is a 49 y.o. female.  HPI 49 year old female presents with abdominal distention and pain.  3 days ago she had laparoscopic hysterectomy.  Since then has had progressive abdominal distention and bloating/pain.  No bowel movements and minimal flatus.  No vomiting but sometimes nauseated when the pain becomes severe, which comes in waves.  No fevers.  No shortness of breath but it is hard to get a full breath.  Sent over via EMS from gynecology due to presumed ileus.  Patient has been taking ibuprofen and Tylenol for pain. Some pain in her left shoulder.   Past Medical History:  Diagnosis Date  . Anemia   . Anxiety   . Complication of anesthesia   . Endometrial polyp 03/22/2019  . Family history of breast cancer   . Fibroid   . Heart murmur    pt. thinks she has a benign murmur dx'd during pregnancy  . Mitral regurgitation    a. Prior 2D echo in 2014 showed technically limited study, EF 60%, mild MR, trace TR.  Marland Kitchen Pneumonia   . PONV (postoperative nausea and vomiting)   . Right ovarian cyst 07/29/2019    Patient Active Problem List   Diagnosis Date Noted  . Internal hernia 12/21/2019  . Colonic obstruction ([place]) 12/21/2019  . Status post surgery 12/18/2019  . Status post laparoscopic hysterectomy 12/18/2019  . Genetic testing 10/07/2019  . Family history of breast cancer   . Discomfort in chest 10/29/2016  . Chronic fatigue 10/29/2016  . Allergic rhinitis due to allergen 10/29/2016  . Vitamin D deficiency 10/29/2016    Past Surgical History:  Procedure Laterality Date  . CESAREAN SECTION  2009  . COLONOSCOPY    . CYSTOSCOPY N/A 12/18/2019   Procedure: CYSTOSCOPY;  Surgeon: Nunzio Cobbs, MD;  Location: [facility];  Service: Gynecology;   Laterality: N/A;  . ESOPHAGOGASTRODUODENOSCOPY ENDOSCOPY    . IUD REMOVAL  07/29/2019   expelling IUD  . TOTAL LAPAROSCOPIC HYSTERECTOMY WITH SALPINGECTOMY N/A 12/18/2019   Procedure: TOTAL LAPAROSCOPIC HYSTERECTOMY WITH SALPINGECTOMY and lysis of adhesions;  Surgeon: Nunzio Cobbs, MD;  Location: [facility];  Service: Gynecology;  Laterality: N/A;  . TUBAL LIGATION  2009     OB History    Gravida  3   Para  2   Term  2   Preterm      AB  1   Living  2     SAB  1   TAB      Ectopic      Multiple      Live Births  2           Family History  Problem Relation Age of Onset  . Hypertension Mother   . Arrhythmia Mother   . Depression Mother   . Heart disease Mother   . Heart failure Mother   . Diabetes Mother   . Hypertension Father   . Cancer Paternal Grandmother        tumor in spine  . Dementia Paternal Grandfather   . Breast cancer Sister 69    Social History   Tobacco Use  . Smoking status: Never Smoker  . Smokeless tobacco: Never Used  Vaping Use  . Vaping  Use: Never used  Substance Use Topics  . Alcohol use: No    Alcohol/week: 0.0 standard drinks  . Drug use: No    Home Medications Prior to Admission medications   Medication Sig Start Date End Date Taking? Authorizing Provider  acetaminophen (TYLENOL) 325 MG tablet Take 650 mg by mouth every 6 (six) hours as needed for mild pain, fever or headache.   Yes [provider]  FLUoxetine (PROZAC) 20 MG capsule Take 40 mg by mouth daily.  09/25/16  Yes [provider]  ibuprofen (ADVIL) 800 MG tablet Take 1 tablet (800 mg total) by mouth every 8 (eight) hours as needed for mild pain or moderate pain. 12/19/19  Yes Amundson Raliegh Ip, MD  polyethylene glycol [facility] / GLYCOLAX) packet Take 8.5 g by mouth daily. Takes 1/2 capful daily    Yes [provider]  simethicone (MYLICON) 80 MG chewable tablet Chew 1 tablet (80 mg total) by mouth 4  (four) times daily as needed for flatulence. 12/19/19  Yes Nunzio Cobbs, MD  oxyCODONE-acetaminophen (PERCOCET/ROXICET) 5-325 MG tablet Take 1-2 tablets by mouth every 4 (four) hours as needed for moderate pain. Patient not taking: Reported on 12/21/2019 12/19/19   Nunzio Cobbs, MD    Allergies    Sulfa antibiotics and Sulfamethoxazole-trimethoprim  Review of Systems   Review of Systems  Constitutional: Negative for fever.  Respiratory: Negative for shortness of breath.   Cardiovascular: Negative for chest pain.  Gastrointestinal: Positive for abdominal distention, abdominal pain, constipation and nausea. Negative for diarrhea and vomiting.  All other systems reviewed and are negative.   Physical Exam Updated Vital Signs BP 119/83   Pulse 73   Temp 98.1 F (36.7 C) (Oral)   Resp 16   Ht 5\' 5"  (1.651 m)   Wt 75 kg   LMP 08/14/2019 (Exact Date)   SpO2 94%   BMI 27.51 kg/m   Physical Exam Vitals and nursing note reviewed.  Constitutional:      Appearance: She is well-developed.  HENT:     Head: Normocephalic and atraumatic.     Right Ear: External ear normal.     Left Ear: External ear normal.     Nose: Nose normal.  Eyes:     General:        Right eye: No discharge.        Left eye: No discharge.  Cardiovascular:     Rate and Rhythm: Normal rate and regular rhythm.     Heart sounds: Normal heart sounds.  Pulmonary:     Effort: Pulmonary effort is normal.     Breath sounds: Normal breath sounds.  Abdominal:     General: Bowel sounds are decreased. There is distension.     Palpations: Abdomen is soft.     Tenderness: There is generalized abdominal tenderness (mild).     Comments: Laparoscopic wounds appear clean/dry/intact  Skin:    General: Skin is warm and dry.  Neurological:     Mental Status: She is alert.  Psychiatric:        Mood and Affect: Mood is not anxious.     ED Results / Procedures / Treatments   Labs (all labs  ordered are listed, but only abnormal results are displayed) Labs Reviewed  COMPREHENSIVE METABOLIC PANEL - Abnormal; Notable for the following components:      Result Value   Alkaline Phosphatase 35 (*)    All other components within normal  limits  RESPIRATORY PANEL BY RT PCR (FLU A&B, COVID)  CBC WITH DIFFERENTIAL/PLATELET    EKG None  Radiology CT ABDOMEN PELVIS W CONTRAST  Result Date: 12/21/2019 CLINICAL DATA:  49 year old female with history of abdominal pain status post laparoscopic hysterectomy. Evaluate for bowel obstruction. EXAM: CT ABDOMEN AND PELVIS WITH CONTRAST TECHNIQUE: Multidetector CT imaging of the abdomen and pelvis was performed using the standard protocol following bolus administration of intravenous contrast. CONTRAST:  180mL OMNIPAQUE IOHEXOL 300 MG/ML  SOLN COMPARISON:  No priors. FINDINGS: Lower chest: Elevation of the left hemidiaphragm. Areas of linear scarring in the lower lobes of the lungs bilaterally. Trace bilateral pleural effusions (right greater than left). Hepatobiliary: No suspicious cystic or solid hepatic lesions. No intra or extrahepatic biliary ductal dilatation. Gallbladder is nearly decompressed, but otherwise unremarkable in appearance. Pancreas: No pancreatic mass. No pancreatic ductal dilatation. No pancreatic or peripancreatic fluid collections or inflammatory changes. Spleen: Unremarkable. Adrenals/Urinary Tract: Bilateral kidneys and adrenal glands are normal in appearance. No hydroureteronephrosis. Small amount of gas adjacent to the anterior wall of the urinary bladder. Urinary bladder is otherwise unremarkable in appearance. Stomach/Bowel: Normal appearance of the stomach. No pathologic dilatation of small bowel. Extensive gaseous distension throughout the abnormal position of the small bowel which is located superior and anterior to the transverse colon, indicating herniation within a defect in the transverse mesocolon. There also appears to be  herniation of the sigmoid colon and a portion of the descending colon through this defect in the transverse mesocolon. This is well demonstrated on axial image 56 of series 2 and coronal image 95 of series 5. The sigmoid colon and portion of the descending colon are massively distended with gas. Stool is present in the cecum, ascending colon and transverse colon, as well as in the distal rectum, but the proximal rectum and distal sigmoid colon are completely decompressed with obliterated lumen as it transitions through the internal hernia. Vascular/Lymphatic: No significant atherosclerotic disease, aneurysm or dissection noted in the abdominal or pelvic vasculature. No lymphadenopathy noted in the abdomen or pelvis. Reproductive: Status post hysterectomy. Ovaries are unremarkable in appearance. Other: Trace volume of free fluid. No large volume of ascites. Small amount of residual pneumoperitoneum. Small amount of gas in the space of Retzius. Small amount of gas along the right pelvic sidewall. Musculoskeletal: Gas in the anterior abdominal wall musculature and subcutaneous fat. There are no aggressive appearing lytic or blastic lesions noted in the visualized portions of the skeleton. IMPRESSION: 1. Postoperative changes of recent laparoscopic hysterectomy with what appears to be a close loop obstruction of the colon related to an internal herniation of both colon and small bowel through the transverse mesocolon. At this time, there is no associated small bowel obstruction. Surgical consultation is strongly recommended. 2. Trace bilateral pleural effusions. Critical Value/emergent results were called by telephone at the time of interpretation on 12/21/2019 at 12:41 pm to provider Sherwood Gambler, who verbally acknowledged these results. Electronically Signed   By: Vinnie Langton M.D.   On: 12/21/2019 12:46    Procedures Procedures (including critical care time)  Medications Ordered in ED Medications   cefoTEtan (CEFOTAN) 2 g in sodium chloride 0.9 % 100 mL IVPB (has no administration in time range)  enoxaparin (LOVENOX) injection 40 mg ( Subcutaneous Automatically Held 12/29/19 1900)  metoprolol tartrate (LOPRESSOR) injection 5 mg ( Intravenous MAR Hold 12/21/19 1444)  pantoprazole (PROTONIX) injection 40 mg ( Intravenous Automatically Held 12/29/19 2200)  simethicone (MYLICON) chewable tablet 40 mg (  Oral MAR Hold 12/21/19 1444)  ondansetron (ZOFRAN-ODT) disintegrating tablet 4 mg ( Oral MAR Hold 12/21/19 1444)    Or  ondansetron (ZOFRAN) injection 4 mg ( Intravenous MAR Hold 12/21/19 1444)  polyethylene glycol (MIRALAX / GLYCOLAX) packet 17 g ( Oral MAR Hold 12/21/19 1444)  diphenhydrAMINE (BENADRYL) capsule 25 mg ( Oral MAR Hold 12/21/19 1444)    Or  diphenhydrAMINE (BENADRYL) injection 25 mg ( Intravenous MAR Hold 12/21/19 1444)  morphine 2 MG/ML injection 2 mg ( Intravenous MAR Hold 12/21/19 1444)  acetaminophen (TYLENOL) tablet 650 mg ( Oral MAR Hold 12/21/19 1444)    Or  acetaminophen (TYLENOL) suppository 650 mg ( Rectal MAR Hold 12/21/19 1444)  oxyCODONE (Oxy IR/ROXICODONE) immediate release tablet 5-10 mg ( Oral MAR Hold 12/21/19 1444)  0.9 %  sodium chloride infusion (has no administration in time range)  methocarbamol (ROBAXIN) 500 mg in dextrose 5 % 50 mL IVPB ( Intravenous MAR Hold 12/21/19 1444)  FLUoxetine (PROZAC) capsule 40 mg ( Oral Automatically Held 12/29/19 1000)  sodium chloride 0.9 % with cefoTEtan (CEFOTAN) ADS Med (has no administration in time range)  fentaNYL (SUBLIMAZE) injection 50 mcg (50 mcg Intravenous Given 12/21/19 1129)  lactated ringers bolus 1,000 mL (0 mLs Intravenous Stopped 12/21/19 1424)  iohexol (OMNIPAQUE) 300 MG/ML solution 100 mL (100 mLs Intravenous Contrast Given 12/21/19 1134)  HYDROmorphone (DILAUDID) injection 1 mg (1 mg Intravenous Given 12/21/19 1308)  Chlorhexidine Gluconate Cloth 2 % PADS 6 each (6 each Topical Given 12/21/19 1424)     ED Course  I have reviewed the triage vital signs and the nursing notes.  Pertinent labs & imaging results that were available during my care of the patient were reviewed by me and considered in my medical decision making (see chart for details).    MDM Rules/Calculators/A&P                          Patient CT scan was personally reviewed and discussed with the radiologist.  She has a closed loop obstruction.  I discussed with general surgery who will take to the OR.  I also discussed with the patient's gynecologist who is aware of the diagnosis and treatment plan. Kept NPO. Given IV fentanyl and dilaudid for pain. Final Clinical Impression(s) / ED Diagnoses Final diagnoses:  Internal hernia    Rx / DC Orders ED Discharge Orders    None       Sherwood Gambler, MD 12/21/19 1531

## 2019-12-21 NOTE — Progress Notes (Signed)
GYNECOLOGY  VISIT   HPI: 49 y.o.   Married  [place]  female   [phone] with Patient's last menstrual period was 08/14/2019 (exact date).   here for 3 day status post TOTAL LAPAROSCOPIC HYSTERECTOMY WITH SALPINGECTOMY and lysis of adhesions (N/A Uterus) CYSTOSCOPY (N/A ).  Patient had signs of ileus post op and had a bowel movement prior to discharge from the hospital. Her bowel was dilated at the time of her surgery, which was uncomplicated.   Patient feeling distended, nauseated and lightheaded. Her left should hurts when she takes a deep breath. Not passing gas and not having BMs.  Not having pain from surgery and no bleeding.  She has not been taking narcotic.  Husband is present with the patient today.  GYNECOLOGIC HISTORY: Patient's last menstrual period was 08/14/2019 (exact date). Contraception:  Tubal/Hyst Menopausal hormone therapy: none Last mammogram: 07-20-19 Bil.Br.MRI/Neg/density C/BiRads1 Last pap smear: 10-15-17 Neg:Neg HR HPV,04-20-14 Neg:Neg HR HPV                OB History    Gravida  3   Para  2   Term  2   Preterm      AB  1   Living  2     SAB  1   TAB      Ectopic      Multiple      Live Births  2              Patient Active Problem List   Diagnosis Date Noted  . Status post surgery 12/18/2019  . Status post laparoscopic hysterectomy 12/18/2019  . Genetic testing 10/07/2019  . Family history of breast cancer   . Discomfort in chest 10/29/2016  . Chronic fatigue 10/29/2016  . Allergic rhinitis due to allergen 10/29/2016  . Vitamin D deficiency 10/29/2016    Past Medical History:  Diagnosis Date  . Anemia   . Anxiety   . Complication of anesthesia   . Endometrial polyp 03/22/2019  . Family history of breast cancer   . Fibroid   . Heart murmur    pt. thinks she has a benign murmur dx'd during pregnancy  . Mitral regurgitation    a. Prior 2D echo in 2014 showed technically limited study, EF 60%, mild MR, trace TR.   Marland Kitchen Pneumonia   . PONV (postoperative nausea and vomiting)   . Right ovarian cyst 07/29/2019    Past Surgical History:  Procedure Laterality Date  . CESAREAN SECTION  2009  . COLONOSCOPY    . CYSTOSCOPY N/A 12/18/2019   Procedure: CYSTOSCOPY;  Surgeon: Nunzio Cobbs, MD;  Location: [facility];  Service: Gynecology;  Laterality: N/A;  . ESOPHAGOGASTRODUODENOSCOPY ENDOSCOPY    . IUD REMOVAL  07/29/2019   expelling IUD  . TOTAL LAPAROSCOPIC HYSTERECTOMY WITH SALPINGECTOMY N/A 12/18/2019   Procedure: TOTAL LAPAROSCOPIC HYSTERECTOMY WITH SALPINGECTOMY and lysis of adhesions;  Surgeon: Nunzio Cobbs, MD;  Location: [facility];  Service: Gynecology;  Laterality: N/A;  . TUBAL LIGATION  2009    Current Outpatient Medications  Medication Sig Dispense Refill  . ALPRAZolam (XANAX) 0.5 MG tablet Take 0.25-0.5 mg by mouth 3 (three) times daily as needed for anxiety.     . Ascorbic Acid (VITAMIN C) 1000 MG tablet Take 1,000 mg by mouth daily.     . cetirizine (ZYRTEC) 10 MG tablet Take 10 mg by mouth daily as needed for allergies.     Marland Kitchen  Cholecalciferol (VITAMIN D-3) 25 MCG (1000 UT) CAPS Take 1 capsule by mouth daily.     Marland Kitchen FLUoxetine (PROZAC) 20 MG capsule Take 40 mg by mouth daily.   0  . ibuprofen (ADVIL) 800 MG tablet Take 1 tablet (800 mg total) by mouth every 8 (eight) hours as needed for mild pain or moderate pain. 30 tablet 0  . polyethylene glycol (MIRALAX / GLYCOLAX) packet Take 8.5 g by mouth daily. Takes 1/2 capful daily     . simethicone (MYLICON) 80 MG chewable tablet Chew 1 tablet (80 mg total) by mouth 4 (four) times daily as needed for flatulence. 30 tablet 0  . oxyCODONE-acetaminophen (PERCOCET/ROXICET) 5-325 MG tablet Take 1-2 tablets by mouth every 4 (four) hours as needed for moderate pain. (Patient not taking: Reported on 12/21/2019) 20 tablet 0   No current facility-administered medications for this visit.      ALLERGIES: Sulfa antibiotics and Sulfamethoxazole-trimethoprim  Family History  Problem Relation Age of Onset  . Hypertension Mother   . Arrhythmia Mother   . Depression Mother   . Heart disease Mother   . Heart failure Mother   . Diabetes Mother   . Hypertension Father   . Cancer Paternal Grandmother        tumor in spine  . Dementia Paternal Grandfather   . Breast cancer Sister 72    Social History   Socioeconomic History  . Marital status: Married    Spouse name: Not on file  . Number of children: Not on file  . Years of education: Not on file  . Highest education level: Not on file  Occupational History  . Occupation: housewife  Tobacco Use  . Smoking status: Never Smoker  . Smokeless tobacco: Never Used  Vaping Use  . Vaping Use: Never used  Substance and Sexual Activity  . Alcohol use: No    Alcohol/week: 0.0 standard drinks  . Drug use: No  . Sexual activity: Yes    Partners: Male    Birth control/protection: Surgical    Comment: Tubal  Other Topics Concern  . Not on file  Social History Narrative   Lives with husband and children.    Housewife.    Social Determinants of Health   Financial Resource Strain:   . Difficulty of Paying Living Expenses: Not on file  Food Insecurity:   . Worried About Charity fundraiser in the Last Year: Not on file  . Ran Out of Food in the Last Year: Not on file  Transportation Needs:   . Lack of Transportation (Medical): Not on file  . Lack of Transportation (Non-Medical): Not on file  Physical Activity:   . Days of Exercise per Week: Not on file  . Minutes of Exercise per Session: Not on file  Stress:   . Feeling of Stress : Not on file  Social Connections:   . Frequency of Communication with Friends and Family: Not on file  . Frequency of Social Gatherings with Friends and Family: Not on file  . Attends Religious Services: Not on file  . Active Member of Clubs or Organizations: Not on file  . Attends Theatre manager Meetings: Not on file  . Marital Status: Not on file  Intimate Partner Violence:   . Fear of Current or Ex-Partner: Not on file  . Emotionally Abused: Not on file  . Physically Abused: Not on file  . Sexually Abused: Not on file    Review of Systems  Gastrointestinal:  Positive for nausea.  Neurological: Positive for light-headedness.  All other systems reviewed and are negative.   PHYSICAL EXAMINATION:    BP 118/78   Pulse 86   Temp 98.5 F (36.9 C) (Oral)   Ht 5' 5.5" (1.664 m)   Wt 166 lb (75.3 kg)   LMP 08/14/2019 (Exact Date)   SpO2 97%   BMI 27.20 kg/m     General appearance: tearful, appropriate conversation.  Head: Normocephalic, without obvious abnormality, atraumatic Neck: no adenopathy, supple, symmetrical, trachea midline and thyroid normal to inspection and palpation Lungs: clear to auscultation bilaterally Heart: regular rate and rhythm Abdomen: distended, high pitched bowel sounds mixed with normal bowel sound.  Tender.  Incisions intact.  Extremities: extremities normal, atraumatic, no cyanosis or edema  Pelvic:  Deferred.   ASSESSMENT  Post op ileus.   PLAN  Return to the hospital for care - ER evaluation.  I anticipated IVF, pain control, bowel rest, NG tube, dulcolax suppository.  The patient and husband agree with the plan.

## 2019-12-22 ENCOUNTER — Encounter (HOSPITAL_COMMUNITY): Payer: Self-pay | Admitting: Surgery

## 2019-12-22 LAB — BASIC METABOLIC PANEL
Anion gap: 10 (ref 5–15)
BUN: 10 mg/dL (ref 6–20)
CO2: 22 mmol/L (ref 22–32)
Calcium: 8.3 mg/dL — ABNORMAL LOW (ref 8.9–10.3)
Chloride: 101 mmol/L (ref 98–111)
Creatinine, Ser: 0.79 mg/dL (ref 0.44–1.00)
GFR, Estimated: >60 mL/min (ref >60)
Glucose, Bld: 138 mg/dL — ABNORMAL HIGH (ref 70–99)
Potassium: 4.1 mmol/L (ref 3.5–5.1)
Sodium: 133 mmol/L — ABNORMAL LOW (ref 135–145)

## 2019-12-22 LAB — CBC
HCT: 41.6 % (ref 36.0–46.0)
Hemoglobin: 14.2 g/dL (ref 12.0–15.0)
MCH: 31.1 pg (ref 26.0–34.0)
MCHC: 34.1 g/dL (ref 30.0–36.0)
MCV: 91 fL (ref 80.0–100.0)
Platelets: 191 10*3/uL (ref 150–400)
RBC: 4.57 MIL/uL (ref 3.87–5.11)
RDW: 12.7 % (ref 11.5–15.5)
WBC: 9.2 10*3/uL (ref 4.0–10.5)
nRBC: 0 % (ref 0.0–0.2)

## 2019-12-22 LAB — MAGNESIUM: Magnesium: 1.8 mg/dL (ref 1.7–2.4)

## 2019-12-22 LAB — PHOSPHORUS: Phosphorus: 3.2 mg/dL (ref 2.5–4.6)

## 2019-12-22 MED ORDER — METHOCARBAMOL 1000 MG/10ML IJ SOLN
500.0000 mg | Freq: Four times a day (QID) | INTRAVENOUS | Status: DC
  Administered 2019-12-22 – 2019-12-24 (×7): 500 mg via INTRAVENOUS
  Filled 2019-12-22 (×7): qty 500
  Filled 2019-12-22: qty 5

## 2019-12-22 MED ORDER — CHLORHEXIDINE GLUCONATE CLOTH 2 % EX PADS
6.0000 | MEDICATED_PAD | Freq: Every day | CUTANEOUS | Status: DC
  Administered 2019-12-22 – 2019-12-25 (×2): 6 via TOPICAL

## 2019-12-22 MED ORDER — ACETAMINOPHEN 10 MG/ML IV SOLN
1000.0000 mg | Freq: Four times a day (QID) | INTRAVENOUS | Status: AC
  Administered 2019-12-22 – 2019-12-23 (×3): 1000 mg via INTRAVENOUS
  Filled 2019-12-22 (×4): qty 100

## 2019-12-22 NOTE — Progress Notes (Signed)
[facility] Progress Note  1 Day Post-Op  Subjective: CC-  Abdomen sore but overall pain well controlled. Denies any n/v. Some sweat from ostomy but no flatus or stool. She has already been able to get OOB since surgery.  Objective: Vital signs in last 24 hours: Temp:  [97.7 F (36.5 C)-98.6 F (37 C)] 98.4 F (36.9 C) (10/29 0524) Pulse Rate:  [71-96] 71 (10/29 0524) Resp:  [11-22] 18 (10/29 0524) BP: (97-137)/(72-94) 109/82 (10/29 0524) SpO2:  [92 %-100 %] 100 % (10/29 0524) Weight:  [75 kg-75.3 kg] 75 kg (10/28 1101)    Intake/Output from previous day: 10/28 0701 - 10/29 0700 In: 3811 [P.O.:20; I.V.:2057.5; IV Piggyback:1733.5] Out: 1625 [YIFOY:7741; Blood:50] Intake/Output this shift: No intake/output data recorded.  PE: Gen:  Alert, NAD, pleasant HEENT: EOM's intact, pupils equal and round Card:  RRR Pulm:  CTAB, no W/R/R, rate and effort normal Abd: Soft, mild distension, appropriately tender, few BS heard, open midline incision pink without surrounding erythema or purulent drainage, ostomy viable with sweat in bag  Lab Results:  Recent Labs    12/21/19 1120 12/22/19 0412  WBC 6.3 9.2  HGB 14.9 14.2  HCT 43.8 41.6  PLT 216 191   BMET Recent Labs    12/21/19 1120 12/22/19 0412  NA 137 133*  K 3.9 4.1  CL 104 101  CO2 23 22  GLUCOSE 96 138*  BUN 10 10  CREATININE 0.83 0.79  CALCIUM 8.9 8.3*   PT/INR No results for input(s): LABPROT, INR in the last 72 hours. CMP     Component Value Date/Time   NA 133 (L) 12/22/2019 0412   NA 143 02/26/2016 1547   K 4.1 12/22/2019 0412   CL 101 12/22/2019 0412   CO2 22 12/22/2019 0412   GLUCOSE 138 (H) 12/22/2019 0412   BUN 10 12/22/2019 0412   BUN 9 02/26/2016 1547   CREATININE 0.79 12/22/2019 0412   CREATININE 0.72 01/10/2016 1701   CALCIUM 8.3 (L) 12/22/2019 0412   PROT 7.3 12/21/2019 1120   PROT 6.4 02/26/2016 1547   ALBUMIN 4.0 12/21/2019 1120   ALBUMIN 4.3 02/26/2016 1547   AST 21  12/21/2019 1120   ALT 29 12/21/2019 1120   ALKPHOS 35 (L) 12/21/2019 1120   BILITOT 1.0 12/21/2019 1120   BILITOT 0.3 02/26/2016 1547   GFRNONAA >60 12/22/2019 0412   GFRAA >60 07/28/2019 1937   Lipase     Component Value Date/Time   LIPASE 32 02/26/2016 1547       Studies/Results: CT ABDOMEN PELVIS W CONTRAST  Result Date: 12/21/2019 CLINICAL DATA:  49 year old female with history of abdominal pain status post laparoscopic hysterectomy. Evaluate for bowel obstruction. EXAM: CT ABDOMEN AND PELVIS WITH CONTRAST TECHNIQUE: Multidetector CT imaging of the abdomen and pelvis was performed using the standard protocol following bolus administration of intravenous contrast. CONTRAST:  156mL OMNIPAQUE IOHEXOL 300 MG/ML  SOLN COMPARISON:  No priors. FINDINGS: Lower chest: Elevation of the left hemidiaphragm. Areas of linear scarring in the lower lobes of the lungs bilaterally. Trace bilateral pleural effusions (right greater than left). Hepatobiliary: No suspicious cystic or solid hepatic lesions. No intra or extrahepatic biliary ductal dilatation. Gallbladder is nearly decompressed, but otherwise unremarkable in appearance. Pancreas: No pancreatic mass. No pancreatic ductal dilatation. No pancreatic or peripancreatic fluid collections or inflammatory changes. Spleen: Unremarkable. Adrenals/Urinary Tract: Bilateral kidneys and adrenal glands are normal in appearance. No hydroureteronephrosis. Small amount of gas adjacent to the anterior wall of the urinary  bladder. Urinary bladder is otherwise unremarkable in appearance. Stomach/Bowel: Normal appearance of the stomach. No pathologic dilatation of small bowel. Extensive gaseous distension throughout the abnormal position of the small bowel which is located superior and anterior to the transverse colon, indicating herniation within a defect in the transverse mesocolon. There also appears to be herniation of the sigmoid colon and a portion of the descending  colon through this defect in the transverse mesocolon. This is well demonstrated on axial image 56 of series 2 and coronal image 95 of series 5. The sigmoid colon and portion of the descending colon are massively distended with gas. Stool is present in the cecum, ascending colon and transverse colon, as well as in the distal rectum, but the proximal rectum and distal sigmoid colon are completely decompressed with obliterated lumen as it transitions through the internal hernia. Vascular/Lymphatic: No significant atherosclerotic disease, aneurysm or dissection noted in the abdominal or pelvic vasculature. No lymphadenopathy noted in the abdomen or pelvis. Reproductive: Status post hysterectomy. Ovaries are unremarkable in appearance. Other: Trace volume of free fluid. No large volume of ascites. Small amount of residual pneumoperitoneum. Small amount of gas in the space of Retzius. Small amount of gas along the right pelvic sidewall. Musculoskeletal: Gas in the anterior abdominal wall musculature and subcutaneous fat. There are no aggressive appearing lytic or blastic lesions noted in the visualized portions of the skeleton. IMPRESSION: 1. Postoperative changes of recent laparoscopic hysterectomy with what appears to be a close loop obstruction of the colon related to an internal herniation of both colon and small bowel through the transverse mesocolon. At this time, there is no associated small bowel obstruction. Surgical consultation is strongly recommended. 2. Trace bilateral pleural effusions. Critical Value/emergent results were called by telephone at the time of interpretation on 12/21/2019 at 12:41 pm to provider Sherwood Gambler, who verbally acknowledged these results. Electronically Signed   By: Vinnie Langton M.D.   On: 12/21/2019 12:46    Anti-infectives: Anti-infectives (From admission, onward)   Start     Dose/Rate Route Frequency Ordered Stop   12/22/19 0600  cefoTEtan (CEFOTAN) 2 g in sodium  chloride 0.9 % 100 mL IVPB        2 g 200 mL/hr over 30 Minutes Intravenous On call to O.R. 12/21/19 1411 12/21/19 1552   12/21/19 1437  sodium chloride 0.9 % with cefoTEtan (CEFOTAN) ADS Med       Note to Pharmacy: Randa Evens  : cabinet override      12/21/19 1437 12/21/19 1613       Assessment/Plan Anxiety Hx laparoscopic hysterectomy 12/18/19 by Dr. [name] Chronic intermittent symptoms of constipation and partial obstruction  Colonic obstruction, internal hernia, megacolon -S/p Exploratory laparotomy, resection of descending and sigmoid colon, end transverse colostomy 10/28 Dr. Harlow Asa - POD#1 - surgical path pending - WOC consult for new colostomy - BID wet to dry dressing changes to midline abdominal wound - continue NPO/NGT and await return in bowel function - needs to mobilize, OOB to chair - add scheduled IV robaxin to scheduled IV tylenol for pain control  ID - cefotetan periop FEN - IVF, NPO/NGT to LIWS VTE - SCDs, lovenox Foley - d/c 10/29 Follow up - ultimately will need follow up with colorectal specialist for further work up   LOS: 1 day    Wellington Hampshire, [facility] 12/22/2019, 8:44 AM Please see Amion for pager number during day hours 7:00am-4:30pm

## 2019-12-22 NOTE — Consult Note (Signed)
[facility] Nurse ostomy consult note Stoma type/location: LLQ colostomy Stomal assessment/size: 1 3/8" edematous Peristomal assessment: intact Treatment options for stomal/peristomal skin: barrier ring Output minimal firm stool ball noted Ostomy pouching: 2pc. 2 1/4" pouch with barrier ring  Education provided: Patient had 4" pouch in place. Explained to patient and spouse that we would change pouch today into the system more appropriate. Patient is not speaking due to presence of NG tube but is attentive to pouch change. Patient laughs and writes to me that her husband is "not going to be able to stomach this"  I encouraged him to watch and be moral support as well as paying attention to procedure.  Old pouch removed, skin cleansed and barrier ring applied. Explained rationale, used to improve fit and wear time.  Barrier is cut to fit and system applied.  Demonstrated emptying when 1/3 full.  Twice weekly pouch changes  Enrolled patient in [facility] program: No WOC team will follow.  Domenic Moras MSN, RN, FNP-BC CWON Wound, Ostomy, Continence Nurse Pager [phone]

## 2019-12-22 NOTE — TOC Progression Note (Addendum)
Transition of Care [facility]) - Progression Note    Patient Details  Name: Joanna Yang MRN: [id number] Date of Birth: [birth year]  Transition of Care [facility]) CM/SW Contact  Leeroy Cha, RN Phone Number: 12/22/2019, 11:18 AM  Clinical Narrative:    tct-[employer] for [facility] auth, request does not require authorization. tct-[facility], will let office know Unable to take due to staffing. Information sent to [facility]. Out of network for all [employer]. Information sent to [facility] with [employer].  Unable to staff for rn that this time. Information sent to [facility] with [facility].  Out of network for all [employer]  tct-[facility]-out of network of [employer]. tct-[facility] with [place] authorization started. Rose information given/faxed to [phone] and emaill to [email or web address]    Expected Discharge Plan and Services           Expected Discharge Date:  (unknown)                                     Social Determinants of Health (SDOH) Interventions    Readmission Risk Interventions No flowsheet data found.

## 2019-12-23 LAB — BASIC METABOLIC PANEL
Anion gap: 9 (ref 5–15)
BUN: 12 mg/dL (ref 6–20)
CO2: 22 mmol/L (ref 22–32)
Calcium: 7.9 mg/dL — ABNORMAL LOW (ref 8.9–10.3)
Chloride: 107 mmol/L (ref 98–111)
Creatinine, Ser: 0.71 mg/dL (ref 0.44–1.00)
GFR, Estimated: >60 mL/min (ref >60)
Glucose, Bld: 94 mg/dL (ref 70–99)
Potassium: 3.5 mmol/L (ref 3.5–5.1)
Sodium: 138 mmol/L (ref 135–145)

## 2019-12-23 LAB — CBC
HCT: 37.6 % (ref 36.0–46.0)
Hemoglobin: 12.8 g/dL (ref 12.0–15.0)
MCH: 31.2 pg (ref 26.0–34.0)
MCHC: 34 g/dL (ref 30.0–36.0)
MCV: 91.7 fL (ref 80.0–100.0)
Platelets: 174 10*3/uL (ref 150–400)
RBC: 4.1 MIL/uL (ref 3.87–5.11)
RDW: 12.8 % (ref 11.5–15.5)
WBC: 5.4 10*3/uL (ref 4.0–10.5)
nRBC: 0 % (ref 0.0–0.2)

## 2019-12-23 LAB — MAGNESIUM: Magnesium: 2.2 mg/dL (ref 1.7–2.4)

## 2019-12-23 MED ORDER — KETOROLAC TROMETHAMINE 15 MG/ML IJ SOLN
15.0000 mg | Freq: Three times a day (TID) | INTRAMUSCULAR | Status: DC
  Administered 2019-12-23 – 2019-12-26 (×10): 15 mg via INTRAVENOUS
  Filled 2019-12-23 (×10): qty 1

## 2019-12-23 MED ORDER — OXYCODONE HCL 5 MG PO TABS: 5.0000 mg | ORAL_TABLET | ORAL | Status: DC | PRN

## 2019-12-23 NOTE — Discharge Summary (Signed)
Physician Discharge Summary  Patient ID: Joanna Yang MRN: [id number] DOB/AGE: 48-Dec-1972 48 y.o.  Admit date: 12/18/2019 Discharge date: 12/19/19 Admission Diagnoses: 1.  Menorrhagia with irregular menses. 2.  Uterine fibroid. 3.  Endometrial polyp. 4.  History of anemia.   Discharge Diagnoses:  1.  Menorrhagia with irregular menses. 2.  Uterine fibroid. 3.  Endometrial polyp. 4.  History of anemia. 5.  Status post Total laparoscopic hysterectomy with bilateral salpingectomy, lysis of adhesion, cystoscopy 6.   Ileus with history of abdominal distention.   Active Problems:   Status post surgery   Status post laparoscopic hysterectomy   Discharged Condition: stable  Hospital Course: The patient was admitted on 12/18/19 for a total laparoscopic hysterectomy with bilateral salpingectomy, lysis of adhesion, cystoscopy which were performed without complication while under general anesthesia.  Findings at the termination of the procedure was dilated bowel with gas in the upper abdomen. No findings of upper abdominal bleeding or trauma were noted.  Post operatively, the patient had urinary retention and did require in and out catheterization.  She voided spontaneously after this.  The patient's pain control regimen was converted over to Percocet and Motrin when the patient began taking po well.  She ambulated independently and wore PAS and Ted hose for DVT prophylaxis while in bed.  She received Lovenox preop for DVT prophylaxis. The patient's vital signs remained stable and she demonstrated no signs of infection during her hospitalization.  The patient's post op day one Hgb was 13.6.  She was tolerating the this well.  She had essentially no vaginal bleeding, and her incisions demonstrated no signs of erythema or significant drainage.  On post op day one, the patient demonstrated abdominal distention and she had normal bowel sounds.  She was passing flatus. She had no nausea or vomiting.  She  received a Dulcolax suppository and had results with a bowel movement and passage of further gas.  She reported a long history of significant abdominal distention similar to her current clinical picture, at times feeling like she was had distention equal to a pregnant abdomen. She stated she controlled this with use or Miralax.  A decision was made for discharge to home with follow up in 48 hours.  She received ileus precautions.   Consults: None  Significant Diagnostic Studies: labs:  See Hospital Course.  Treatments: surgery:   total laparoscopic hysterectomy with bilateral salpingectomy, lysis of adhesion, cystoscopy  Discharge Exam: Blood pressure 111/67, pulse 70, temperature 98.2 F (36.8 C), resp. rate 17, height 5' 5.5" (1.664 m), weight 74.2 kg, SpO2 97 %. GI: incision: clean, dry and intact and abdomen distended with active normal bowel sounds, nontender.   Disposition: Discharge disposition: 01-Home or Self Care      Discharge instructions were reviewed in verbal and written form.  Allergies as of 12/19/2019      Reactions   Sulfa Antibiotics Swelling   Sulfamethoxazole-trimethoprim Swelling      Medication List    STOP taking these medications   acetaminophen 500 MG tablet Commonly known as: TYLENOL   diclofenac 75 MG EC tablet Commonly known as: VOLTAREN   ferrous sulfate 325 (65 FE) MG tablet   norethindrone 5 MG tablet Commonly known as: AYGESTIN     TAKE these medications   FLUoxetine 20 MG capsule Commonly known as: PROZAC Take 40 mg by mouth daily.   ibuprofen 800 MG tablet Commonly known as: ADVIL Take 1 tablet (800 mg total) by mouth every 8 (eight) hours as  needed for mild pain or moderate pain. Notes to patient: Take next dose at 6:00 pm tonight   oxyCODONE-acetaminophen 5-325 MG tablet Commonly known as: PERCOCET/ROXICET Take 1-2 tablets by mouth every 4 (four) hours as needed for moderate pain.   polyethylene glycol 17 g packet Commonly  known as: MIRALAX / GLYCOLAX Take 8.5 g by mouth daily. Takes 1/2 capful daily   simethicone 80 MG chewable tablet Commonly known as: MYLICON Chew 1 tablet (80 mg total) by mouth 4 (four) times daily as needed for flatulence.       Follow-up Information    Nunzio Cobbs, MD In 2 days.   Specialty: Obstetrics and Gynecology Contact information: [address] [place] [address] [phone]               Signed: Arloa Koh 12/23/2019, 9:38 AM

## 2019-12-23 NOTE — Progress Notes (Signed)
2 Days Post-Op  Subjective: Minimal NG output. Colostomy productive. Patient denies nausea. Reports difficulty with pain control.  Objective: Vital signs in last 24 hours: Temp:  [98.1 F (36.7 C)-98.7 F (37.1 C)] 98.1 F (36.7 C) (10/30 0434) Pulse Rate:  [75-84] 75 (10/30 0434) Resp:  [15-17] 17 (10/30 0434) BP: (115-125)/(78-88) 115/80 (10/30 0434) SpO2:  [98 %-100 %] 98 % (10/30 0434)    Intake/Output from previous day: 10/29 0701 - 10/30 0700 In: 338.1 [I.V.:188.1; IV Piggyback:150] Out: 1520 [Urine:1500; Emesis/NG output:20] Intake/Output this shift: No intake/output data recorded.  PE: General: resting comfortably, NAD Neuro: alert and oriented, no focal deficits HEENT: NG in place, draining minimal gastric contents Abdomen: mildly distended but soft, midline incision open at the skin, wound is clean and dry with no surrounding erythema. LLQ colostomy with stool and gas in bag. Extremities: warm and well-perfused  Lab Results:  Recent Labs    12/22/19 0412 12/23/19 0600  WBC 9.2 5.4  HGB 14.2 12.8  HCT 41.6 37.6  PLT 191 174   BMET Recent Labs    12/22/19 0412 12/23/19 0600  NA 133* 138  K 4.1 3.5  CL 101 107  CO2 22 22  GLUCOSE 138* 94  BUN 10 12  CREATININE 0.79 0.71  CALCIUM 8.3* 7.9*   PT/INR No results for input(s): LABPROT, INR in the last 72 hours. CMP     Component Value Date/Time   NA 138 12/23/2019 0600   NA 143 02/26/2016 1547   K 3.5 12/23/2019 0600   CL 107 12/23/2019 0600   CO2 22 12/23/2019 0600   GLUCOSE 94 12/23/2019 0600   BUN 12 12/23/2019 0600   BUN 9 02/26/2016 1547   CREATININE 0.71 12/23/2019 0600   CREATININE 0.72 01/10/2016 1701   CALCIUM 7.9 (L) 12/23/2019 0600   PROT 7.3 12/21/2019 1120   PROT 6.4 02/26/2016 1547   ALBUMIN 4.0 12/21/2019 1120   ALBUMIN 4.3 02/26/2016 1547   AST 21 12/21/2019 1120   ALT 29 12/21/2019 1120   ALKPHOS 35 (L) 12/21/2019 1120   BILITOT 1.0 12/21/2019 1120   BILITOT 0.3  02/26/2016 1547   GFRNONAA >60 12/23/2019 0600   GFRAA >60 07/28/2019 1937   Lipase     Component Value Date/Time   LIPASE 32 02/26/2016 1547       Studies/Results: CT ABDOMEN PELVIS W CONTRAST  Result Date: 12/21/2019 CLINICAL DATA:  49 year old female with history of abdominal pain status post laparoscopic hysterectomy. Evaluate for bowel obstruction. EXAM: CT ABDOMEN AND PELVIS WITH CONTRAST TECHNIQUE: Multidetector CT imaging of the abdomen and pelvis was performed using the standard protocol following bolus administration of intravenous contrast. CONTRAST:  169mL OMNIPAQUE IOHEXOL 300 MG/ML  SOLN COMPARISON:  No priors. FINDINGS: Lower chest: Elevation of the left hemidiaphragm. Areas of linear scarring in the lower lobes of the lungs bilaterally. Trace bilateral pleural effusions (right greater than left). Hepatobiliary: No suspicious cystic or solid hepatic lesions. No intra or extrahepatic biliary ductal dilatation. Gallbladder is nearly decompressed, but otherwise unremarkable in appearance. Pancreas: No pancreatic mass. No pancreatic ductal dilatation. No pancreatic or peripancreatic fluid collections or inflammatory changes. Spleen: Unremarkable. Adrenals/Urinary Tract: Bilateral kidneys and adrenal glands are normal in appearance. No hydroureteronephrosis. Small amount of gas adjacent to the anterior wall of the urinary bladder. Urinary bladder is otherwise unremarkable in appearance. Stomach/Bowel: Normal appearance of the stomach. No pathologic dilatation of small bowel. Extensive gaseous distension throughout the abnormal position of the small bowel  which is located superior and anterior to the transverse colon, indicating herniation within a defect in the transverse mesocolon. There also appears to be herniation of the sigmoid colon and a portion of the descending colon through this defect in the transverse mesocolon. This is well demonstrated on axial image 56 of series 2 and  coronal image 95 of series 5. The sigmoid colon and portion of the descending colon are massively distended with gas. Stool is present in the cecum, ascending colon and transverse colon, as well as in the distal rectum, but the proximal rectum and distal sigmoid colon are completely decompressed with obliterated lumen as it transitions through the internal hernia. Vascular/Lymphatic: No significant atherosclerotic disease, aneurysm or dissection noted in the abdominal or pelvic vasculature. No lymphadenopathy noted in the abdomen or pelvis. Reproductive: Status post hysterectomy. Ovaries are unremarkable in appearance. Other: Trace volume of free fluid. No large volume of ascites. Small amount of residual pneumoperitoneum. Small amount of gas in the space of Retzius. Small amount of gas along the right pelvic sidewall. Musculoskeletal: Gas in the anterior abdominal wall musculature and subcutaneous fat. There are no aggressive appearing lytic or blastic lesions noted in the visualized portions of the skeleton. IMPRESSION: 1. Postoperative changes of recent laparoscopic hysterectomy with what appears to be a close loop obstruction of the colon related to an internal herniation of both colon and small bowel through the transverse mesocolon. At this time, there is no associated small bowel obstruction. Surgical consultation is strongly recommended. 2. Trace bilateral pleural effusions. Critical Value/emergent results were called by telephone at the time of interpretation on 12/21/2019 at 12:41 pm to provider Sherwood Gambler, who verbally acknowledged these results. Electronically Signed   By: Vinnie Langton M.D.   On: 12/21/2019 12:46    Anti-infectives: Anti-infectives (From admission, onward)   Start     Dose/Rate Route Frequency Ordered Stop   12/22/19 0600  cefoTEtan (CEFOTAN) 2 g in sodium chloride 0.9 % 100 mL IVPB        2 g 200 mL/hr over 30 Minutes Intravenous On call to O.R. 12/21/19 1411 12/21/19  1552   12/21/19 1437  sodium chloride 0.9 % with cefoTEtan (CEFOTAN) ADS Med       Note to Pharmacy: Randa Evens  : cabinet override      12/21/19 1437 12/21/19 1613       Assessment/Plan 49 yo female with large bowel obstruction and megacolon, POD2 s/p exploratory laparotomy, resection of descending and sigmoid colon with end colostomy. - Remove NG tube - Advance to clear liquid diet - Add on PO pain medications and toradol - Ambulate - VTE: lovenox 40mg  daily - Dispo: inpatient for continued postop care   LOS: 2 days    Michaelle Birks, MD [facility] Surgery General, Hepatobiliary and Pancreatic Surgery 12/23/19 7:52 AM

## 2019-12-24 LAB — BASIC METABOLIC PANEL
Anion gap: 8 (ref 5–15)
BUN: 7 mg/dL (ref 6–20)
CO2: 23 mmol/L (ref 22–32)
Calcium: 8.6 mg/dL — ABNORMAL LOW (ref 8.9–10.3)
Chloride: 109 mmol/L (ref 98–111)
Creatinine, Ser: 0.75 mg/dL (ref 0.44–1.00)
GFR, Estimated: >60 mL/min (ref >60)
Glucose, Bld: 103 mg/dL — ABNORMAL HIGH (ref 70–99)
Potassium: 3.8 mmol/L (ref 3.5–5.1)
Sodium: 140 mmol/L (ref 135–145)

## 2019-12-24 MED ORDER — MORPHINE SULFATE (PF) 2 MG/ML IV SOLN: 2.0000 mg | INTRAVENOUS | Status: DC | PRN

## 2019-12-24 MED ORDER — METHOCARBAMOL 500 MG PO TABS
500.0000 mg | ORAL_TABLET | Freq: Four times a day (QID) | ORAL | Status: DC
  Administered 2019-12-24 – 2019-12-26 (×10): 500 mg via ORAL
  Filled 2019-12-24 (×10): qty 1

## 2019-12-24 MED ORDER — PANTOPRAZOLE SODIUM 40 MG PO TBEC
40.0000 mg | DELAYED_RELEASE_TABLET | Freq: Every day | ORAL | Status: DC
  Administered 2019-12-24 – 2019-12-25 (×2): 40 mg via ORAL
  Filled 2019-12-24 (×2): qty 1

## 2019-12-24 NOTE — Progress Notes (Signed)
The patient is receiving Protonix by the intravenous route.  Based on criteria approved by the Pharmacy and [employer], the medication is being converted to the equivalent oral dose form.  These criteria include: -No active GI bleeding -Able to tolerate diet of full liquids (or better) or tube feeding -Able to tolerate other medications by the oral or enteral route  If you have any questions about this conversion, please contact the Pharmacy Department (phone [phone]).  Thank you.  [place], [place] [phone] 12/24/2019, 1:51 PM

## 2019-12-24 NOTE — Progress Notes (Signed)
  Subjective:  Patient ID: Joanna Yang, female    DOB: 26-Mar-1970,  MRN: [id number]  Chief Complaint  Patient presents with  . Foot Problem    Pt states painful lesion sub 4th digit 6 month duration "Feels like walking on something". No known injuries.    49 y.o. female presents with the above complaint. History confirmed with patient.   Objective:  Physical Exam: warm, good capillary refill, no trophic changes or ulcerative lesions, normal DP and PT pulses and normal sensory exam. Left Foot: pain palpation about the fourth MPJ, slight hyperkeratosis Right Foot: Hammertoe deformities with interdigital corn fourth fifth toes,   No images are attached to the encounter.  Assessment:   1. Hammertoe of right foot   2. Pain in toe of right foot   3. Adductovarus rotation of toe, acquired, right   4. Capsulitis of metatarsophalangeal (MTP) joint of left foot      Plan:  Patient was evaluated and treated and all questions answered.  Hammertoe right foot -Hammertoe improved  Capsulitis fourth met left with hyperkeratosis -Callus debrided, educated etiology discussed padding and shoe gear changes  No follow-ups on file.

## 2019-12-24 NOTE — Progress Notes (Signed)
    3 Days Post-Op  Subjective: NG removed yesterday, no nausea or vomiting. Tolerating clear liquids, requesting more to eat today. Ambulating. Pain controlled. Colostomy functioning.  Objective: Vital signs in last 24 hours: Temp:  [97.7 F (36.5 C)-98.3 F (36.8 C)] 98.1 F (36.7 C) (10/31 0643) Pulse Rate:  [73-78] 73 (10/31 0643) Resp:  [17-20] 17 (10/31 0643) BP: (113-126)/(80-84) 113/80 (10/31 0643) SpO2:  [97 %-100 %] 98 % (10/31 0643) Last BM Date: 12/23/19  Intake/Output from previous day: 10/30 0701 - 10/31 0700 In: 1130.5 [P.O.:300; I.V.:516.3; IV Piggyback:314.2] Out: 50 [Stool:50] Intake/Output this shift: No intake/output data recorded.  PE: General: resting comfortably, NAD Neuro: alert and oriented, no focal deficits Abdomen: soft, nondistended, midline incision open at the skin, wound is clean and dry with no surrounding erythema. LLQ colostomy with stool and gas in bag. Extremities: warm and well-perfused  Lab Results:  Recent Labs    12/22/19 0412 12/23/19 0600  WBC 9.2 5.4  HGB 14.2 12.8  HCT 41.6 37.6  PLT 191 174   BMET Recent Labs    12/23/19 0600 12/24/19 0533  NA 138 140  K 3.5 3.8  CL 107 109  CO2 22 23  GLUCOSE 94 103*  BUN 12 7  CREATININE 0.71 0.75  CALCIUM 7.9* 8.6*   PT/INR No results for input(s): LABPROT, INR in the last 72 hours. CMP     Component Value Date/Time   NA 140 12/24/2019 0533   NA 143 02/26/2016 1547   K 3.8 12/24/2019 0533   CL 109 12/24/2019 0533   CO2 23 12/24/2019 0533   GLUCOSE 103 (H) 12/24/2019 0533   BUN 7 12/24/2019 0533   BUN 9 02/26/2016 1547   CREATININE 0.75 12/24/2019 0533   CREATININE 0.72 01/10/2016 1701   CALCIUM 8.6 (L) 12/24/2019 0533   PROT 7.3 12/21/2019 1120   PROT 6.4 02/26/2016 1547   ALBUMIN 4.0 12/21/2019 1120   ALBUMIN 4.3 02/26/2016 1547   AST 21 12/21/2019 1120   ALT 29 12/21/2019 1120   ALKPHOS 35 (L) 12/21/2019 1120   BILITOT 1.0 12/21/2019 1120   BILITOT 0.3  02/26/2016 1547   GFRNONAA >60 12/24/2019 0533   GFRAA >60 07/28/2019 1937   Lipase     Component Value Date/Time   LIPASE 32 02/26/2016 1547       Studies/Results: No results found.  Anti-infectives: Anti-infectives (From admission, onward)   Start     Dose/Rate Route Frequency Ordered Stop   12/22/19 0600  cefoTEtan (CEFOTAN) 2 g in sodium chloride 0.9 % 100 mL IVPB        2 g 200 mL/hr over 30 Minutes Intravenous On call to O.R. 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 - Full liquid diet for breakfast this morning. Advance to soft diet at dinner if tolerating. - continue multimodal pain control, wean IV narcotics - Ambulate - Surgical path pending - VTE: lovenox 40mg  daily - Dispo: inpatient for continued postop care   LOS: 3 days    Michaelle Birks, MD [facility] Surgery General, Hepatobiliary and Pancreatic Surgery 12/24/19 8:05 AM

## 2019-12-25 LAB — SURGICAL PATHOLOGY

## 2019-12-25 NOTE — Progress Notes (Addendum)
PT Cancellation Note  Patient Details Name: Joanna Yang MRN: [id number] DOB: 1970/07/25   Cancelled Treatment:    Reason Eval/Treat Not Completed: PT screened, no needs identified, will sign off. Pt reports she has been up amb in the room and in hallway without difficulty. Pt feels she is returning to her baseline mobility, no further need in acute setting/no f/u.   [facility] 12/25/2019, 4:17 PM

## 2019-12-25 NOTE — TOC Progression Note (Signed)
Transition of Care [facility]) - Progression Note    Patient Details  Name: Joanna Yang MRN: [id number] Date of Birth: 03-28-1970  Transition of Care [facility]) CM/SW [place] Phone Number: 12/25/2019, 11:40 AM  Clinical Narrative:    CSW met with the patient at bedside to discuss home health options. CSW notified the patient home health agency [facility] has RN staff available to assist with wound care/colosotmy care at home.Patient thankful and agreeable to services. CSW  informed the patient the agency will provide a few visits for continued teaching. Patient report understanding. She reports her spouse will assist as well. She has plan to video the colostomy change process for reference. Patient inquired about supplies and different "bag choices." Patient to discuss with the RN and [place] nurse.   Expected Discharge Plan: [place] Barriers to Discharge: Continued Medical Work up  Expected Discharge Plan and Services Expected Discharge Plan: [place] In-house Referral: Clinical Social Work Discharge Planning Services: CM Consult Post Acute Care Choice: [place] arrangements for the past 2 months: Single Family Home Expected Discharge Date:  (unknown)                         HH Arranged: RN [place] Agency: [place] ([place]) Date [place]: 12/25/19 Time [place]: 1140 Representative spoke with at [place]: [place] ([place]) Interventions    Readmission Risk Interventions No flowsheet data found.

## 2019-12-25 NOTE — Progress Notes (Signed)
Observed and taught patient to empty ostomy bag.  Patient did well.  All questions were answered.

## 2019-12-25 NOTE — Progress Notes (Signed)
    4 Days Post-Op  Subjective: Tolerating a diet.  Ambulating. Pain controlled. Colostomy functioning.  Objective: Vital signs in last 24 hours: Temp:  [98.1 F (36.7 C)-98.6 F (37 C)] 98.1 F (36.7 C) (11/01 0511) Pulse Rate:  [69-83] 69 (11/01 0511) Resp:  [18] 18 (11/01 0511) BP: (113-120)/(81-84) 120/84 (11/01 0511) SpO2:  [98 %-99 %] 98 % (11/01 0511) Last BM Date: 12/25/19  Intake/Output from previous day: 10/31 0701 - 11/01 0700 In: 1130 [P.O.:1130] Out: -  Intake/Output this shift: No intake/output data recorded.  PE: General: resting comfortably, NAD Neuro: alert and oriented, no focal deficits Abdomen: soft, nondistended, midline incision open at the skin, wound is clean and dry with no surrounding erythema. LLQ colostomy with stool and gas in bag. Extremities: warm and well-perfused  Lab Results:  Recent Labs    12/23/19 0600  WBC 5.4  HGB 12.8  HCT 37.6  PLT 174   BMET Recent Labs    12/23/19 0600 12/24/19 0533  NA 138 140  K 3.5 3.8  CL 107 109  CO2 22 23  GLUCOSE 94 103*  BUN 12 7  CREATININE 0.71 0.75  CALCIUM 7.9* 8.6*   PT/INR No results for input(s): LABPROT, INR in the last 72 hours. CMP     Component Value Date/Time   NA 140 12/24/2019 0533   NA 143 02/26/2016 1547   K 3.8 12/24/2019 0533   CL 109 12/24/2019 0533   CO2 23 12/24/2019 0533   GLUCOSE 103 (H) 12/24/2019 0533   BUN 7 12/24/2019 0533   BUN 9 02/26/2016 1547   CREATININE 0.75 12/24/2019 0533   CREATININE 0.72 01/10/2016 1701   CALCIUM 8.6 (L) 12/24/2019 0533   PROT 7.3 12/21/2019 1120   PROT 6.4 02/26/2016 1547   ALBUMIN 4.0 12/21/2019 1120   ALBUMIN 4.3 02/26/2016 1547   AST 21 12/21/2019 1120   ALT 29 12/21/2019 1120   ALKPHOS 35 (L) 12/21/2019 1120   BILITOT 1.0 12/21/2019 1120   BILITOT 0.3 02/26/2016 1547   GFRNONAA >60 12/24/2019 0533   GFRAA >60 07/28/2019 1937   Lipase     Component Value Date/Time   LIPASE 32 02/26/2016 1547        Studies/Results: No results found.  Anti-infectives: Anti-infectives (From admission, onward)   Start     Dose/Rate Route Frequency Ordered Stop   12/22/19 0600  cefoTEtan (CEFOTAN) 2 g in sodium chloride 0.9 % 100 mL IVPB        2 g 200 mL/hr over 30 Minutes Intravenous On call to O.R. 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 - cont soft diet - continue multimodal pain control, wean IV narcotics - Ambulate - Surgical path pending - VTE: lovenox 40mg  daily - Dispo: inpatient for continued postop care and ostomy teaching   LOS: 4 days    Rosario Adie, MD  Colorectal and General Surgery [facility]  12/25/19 9:32 AM

## 2019-12-25 NOTE — TOC Progression Note (Signed)
Transition of Care [facility]) - Progression Note    Patient Details  Name: Joanna Yang MRN: [id number] Date of Birth: October 26, 1970  Transition of Care [facility]) CM/SW Contact  Leeroy Cha, RN Phone Number: 12/25/2019, 10:56 AM  Clinical Narrative:    [facility] requested update and orders for hhc sent to [phone]        Expected Discharge Plan and Services           Expected Discharge Date:  (unknown)                                     Social Determinants of Health (SDOH) Interventions    Readmission Risk Interventions No flowsheet data found.

## 2019-12-26 MED ORDER — TRAMADOL HCL 50 MG PO TABS
50.0000 mg | ORAL_TABLET | Freq: Four times a day (QID) | ORAL | Status: DC | PRN
  Administered 2019-12-26 (×2): 50 mg via ORAL
  Filled 2019-12-26 (×2): qty 1

## 2019-12-26 MED ORDER — METHOCARBAMOL 500 MG PO TABS: 500.0000 mg | ORAL_TABLET | Freq: Four times a day (QID) | ORAL | 1 refills | Status: DC | PRN

## 2019-12-26 MED ORDER — TRAMADOL HCL 50 MG PO TABS: 50.0000 mg | ORAL_TABLET | Freq: Four times a day (QID) | ORAL | 0 refills | Status: DC | PRN

## 2019-12-26 NOTE — TOC Transition Note (Signed)
Transition of Care [facility]) - CM/SW Discharge Note   Patient Details  Name: Joanna Yang MRN: [id number] Date of Birth: Jul 11, 1970  Transition of Care [facility]) CM/SW Contact:  Lia Hopping, [place] Phone Number: 12/26/2019, 12:30 PM   Clinical Narrative:    CSW notified [place] ([place]) [place] will discharge home today.  CSW will sign off.   Final next level of care: [place] Barriers to Discharge: Barriers Resolved   Patient Goals and CMS Choice Patient states their goals for this hospitalization and ongoing recovery are:: return home   Choice offered to / list presented to : NA  Discharge Placement                       Discharge Plan and Services In-house Referral: Clinical Social Work Discharge Planning Services: CM Consult Post Acute Care Choice: [place]: RN [facility] Agency: [place] ([place]) Date [place]: 12/25/19 Time [place]: 1140 Representative spoke with at [place]: [place] ([place]) Interventions     Readmission Risk Interventions No flowsheet data found.

## 2019-12-26 NOTE — Discharge Summary (Signed)
Patient ID: Joanna Yang [id number] [birth year] 49 y.o.  Admit date: 12/21/2019 Discharge date: 12/26/2019  Admitting Diagnosis: Closed loop bowel obstruction  Discharge Diagnosis Patient Active Problem List   Diagnosis Date Noted  . Internal hernia 12/21/2019  . Colonic obstruction ([place]) 12/21/2019  . Status post surgery 12/18/2019  . Status post laparoscopic hysterectomy 12/18/2019  . Genetic testing 10/07/2019  . Family history of breast cancer   . Discomfort in chest 10/29/2016  . Chronic fatigue 10/29/2016  . Allergic rhinitis due to allergen 10/29/2016  . Vitamin D deficiency 10/29/2016    Consultants None   H&P: Joanna Yang is a 49yo female PMH anxiety who underwent laparoscopic hysterectomy 12/18/19 by Dr. [name], who was sent to the ED today from her office for evaluation of abdominal distension. The patient states that she has had intermittent abdominal distension for about 12 years. She takes miralax daily but if she misses a dose she will become distended. Typically the distension will resolve with diet adjustments and miralax. After her surgery 3 days ago the distension has been gradually worse. Some mild nausea, no emesis. Last BM 3 days ago after a suppository. Passing minimal flatus. States that the distension has never been this severe or prolonged before.  In the ED she underwent CT scan which showed a closed loop obstruction of the colon related to an internal herniation of both colon and small bowel through the transverse mesocolon. WBC 6.3, VSS. Covid test is negative. General surgery asked to see.  Procedures Dr. Harlow Asa - Exploratory laparotomy, resection of descending and sigmoid colon, end transverse colostomy - 12/21/2019  Hospital Course:  Patient presented to the ED as above and was found to have a closed loop obstruction of the colon related to an internal herniation of both colon and small bowel through the transverse mesocolon on CT. General  surgery was consulted. Patient was taken emergently to the OR and underwent Exploratory laparotomy, resection of descending and sigmoid colon, end transverse colostomy. She was found to have megacolon intraop. Patient tolerated the procedure well and was transferred to the floor. Patient had an ileus post operative that resolved, her diet was advanced and tolerated. WOCN worked with patient post op for patients new ostomy. Patient worked with therapies who cleared patient. Surgical path without evidence of dysplasia or malignancy. On POD #5, the patient was voiding well, tolerating diet, ambulating well, pain well controlled, vital signs stable, ostomy functioning, midline wound clean and felt stable for discharge home. [place] service were arranged.    Allergies as of 12/26/2019      Reactions   Sulfa Antibiotics Swelling   Sulfamethoxazole-trimethoprim Swelling      Medication List    STOP taking these medications   FLUoxetine 20 MG capsule Commonly known as: PROZAC   oxyCODONE-acetaminophen 5-325 MG tablet Commonly known as: PERCOCET/ROXICET   polyethylene glycol 17 g packet Commonly known as: MIRALAX / GLYCOLAX     TAKE these medications   acetaminophen 325 MG tablet Commonly known as: TYLENOL Take 650 mg by mouth every 6 (six) hours as needed for mild pain, fever or headache.   ibuprofen 800 MG tablet Commonly known as: ADVIL Take 1 tablet (800 mg total) by mouth every 8 (eight) hours as needed for mild pain or moderate pain.   methocarbamol 500 MG tablet Commonly known as: ROBAXIN Take 1 tablet (500 mg total) by mouth every 6 (six) hours as needed for muscle spasms.   simethicone 80 MG chewable tablet Commonly  known as: MYLICON Chew 1 tablet (80 mg total) by mouth 4 (four) times daily as needed for flatulence.   traMADol 50 MG tablet Commonly known as: ULTRAM Take 1 tablet (50 mg total) by mouth every 6 (six) hours as needed (breakthrough pain).         Follow-up  Information    Chesley Noon, MD Follow up.   Specialty: Family Medicine Contact information: [place] [address] [phone]        Armandina Gemma, MD. Go on 01/17/2020.   Specialty: General Surgery Why: 930am. Please arrive 30 minutes prior to your appointment for paperwork. Please bring a copy of your photo ID and insurance card to the appointment.  Contact information: [place] [phone]               Signed: Obie Dredge, [facility] 12/26/2019, 12:29 PM Please see Amion for pager number during day hours 7:00am-4:30pm

## 2019-12-26 NOTE — Consult Note (Signed)
[place] Nurse ostomy consult note Stoma type/location: LLQ colostomy Stomal assessment/size: Oval, slightly less than 2 inches from side to side, 1 and 1/4 inches from top to bottom. Raised, red, edematous, lumen at center Peristomal assessment: intact, clear Treatment options for stomal/peristomal skin: Skin barrier ring Output: brown soft stool Ostomy pouching: 2pc. 2 and 1/4 inch ostomy pouching system with skin barrier ring Education provided:  Patient provided with ostomy pouching educational folder and booklet.  Electronic resources demonstrated. Explained stoma characteristics (budded, flush, color, texture, care) Demonstrated pouch change (cutting new skin barrier, measuring stoma, cleaning peristomal skin and stoma, use of barrier ring) Education on emptying when 1/3 to 1/2 full and how to empty Demonstrated use of wick to clean spout  Discussed bathing, diet, gas, medication use, constipation Discussed risk of peristomal hernia Answered patient questions. Patient recorded pouch change procedure in installments with my permission.   Enrolled patient in [place] program: Yes, today.  Discharge supplies provided.  [place] nursing team will follow and will remain available to this patient, the nursing and medical teams.   Thanks, Maudie Flakes, MSN, RN, [place], Arther Abbott  Pager# [phone]

## 2019-12-26 NOTE — Progress Notes (Signed)
Discharge instructions given to pt and all questions were answered. Pt and husband were taught how to do dressing changes.

## 2019-12-26 NOTE — Discharge Instructions (Signed)
[facility]      [facility] [phone]  OPEN ABDOMINAL SURGERY: POST OP INSTRUCTIONS  Always review your discharge instruction sheet given to you by the facility where your surgery was performed.  IF YOU HAVE DISABILITY OR FAMILY LEAVE FORMS, YOU MUST BRING THEM TO THE OFFICE FOR PROCESSING.  PLEASE DO NOT GIVE THEM TO YOUR DOCTOR.  1. A prescription for pain medication may be given to you upon discharge.  Take your pain medication as prescribed, if needed.  If narcotic pain medicine is not needed, then you may take acetaminophen (Tylenol) or ibuprofen (Advil) as needed. 2. Take your usually prescribed medications unless otherwise directed. 3. If you need a refill on your pain medication, please contact your pharmacy. They will contact our office to request authorization.  Prescriptions will not be filled after 5pm or on week-ends. 4. You should follow a light diet the first few days after arrival home, such as soup and crackers, pudding, etc.unless your doctor has advised otherwise. A high-fiber, low fat diet can be resumed as tolerated.   Be sure to include lots of fluids daily. Most patients will experience some swelling and bruising on the chest and neck area.  Ice packs will help.  Swelling and bruising can take several days to resolve 5. Most patients will experience some swelling and bruising in the area of the incision. Ice pack will help. Swelling and bruising can take several days to resolve..  6. It is common to experience some constipation if taking pain medication after surgery.  Increasing fluid intake and taking a stool softener will usually help or prevent this problem from occurring.  A mild laxative (Milk of Magnesia or Miralax) should be taken according to package directions if there are no bowel movements after 48 hours. 7.  You may have steri-strips (small skin tapes) in place directly over the incision.  These strips should be left on the skin for 7-10 days.  If your  surgeon used skin glue on the incision, you may shower in 24 hours.  The glue will flake off over the next 2-3 weeks.  Any sutures or staples will be removed at the office during your follow-up visit. You may find that a light gauze bandage over your incision may keep your staples from being rubbed or pulled. You may shower and replace the bandage daily. 8. ACTIVITIES:  You may resume regular (light) daily activities beginning the next day--such as daily self-care, walking, climbing stairs--gradually increasing activities as tolerated.  You may have sexual intercourse when it is comfortable.  Refrain from any heavy lifting or straining until approved by your doctor. a. You may drive when you no longer are taking prescription pain medication, you can comfortably wear a seatbelt, and you can safely maneuver your car and apply brakes b. Return to Work: ___________________________________ 9. You should see your doctor in the office for a follow-up appointment approximately two weeks after your surgery.  Make sure that you call for this appointment within a day or two after you arrive home to insure a convenient appointment time. OTHER INSTRUCTIONS:  _____________________________________________________________ _____________________________________________________________  WHEN TO CALL YOUR DOCTOR: 1. Fever over 101.0 2. Inability to urinate 3. Nausea and/or vomiting 4. Extreme swelling or bruising 5. Continued bleeding from incision. 6. Increased pain, redness, or drainage from the incision. 7. Difficulty swallowing or breathing 8. Muscle cramping or spasms. 9. Numbness or tingling in hands or feet or around lips.  The clinic staff is available to   answer your questions during regular business hours.  Please don't hesitate to call and ask to speak to one of the nurses if you have concerns.  For further questions, please visit [email or web address]    Colostomy Surgery, Adult, Care  After  This sheet gives you information about how to care for yourself after your procedure. Your health care provider may also give you more specific instructions. If you have problems or questions, contact your health care provider. What can I expect after the procedure? After the procedure, it is common to have:  Swelling at the opening that was created during the procedure (stoma).  Slight bleeding around the stoma.  Redness around the stoma. Follow these instructions at home: Activity  Rest as needed while the stoma area heals.  Return to your normal activities as told by your health care provider. Ask your health care provider what activities are safe for you.  Avoid strenuous activity and abdominal exercises for 3 weeks or for as long as told by your health care provider.  Do not lift anything that is heavier than 10 lb (4.5 kg), or the limit that you are told, until your health care provider says that it is safe. Incision care  Follow instructions from your health care provider about how to take care of your incision. Make sure you: ? Wash your hands with soap and water before you change your bandage (dressing). If soap and water are not available, use hand sanitizer. ? Change your dressing as told by your health care provider. ? Leave stitches (sutures), skin glue, or adhesive strips in place. These skin closures may need to stay in place for 2 weeks or longer. If adhesive strip edges start to loosen and curl up, you may trim the loose edges. Do not remove adhesive strips completely unless your health care provider tells you to do that. Stoma care  Keep the stoma area clean.  Clean and dry the skin around the stoma each time you change the colostomy bag. To clean the stoma area: ? Use warm water and only use cleansers that are recommended by your health care provider. ? Rinse the stoma area with plain water. ? Dry the area well.  Use stoma powder or skin barrier film on  your skin only as told by your health care provider. Do not use any other powders, gels, wipes, or creams on the skin around the stoma.  Check the stoma area every day for signs of infection. Check for: ? More redness, swelling, or pain. ? More fluid or blood. ? Pus or warmth.  Measure the stoma opening regularly and record the size. Watch for changes. Share this information with your health care provider. Bathing  Do not take baths, swim, or use a hot tub until your health care provider approves. Ask your health care provider if you may take showers. You may be able to shower with or without the colostomy bag in place. If you bathe with the bag on, dry the bag afterward.  Avoid using harsh or oily soaps when you bathe. Colostomy bag care  Follow instructions from your health care provider about how to empty or change the colostomy bag.  Keep colostomy supplies with you at all times.  Store all supplies in a cool, dry place.  Empty the colostomy bag: ? Whenever it is one-third to one-half full. ? At bedtime.  Replace the bag every 3-4 days for the first 6 weeks, then every 4-7 days. Driving  Follow driving  restrictions as told by your health care provider.  Do not drive or use heavy machinery while taking prescription pain medicine. General instructions  Follow instructions from your health care provider about eating or drinking restrictions.  Take over-the-counter and prescription medicines only as told by your health care provider.  Avoid wearing clothes that are tight directly over your stoma area.  Do not use any products that contain nicotine or tobacco, such as cigarettes and e-cigarettes. If you need help quitting, ask your health care provider.  If you are a woman, ask your health care provider about becoming pregnant and about using birth control. Medicines may not be absorbed normally after the procedure.  Keep all follow-up visits as told by your health care  provider. This is important. Contact a health care provider if you have:  Trouble caring for your stoma or changing the colostomy bag.  Nausea or vomiting.  A fever.  More redness, swelling, or pain at the site of your stoma or around your anus.  More fluid or blood coming from your stoma or your anus.  Warmth around your stoma area.  Pus coming from your stoma.  A change in the size or appearance of the stoma.  Abdominal pain, bloating, pressure, or cramping.  Stool more often or less often than your health care provider tells you to expect.  Very little urine production. This may be a sign of dehydration. Get help right away if you have:  Abdominal pain that does not go away or becomes severe.  Frequent vomiting.  No stool draining through the stoma.  Chest pain or an irregular heartbeat. Summary  Follow instructions from your health care provider about how to take care of your incision and stoma.  Contact a health care provider if you have trouble caring for your stoma or changing the colostomy bag.  Get help right away if you have abdominal pain that does not go away or becomes severe or if you have no stool draining through the stoma.  Keep all follow-up visits as told by your health care provider. This is important. This information is not intended to replace advice given to you by your health care provider. Make sure you discuss any questions you have with your health care provider. Document Revised: 06/08/2017 Document Reviewed: 06/08/2017 Elsevier Patient Education  [place], Adult  Colostomy surgery is done to create an opening in the front of the abdomen for stool (feces) to leave the body through an ostomy (stoma). Part of the large intestine is attached to the stoma. A bag, also called a pouch, is fitted over the stoma. Stool and gas will collect in the bag. After surgery, you will need to empty and change your colostomy  bag as needed. You will also need to care for your stoma. How to care for the stoma Your stoma should look pink, red, and moist, like the inside of your cheek. Soon after surgery, the stoma may be swollen, but this swelling will go away within 6 weeks. To care for the stoma:  Keep the skin around the stoma clean and dry.  Use a clean, soft washcloth to gently wash the stoma and the skin around it. Clean using a circular motion, and wipe away from the stoma opening, not toward it. ? Use warm water and only use cleansers recommended by your health care provider. ? Rinse the stoma area with plain water. ? Dry the area around the stoma well.  Use stoma powder or ointment on your skin only as told by your health care provider. Do not use any other powders, gels, wipes, or creams on the skin around the stoma.  Check the stoma area every day for signs of infection. Check for: ? New or worsening redness, swelling, or pain. ? New or increased fluid or blood. ? Pus or warmth.  Measure the stoma opening regularly and record the size. Watch for changes. (It is normal for the stoma to get smaller as swelling goes away.) Share this information with your health care provider. How to empty the colostomy bag  Empty your bag at bedtime and whenever it is one-third to one-half full. Do not let the bag get more than half-full with stool or gas. The bag could leak if it gets too full. Some colostomy bags have a built-in gas release valve that releases gas often throughout the day. Follow these basic steps: 1. Wash your hands with soap and water. 2. Sit far back on the toilet seat. 3. Put several pieces of toilet paper into the toilet water. This will prevent splashing as you empty stool into the toilet. 4. Remove the clip or the hook-and-loop fastener from the tail end of the bag. 5. Unroll the tail, then empty the stool into the toilet. 6. Clean the tail with toilet paper or a moist towelette. 7. Reroll the  tail, and close it with the clip or the hook-and-loop fastener. 8. Wash your hands again. How to change the colostomy bag Change your bag every 3-4 days or as often as told by your health care provider. Also change the bag if it is leaking or separating from the skin, or if your skin around the stoma looks or feels irritated. Irritated skin may be a sign that the bag is leaking. Always have colostomy supplies with you, and follow these basic steps: 1. Wash your hands with soap and water. Have paper towels or tissues nearby to clean any discharge. 2. Remove the old bag and skin barrier. Use your fingers or a warm cloth to gently push the skin away from the barrier. 3. Clean the stoma area with water or with mild soap and water, as directed. Use water to rinse away any soap. 4. Dry the skin. You may use the cool setting on a hair dryer to do this. 5. Use a tracing pattern (template) to cut the skin barrier to the size needed. 6. If you are using a two-piece bag, attach the bag and the skin barrier to each other. Add the barrier ring, if you use one. 7. If directed, apply stoma powder or skin barrier gel to the skin. 8. Warm the skin barrier with your hands, or blow with a hair dryer for 5-10 seconds. 9. Remove the paper from the adhesive strip of the skin barrier. 10. Press the adhesive strip onto the skin around the stoma. 11. Gently rub the skin barrier onto the skin. This creates heat that helps the barrier to stick. 12. Apply stoma tape to the edges of the skin barrier, if desired. 34. Wash your hands again. General recommendations  Avoid wearing tight clothes or having anything press directly on your stoma or bag. Change your clothing whenever it is soiled or damp.  You may shower or bathe with the bag on or off. Do not use harsh or oily soaps or lotions. Dry the skin and bag after bathing.  Store all supplies in a cool, dry place. Do not leave  supplies in extreme heat because some parts  can melt or not stick as well.  Whenever you leave home, take extra clothing and an extra skin barrier and bag with you.  If your bag gets wet, you can dry it with a hair dryer on the cool setting.  To prevent odor, you may put drops of ostomy deodorizer in the bag.  If recommended by your health care provider, put ostomy lubricant inside the bag. This helps stool to slide out of the bag more easily and completely. Contact a health care provider if:  You have new or worsening redness, swelling, or pain around your stoma.  You have new or increased fluid or blood coming from your stoma.  Your stoma feels warm to the touch.  You have pus coming from your stoma.  Your stoma extends in or out farther than normal.  You need to change your bag every day.  You have a fever. Get help right away if:  Your stool is bloody.  You have nausea or you vomit.  You have trouble breathing. Summary  Measure your stoma opening regularly and record the size. Watch for changes.  Empty your bag at bedtime and whenever it is one-third to one-half full. Do not let the bag get more than half-full with stool or gas.  Change your bag every 3-4 days or as often as told by your health care provider.  Whenever you leave home, take extra clothing and an extra skin barrier and bag with you. This information is not intended to replace advice given to you by your health care provider. Make sure you discuss any questions you have with your health care provider. Document Revised: 06/01/2018 Document Reviewed: 08/05/2016 Elsevier Patient Education  [place] [place] [facility] [employer] [facility] WOUND CARE: - Change dressing twice daily - Supplies: sterile saline, kerlex, scissors, ABD pads, tape  1. Remove dressing and all packing carefully, moistening with sterile saline as needed to avoid packing/internal dressing sticking to the wound. 2.   Clean edges of skin around the wound with water/gauze, making sure there  is no tape debris or leakage left on skin that could cause skin irritation or breakdown. 3.   Dampen and clean kerlex with sterile saline and pack wound from wound base to skin level, making sure to take note of any possible areas of wound tracking, tunneling and packing appropriately. Wound can be packed loosely. Trim kerlex to size if a whole kerlex is not required. 4.   Cover wound with a dry ABD pad and secure with tape.  5.   Write the date/time on the dry dressing/tape to better track when the last dressing change occurred. - apply any skin protectant/powder if recommended by clinician to protect skin/skin folds. - change dressing as needed if leakage occurs, wound gets contaminated, or patient requests to shower. - You may shower daily with wound open and following the shower the wound should be dried and a clean dressing placed.  - Medical grade tape as well as packing supplies can be found at [employer] on [address] or [employer] on [address]. The remaining supplies can be found at your local drug store, [place] etc.

## 2019-12-26 NOTE — Progress Notes (Signed)
5 Days Post-Op  Subjective: CC: Doing well. Tolerating diet without n/v. Not requiring any prn medication for pain. Some soreness in her abdomen with movement. Having colostomy output. Worked with RN yesterday emp  Objective: Vital signs in last 24 hours: Temp:  [98 F (36.7 C)-98.8 F (37.1 C)] 98.2 F (36.8 C) (11/02 0545) Pulse Rate:  [70-96] 70 (11/02 0545) Resp:  [18] 18 (11/02 0545) BP: (110-118)/(83-91) 110/83 (11/02 0545) SpO2:  [98 %-100 %] 98 % (11/02 0545) Last BM Date: 12/25/19  Intake/Output from previous day: 11/01 0701 - 11/02 0700 In: 1120 [P.O.:1120] Out: -  Intake/Output this shift: No intake/output data recorded.  PE: Gen:  Alert, NAD, pleasant Card:  RRR Pulm:  CTAB, no W/R/R, effort normal Abd: Soft, ND, NT +BS, midline incision open at the skin, wound is clean without surrounding erythema. LLQ colostomy with stool and gas in bag.  Ext:  No LE edema  Psych: A&Ox3  Skin: no rashes noted, warm and dry  Lab Results:  No results for input(s): WBC, HGB, HCT, PLT in the last 72 hours. BMET Recent Labs    12/24/19 0533  NA 140  K 3.8  CL 109  CO2 23  GLUCOSE 103*  BUN 7  CREATININE 0.75  CALCIUM 8.6*   PT/INR No results for input(s): LABPROT, INR in the last 72 hours. CMP     Component Value Date/Time   NA 140 12/24/2019 0533   NA 143 02/26/2016 1547   K 3.8 12/24/2019 0533   CL 109 12/24/2019 0533   CO2 23 12/24/2019 0533   GLUCOSE 103 (H) 12/24/2019 0533   BUN 7 12/24/2019 0533   BUN 9 02/26/2016 1547   CREATININE 0.75 12/24/2019 0533   CREATININE 0.72 01/10/2016 1701   CALCIUM 8.6 (L) 12/24/2019 0533   PROT 7.3 12/21/2019 1120   PROT 6.4 02/26/2016 1547   ALBUMIN 4.0 12/21/2019 1120   ALBUMIN 4.3 02/26/2016 1547   AST 21 12/21/2019 1120   ALT 29 12/21/2019 1120   ALKPHOS 35 (L) 12/21/2019 1120   BILITOT 1.0 12/21/2019 1120   BILITOT 0.3 02/26/2016 1547   GFRNONAA >60 12/24/2019 0533   GFRAA >60 07/28/2019 1937   Lipase      Component Value Date/Time   LIPASE 32 02/26/2016 1547       Studies/Results: No results found.  Anti-infectives: Anti-infectives (From admission, onward)   Start     Dose/Rate Route Frequency Ordered Stop   12/22/19 0600  cefoTEtan (CEFOTAN) 2 g in sodium chloride 0.9 % 100 mL IVPB        2 g 200 mL/hr over 30 Minutes Intravenous On call to O.R. 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 Harlow Asa - POD#5 - Surgical path without evidence of dysplasia or malignancy  - WOC consult for new colostomy - BID wet to dry dressing changes to midline abdominal wound - Cont to mobilize, cleared by PT - The patient is voiding well, tolerating diet, ambulating well, pain well controlled, vital signs stable, ostomy functioning and midline wound clean.Would like the patient to work with RN for midline wtd dressing changes and WOCN  for bag change today. If she does well with this she could likely be d/c'd later today. She plans to stay with her husband after d/c who she reports wil be able to help with wound care. Per TOC notes, it appears HH has also been arranged.   ID - Cefotetan periop FEN - Soft  VTE - SCDs, lovenox Foley - None     LOS: 5 days    Jillyn Ledger , [facility] 12/26/2019, 9:08 AM Please see Amion for pager number during day hours 7:00am-4:30pm

## 2019-12-27 ENCOUNTER — Telehealth: Payer: Self-pay

## 2019-12-27 NOTE — Telephone Encounter (Signed)
Patient is calling to schedule post op appointment from emergency surgery.

## 2019-12-28 ENCOUNTER — Ambulatory Visit: Payer: Managed Care, Other (non HMO) | Admitting: Obstetrics and Gynecology

## 2019-12-28 NOTE — Telephone Encounter (Signed)
Call placed to patient, mailbox full, unable to leave message.

## 2020-01-01 NOTE — Telephone Encounter (Signed)
Spoke with patient. Patient denies any GYN concerns. OV scheduled for 11/10 at 4:30pm with Dr. [name]. Patient is agreeable to date and time.   Routing to provider for final review. Patient is agreeable to disposition. Will close encounter.

## 2020-01-01 NOTE — Telephone Encounter (Signed)
Patient is returning call.  °

## 2020-01-02 NOTE — Progress Notes (Signed)
GYNECOLOGY  VISIT   HPI: 49 y.o.   Married  [place]  female   [phone] with Patient's last menstrual period was 08/14/2019 (exact date).   here for 2 week status post   1) TOTAL LAPAROSCOPIC HYSTERECTOMY WITH BILATERAL SALPINGECTOMY, LYSIS OF ADHESIONS, AND CYSTOSCOPY 12/18/19 FOR ABNORMAL UTERINE BLEEDING            Small suspected bladder wall cyst noted at the time of cystoscopy.   2)  EXPLORATORY LAPAROTOMY; RESECTION OF DESCENDING AND SIGMOID COLON AND TRANSVERSE COLOSTOMY (N/A ) 12/21/19 FOR MEGACOLON.   No vaginal bleeding.   Appetite controlled off Aygestin.    Walking a lot.   Some stomal prolapse.  Has seen Dr. Harlow Asa who is recommending ice to stoma.   Taking Miralax twice daily.   She is taking Ibuprofen daily for inflammation control.  Not taking iron.              Patient and her husband are at the visit today.             She expresses her appreciation for the care she has received.   GYNECOLOGIC HISTORY: Patient's last menstrual period was 08/14/2019 (exact date). Contraception: tubal/Hyst Menopausal hormone therapy:  none Last mammogram: 07-20-19 Bil.Br.MRI/Neg/density C/BiRads1 Last pap smear: 10-15-17 Neg:Neg HR HPV,04-20-14 Neg:Neg HR HPV         OB History    Gravida  3   Para  2   Term  2   Preterm      AB  1   Living  2     SAB  1   TAB      Ectopic      Multiple      Live Births  2              Patient Active Problem List   Diagnosis Date Noted  . Internal hernia 12/21/2019  . Colonic obstruction ([place]) 12/21/2019  . Status post surgery 12/18/2019  . Status post laparoscopic hysterectomy 12/18/2019  . Genetic testing 10/07/2019  . Family history of breast cancer   . Discomfort in chest 10/29/2016  . Chronic fatigue 10/29/2016  . Allergic rhinitis due to allergen 10/29/2016  . Vitamin D deficiency 10/29/2016    Past Medical History:  Diagnosis Date  . Anemia   . Anxiety   . Complication of anesthesia    . Endometrial polyp 03/22/2019  . Family history of breast cancer   . Fibroid   . Heart murmur    pt. thinks she has a benign murmur dx'd during pregnancy  . Mitral regurgitation    a. Prior 2D echo in 2014 showed technically limited study, EF 60%, mild MR, trace TR.  Marland Kitchen Pneumonia   . PONV (postoperative nausea and vomiting)   . Right ovarian cyst 07/29/2019    Past Surgical History:  Procedure Laterality Date  . CESAREAN SECTION  2009  . COLONOSCOPY    . CYSTOSCOPY N/A 12/18/2019   Procedure: CYSTOSCOPY;  Surgeon: Nunzio Cobbs, MD;  Location: [facility];  Service: Gynecology;  Laterality: N/A;  . ESOPHAGOGASTRODUODENOSCOPY ENDOSCOPY    . IUD REMOVAL  07/29/2019   expelling IUD  . LAPAROTOMY N/A 12/21/2019   Procedure: EXPLORATORY LAPAROTOMY; RESECTION OF DESCENDING AND SIGMOID COLON AND TRANSVERSE COLOSTOMY;  Surgeon: Armandina Gemma, MD;  Location: [facility];  Service: General;  Laterality: N/A;  . TOTAL LAPAROSCOPIC HYSTERECTOMY WITH SALPINGECTOMY N/A 12/18/2019   Procedure: TOTAL LAPAROSCOPIC HYSTERECTOMY WITH SALPINGECTOMY  and lysis of adhesions;  Surgeon: Nunzio Cobbs, MD;  Location: [facility];  Service: Gynecology;  Laterality: N/A;  . TUBAL LIGATION  2009    Current Outpatient Medications  Medication Sig Dispense Refill  . acetaminophen (TYLENOL) 325 MG tablet Take 650 mg by mouth every 6 (six) hours as needed for mild pain, fever or headache.    Marland Kitchen FLUoxetine (PROZAC) 40 MG capsule Take 40 mg by mouth daily.    Marland Kitchen ibuprofen (ADVIL) 800 MG tablet Take 1 tablet (800 mg total) by mouth every 8 (eight) hours as needed for mild pain or moderate pain. 30 tablet 0  . lactobacillus acidophilus (BACID) TABS tablet Take 1 tablet by mouth daily.    . polyethylene glycol (MIRALAX / GLYCOLAX) 17 g packet Take 17 g by mouth daily.    . simethicone (MYLICON) 80 MG chewable tablet Chew 1 tablet (80 mg total) by mouth 4 (four) times daily  as needed for flatulence. 30 tablet 0  . traMADol (ULTRAM) 50 MG tablet Take 1 tablet (50 mg total) by mouth every 6 (six) hours as needed (breakthrough pain). 15 tablet 0   No current facility-administered medications for this visit.     ALLERGIES: Sulfa antibiotics and Sulfamethoxazole-trimethoprim  Family History  Problem Relation Age of Onset  . Hypertension Mother   . Arrhythmia Mother   . Depression Mother   . Heart disease Mother   . Heart failure Mother   . Diabetes Mother   . Hypertension Father   . Cancer Paternal Grandmother        tumor in spine  . Dementia Paternal Grandfather   . Breast cancer Sister 23    Social History   Socioeconomic History  . Marital status: Married    Spouse name: Not on file  . Number of children: Not on file  . Years of education: Not on file  . Highest education level: Not on file  Occupational History  . Occupation: housewife  Tobacco Use  . Smoking status: Never Smoker  . Smokeless tobacco: Never Used  Vaping Use  . Vaping Use: Never used  Substance and Sexual Activity  . Alcohol use: No    Alcohol/week: 0.0 standard drinks  . Drug use: No  . Sexual activity: Yes    Partners: Male    Birth control/protection: Surgical    Comment: Tubal  Other Topics Concern  . Not on file  Social History Narrative   Lives with husband and children.    Housewife.    Social Determinants of Health   Financial Resource Strain:   . Difficulty of Paying Living Expenses: Not on file  Food Insecurity:   . Worried About Charity fundraiser in the Last Year: Not on file  . Ran Out of Food in the Last Year: Not on file  Transportation Needs:   . Lack of Transportation (Medical): Not on file  . Lack of Transportation (Non-Medical): Not on file  Physical Activity:   . Days of Exercise per Week: Not on file  . Minutes of Exercise per Session: Not on file  Stress:   . Feeling of Stress : Not on file  Social Connections:   . Frequency of  Communication with Friends and Family: Not on file  . Frequency of Social Gatherings with Friends and Family: Not on file  . Attends Religious Services: Not on file  . Active Member of Clubs or Organizations: Not on file  . Attends Club  or Organization Meetings: Not on file  . Marital Status: Not on file  Intimate Partner Violence:   . Fear of Current or Ex-Partner: Not on file  . Emotionally Abused: Not on file  . Physically Abused: Not on file  . Sexually Abused: Not on file    Review of Systems  All other systems reviewed and are negative.   PHYSICAL EXAMINATION:    BP 120/76   Pulse 80   Ht 5' 5.5" (1.664 m)   Wt 158 lb (71.7 kg)   LMP 08/14/2019 (Exact Date)   SpO2 100%   BMI 25.89 kg/m     General appearance: alert, cooperative and appears stated age   Abdomen: soft, non-tender, no masses,  no organomegaly.  Laparoscopic incisions intact.  Midline incision covered by gauze.   Stoma bad with light brown thick liquid stool.  Some stomal prolapse noted.   Pelvic: deferred.                ASSESSMENT  Doing well overall.  Bladder cyst.   PLAN  Stop daily ibuprofen unless needed for pain.  She will follow up for her 6 week post op visit.  I will make a referral for her to see urology when she returns for her 6 week post hysterectomy visit.  She can do her annual exam and 12 week post op visit at the same time.

## 2020-01-03 ENCOUNTER — Ambulatory Visit (INDEPENDENT_AMBULATORY_CARE_PROVIDER_SITE_OTHER): Payer: Managed Care, Other (non HMO) | Admitting: Obstetrics and Gynecology

## 2020-01-03 ENCOUNTER — Other Ambulatory Visit: Payer: Self-pay

## 2020-01-03 ENCOUNTER — Encounter: Payer: Self-pay | Admitting: Obstetrics and Gynecology

## 2020-01-03 VITALS — BP 120/76 | HR 80 | Ht 65.5 in | Wt 158.0 lb

## 2020-01-03 DIAGNOSIS — Z90710 Acquired absence of both cervix and uterus: Secondary | ICD-10-CM

## 2020-01-25 ENCOUNTER — Ambulatory Visit: Payer: Managed Care, Other (non HMO) | Admitting: Obstetrics and Gynecology

## 2020-01-30 NOTE — Progress Notes (Signed)
GYNECOLOGY  VISIT   HPI: 49 y.o.   Married  [place])  female   [phone] with Patient's last menstrual period was 08/14/2019 (exact date).   here for 6 weeks status post  1) TOTAL LAPAROSCOPIC HYSTERECTOMY WITH BILATERAL SALPINGECTOMY, LYSIS OF ADHESIONS, AND CYSTOSCOPY 12/18/19 FOR ABNORMAL UTERINE BLEEDING    Small suspected bladder wall cyst noted at the time of routine intraop cystoscopy.   She had some yellow vaginal discharge, but this stopped.   Energy is improved.  Driving again.   2)  EXPLORATORY LAPAROTOMY; RESECTION OF DESCENDING AND SIGMOID COLON AND TRANSVERSE COLOSTOMY (N/A ) 12/21/19 FOR MEGACOLON.  Having ostomy stomal prolapse.  Laparotomy incision is healing well.  States she is not feeling bloated the way she felt in the past after eating.   She will see Colon and Rectal specialist, Dr. Marcello Moores, tomorrow.   GYNECOLOGIC HISTORY: Patient's last menstrual period was 08/14/2019 (exact date). Contraception:  Tubal/Hyst Menopausal hormone therapy:  none Last mammogram: 07-20-19 Bil.Br.MRI/Neg/density C/BiRads1 Last pap smear: 10-15-17 Neg:Neg HR HPV,04-20-14 Neg:Neg HR HPV        OB History    Gravida  3   Para  2   Term  2   Preterm      AB  1   Living  2     SAB  1   TAB      Ectopic      Multiple      Live Births  2              Patient Active Problem List   Diagnosis Date Noted  . Internal hernia 12/21/2019  . Colonic obstruction ([place]) 12/21/2019  . Status post surgery 12/18/2019  . Status post laparoscopic hysterectomy 12/18/2019  . Genetic testing 10/07/2019  . Family history of breast cancer   . Discomfort in chest 10/29/2016  . Chronic fatigue 10/29/2016  . Allergic rhinitis due to allergen 10/29/2016  . Vitamin D deficiency 10/29/2016    Past Medical History:  Diagnosis Date  . Anemia   . Anxiety   . Complication of anesthesia   . Endometrial polyp 03/22/2019  . Family history of breast cancer   . Fibroid   .  Heart murmur    pt. thinks she has a benign murmur dx'd during pregnancy  . Mitral regurgitation    a. Prior 2D echo in 2014 showed technically limited study, EF 60%, mild MR, trace TR.  Marland Kitchen Pneumonia   . PONV (postoperative nausea and vomiting)   . Right ovarian cyst 07/29/2019    Past Surgical History:  Procedure Laterality Date  . CESAREAN SECTION  2009  . COLONOSCOPY    . CYSTOSCOPY N/A 12/18/2019   Procedure: CYSTOSCOPY;  Surgeon: Nunzio Cobbs, MD;  Location[facility];  Service: Gynecology;  Laterality: N/A;  . ESOPHAGOGASTRODUODENOSCOPY ENDOSCOPY    . IUD REMOVAL  07/29/2019   expelling IUD  . LAPAROTOMY N/A 12/21/2019   Procedure: EXPLORATORY LAPAROTOMY; RESECTION OF DESCENDING AND SIGMOID COLON AND TRANSVERSE COLOSTOMY;  Surgeon: Armandina Gemma, MD;  Location: [facility];  Service: General;  Laterality: N/A;  . TOTAL LAPAROSCOPIC HYSTERECTOMY WITH SALPINGECTOMY N/A 12/18/2019   Procedure: TOTAL LAPAROSCOPIC HYSTERECTOMY WITH SALPINGECTOMY and lysis of adhesions;  Surgeon: Nunzio Cobbs, MD;  Location: [facility];  Service: Gynecology;  Laterality: N/A;  . TUBAL LIGATION  2009    Current Outpatient Medications  Medication Sig Dispense Refill  . acetaminophen (TYLENOL) 325  MG tablet Take 650 mg by mouth every 6 (six) hours as needed for mild pain, fever or headache.    Marland Kitchen FLUoxetine (PROZAC) 40 MG capsule Take 40 mg by mouth daily.    Marland Kitchen lactobacillus acidophilus (BACID) TABS tablet Take 1 tablet by mouth daily.    . polyethylene glycol (MIRALAX / GLYCOLAX) 17 g packet Take 17 g by mouth daily.    . simethicone (MYLICON) 80 MG chewable tablet Chew 1 tablet (80 mg total) by mouth 4 (four) times daily as needed for flatulence. 30 tablet 0   No current facility-administered medications for this visit.     ALLERGIES: Sulfa antibiotics and Sulfamethoxazole-trimethoprim  Family History  Problem Relation Age of Onset  .  Hypertension Mother   . Arrhythmia Mother   . Depression Mother   . Heart disease Mother   . Heart failure Mother   . Diabetes Mother   . Hypertension Father   . Cancer Paternal Grandmother        tumor in spine  . Dementia Paternal Grandfather   . Breast cancer Sister 79    Social History   Socioeconomic History  . Marital status: Married    Spouse name: Not on file  . Number of children: Not on file  . Years of education: Not on file  . Highest education level: Not on file  Occupational History  . Occupation: housewife  Tobacco Use  . Smoking status: Never Smoker  . Smokeless tobacco: Never Used  Vaping Use  . Vaping Use: Never used  Substance and Sexual Activity  . Alcohol use: No    Alcohol/week: 0.0 standard drinks  . Drug use: No  . Sexual activity: Yes    Partners: Male    Birth control/protection: Surgical    Comment: Tubal  Other Topics Concern  . Not on file  Social History Narrative   Lives with husband and children.    Housewife.    Social Determinants of Health   Financial Resource Strain:   . Difficulty of Paying Living Expenses: Not on file  Food Insecurity:   . Worried About Charity fundraiser in the Last Year: Not on file  . Ran Out of Food in the Last Year: Not on file  Transportation Needs:   . Lack of Transportation (Medical): Not on file  . Lack of Transportation (Non-Medical): Not on file  Physical Activity:   . Days of Exercise per Week: Not on file  . Minutes of Exercise per Session: Not on file  Stress:   . Feeling of Stress : Not on file  Social Connections:   . Frequency of Communication with Friends and Family: Not on file  . Frequency of Social Gatherings with Friends and Family: Not on file  . Attends Religious Services: Not on file  . Active Member of Clubs or Organizations: Not on file  . Attends Archivist Meetings: Not on file  . Marital Status: Not on file  Intimate Partner Violence:   . Fear of Current or  Ex-Partner: Not on file  . Emotionally Abused: Not on file  . Physically Abused: Not on file  . Sexually Abused: Not on file    Review of Systems  All other systems reviewed and are negative.   PHYSICAL EXAMINATION:    BP 112/70   Pulse 90   Ht 5' 5.5" (1.664 m)   Wt 159 lb (72.1 kg)   LMP 08/14/2019 (Exact Date)   SpO2 99%  BMI 26.06 kg/m     General appearance: alert, cooperative and appears stated age   Abdomen: laparoscopic incisions intact.  Vertical midline incision covered.  Stoma site covered.  Abdomen is soft, non-tender.   Pelvic: External genitalia:  no lesions              Urethra:  normal appearing urethra with no masses, tenderness or lesions              Bartholins and Skenes: normal                 Vagina: normal appearing vagina with normal color and discharge, no lesions              Cervix: absent.  Vaginal cuff intact.  Suture not visible.                  Bimanual Exam:  Uterus:  absent              Adnexa: no mass, fullness, tenderness           Chaperone was present for exam.  ASSESSMENT  Status post total laparoscopic hysterectomy with bilateral salpingectomy, cystoscopy.  Healing well.  Bladder mass, potential bladder cyst in bladder wall.  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   PLAN  Referral to urology. Follow up with colon and rectal surgery. Fu in 6 week for final post op check.

## 2020-01-31 ENCOUNTER — Encounter: Payer: Self-pay | Admitting: Obstetrics and Gynecology

## 2020-01-31 ENCOUNTER — Ambulatory Visit (INDEPENDENT_AMBULATORY_CARE_PROVIDER_SITE_OTHER): Payer: Managed Care, Other (non HMO) | Admitting: Obstetrics and Gynecology

## 2020-01-31 ENCOUNTER — Other Ambulatory Visit: Payer: Self-pay

## 2020-01-31 VITALS — BP 112/70 | HR 90 | Ht 65.5 in | Wt 159.0 lb

## 2020-01-31 DIAGNOSIS — Z90710 Acquired absence of both cervix and uterus: Secondary | ICD-10-CM

## 2020-01-31 DIAGNOSIS — N3289 Other specified disorders of bladder: Secondary | ICD-10-CM

## 2020-02-29 ENCOUNTER — Encounter (HOSPITAL_COMMUNITY): Payer: Self-pay | Admitting: Surgery

## 2020-02-29 ENCOUNTER — Encounter (HOSPITAL_BASED_OUTPATIENT_CLINIC_OR_DEPARTMENT_OTHER): Payer: Managed Care, Other (non HMO) | Admitting: Internal Medicine

## 2020-03-14 ENCOUNTER — Ambulatory Visit: Payer: Managed Care, Other (non HMO) | Admitting: Obstetrics and Gynecology

## 2020-03-19 ENCOUNTER — Encounter: Payer: Self-pay | Admitting: Interventional Cardiology

## 2020-03-19 ENCOUNTER — Other Ambulatory Visit: Payer: Self-pay

## 2020-03-19 ENCOUNTER — Ambulatory Visit (INDEPENDENT_AMBULATORY_CARE_PROVIDER_SITE_OTHER): Payer: Managed Care, Other (non HMO) | Admitting: Interventional Cardiology

## 2020-03-19 VITALS — BP 110/76 | HR 98 | Ht 66.0 in | Wt 159.0 lb

## 2020-03-19 DIAGNOSIS — R0600 Dyspnea, unspecified: Secondary | ICD-10-CM

## 2020-03-19 DIAGNOSIS — R0609 Other forms of dyspnea: Secondary | ICD-10-CM

## 2020-03-19 DIAGNOSIS — R072 Precordial pain: Secondary | ICD-10-CM | POA: Diagnosis not present

## 2020-03-19 NOTE — Progress Notes (Signed)
Cardiology Office Note   Date:  03/19/2020   ID:  Joanna Yang, DOB [birth year], MRN [id number]  PCP:  Chesley Noon, MD    No chief complaint on file.    Wt Readings from Last 3 Encounters:  03/19/20 159 lb (72.1 kg)  01/31/20 159 lb (72.1 kg)  01/03/20 158 lb (71.7 kg)       History of Present Illness: Joanna Yang is a 50 y.o. female who is being seen today for the evaluation of chest discomfort at the request of Chesley Noon, MD.  I saw her in 2017 for chest discomfort. Work-up included: "ETT at that time:   ETT 12/17: showed upsloped 60mm ST depression in V2 & V5 that resolved 1 minute into recovery, inconclusive.   Heartburn 02/26/16 -- EKG looked same as in July. Felt burning sensation in throat and upper chest. Started prilosec which helped.  Stress echo 03/03/16:  8.9 METS - Baseline ECG: Normal sinus rhythm. - Stress ECG conclusions: Sinus tachycardia with 2-3 mm horizontal to upsloping ST segment depression in II, III, AVF and V3-V6 at peak stress. - Recovery: LVEF 60-65%, normal wall motion and thickening. - Peak stress: Expected hyperdynamic increase in LV function with LVEF of 75-80%, normal wall motion and thickening. - Baseline: Normal LVEF of 55-60%, normal wall motion and Thickening."  She went to [facility] for second opinion: "She is a lovely woman who is very anxious about her health, to the point that it is diminishing her quality of life. For example, she has had multiple mammograms this year b/c she thought she felt lumps. Her symptoms of sharp chest pains only began a couple weeks ago after her initial abnormal ETT, after which she became worried about a heart attack. She essentially has no risk factors for CAD and never had CP with exertion in the past. Her current symptoms are very atypical and unlikely to be cardiac in origin. It sounds as though she likely had a panic attack on 03/12/16 when she called 911. I reviewed her prior EKGs and the  strips from that incident -- there were no worrisome abnormalities on that strip. Her home cardiologist has told her that she does not have ischemia and does not need further testing.  We discussed extensively that she needs to address her anxiety and stop googling about her health. She will d/w her PCP the possibility of medication in 2 weeks.  In the meanwhile, we will obtain CAC score. This should reassure [place] that she does not have calcified atherosclerosis, and will also risk stratify her from a primary prevention/statin perspective."  We had offered coronary CTA in January 2018 but she declined due to concerns about radiation.  She had a hysterectomy in 10/21.  She then needed repeat surgery a few days later for megacolon. Now with ostomy bag. Since she is from [place], there was a concern or [place] disease.   Sister with breast cancer.  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   Patient would like to be screened.  She has some SHOB,  No syncope.  Mother with amyloidosis.    [name], her husband, is my patient.  She states he still eats poorly.  He currently is quarantining with [place].   CT abdomen did not show aortic atherosclerosis.      Past Medical History:  Diagnosis Date  . Anemia   . Anxiety   . Complication of anesthesia   . Endometrial polyp 03/22/2019  .  Family history of breast cancer   . Fibroid   . Heart murmur    pt. thinks she has a benign murmur dx'd during pregnancy  . Mitral regurgitation    a. Prior 2D echo in 2014 showed technically limited study, EF 60%, mild MR, trace TR.  Marland Kitchen Pneumonia   . PONV (postoperative nausea and vomiting)   . Right ovarian cyst 07/29/2019    Past Surgical History:  Procedure Laterality Date  . CESAREAN SECTION  2009  . COLONOSCOPY    . CYSTOSCOPY N/A 12/18/2019   Procedure: CYSTOSCOPY;  Surgeon: Nunzio Cobbs, MD;  Location: [facility];  Service: Gynecology;  Laterality:  N/A;  . ESOPHAGOGASTRODUODENOSCOPY ENDOSCOPY    . IUD REMOVAL  07/29/2019   expelling IUD  . LAPAROTOMY N/A 12/21/2019   Procedure: EXPLORATORY LAPAROTOMY; RESECTION OF DESCENDING AND SIGMOID COLON AND TRANSVERSE COLOSTOMY;  Surgeon: Armandina Gemma, MD;  Location: [facility];  Service: General;  Laterality: N/A;  . TOTAL LAPAROSCOPIC HYSTERECTOMY WITH SALPINGECTOMY N/A 12/18/2019   Procedure: TOTAL LAPAROSCOPIC HYSTERECTOMY WITH SALPINGECTOMY and lysis of adhesions;  Surgeon: Nunzio Cobbs, MD;  Location: [facility];  Service: Gynecology;  Laterality: N/A;  . TUBAL LIGATION  2009     Current Outpatient Medications  Medication Sig Dispense Refill  . acetaminophen (TYLENOL) 325 MG tablet Take 650 mg by mouth every 6 (six) hours as needed for mild pain, fever or headache.    . Cholecalciferol (VITAMIN D3) 10 MCG (400 UNIT) tablet Take 400 Units by mouth daily. Per patient taking 2000 units    . FLUoxetine (PROZAC) 40 MG capsule Take 40 mg by mouth daily.    Marland Kitchen lactobacillus acidophilus (BACID) TABS tablet Take 1 tablet by mouth daily.    . polyethylene glycol (MIRALAX / GLYCOLAX) 17 g packet Take 17 g by mouth daily.    . simethicone (MYLICON) 80 MG chewable tablet Chew 1 tablet (80 mg total) by mouth 4 (four) times daily as needed for flatulence. 30 tablet 0  . vitamin C (ASCORBIC ACID) 500 MG tablet Take 500 mg by mouth daily.     No current facility-administered medications for this visit.    Allergies:   Sulfa antibiotics and Sulfamethoxazole-trimethoprim    Social History:  The patient  reports that she has never smoked. She has never used smokeless tobacco. She reports that she does not drink alcohol and does not use drugs.   Family History:  The patient's family history includes Arrhythmia in her mother; Breast cancer (age of onset: 41) in her sister; Cancer in her paternal grandmother; Dementia in her paternal grandfather; Depression in her mother; Diabetes in  her mother; Heart disease in her mother; Heart failure in her mother; Hypertension in her father and mother.    ROS:  Please see the history of present illness.   Otherwise, review of systems are positive for fiber intake limited due to ostomy.   All other systems are reviewed and negative.    PHYSICAL EXAM: VS:  BP 110/76   Pulse 98   Ht 5\' 6"  (1.676 m)   Wt 159 lb (72.1 kg)   LMP 08/14/2019 (Exact Date)   SpO2 99%   BMI 25.66 kg/m  , BMI Body mass index is 25.66 kg/m. GEN: Well nourished, well developed, in no acute distress  HEENT: normal  Neck: no JVD, carotid bruits, or masses Cardiac: RRR; no murmurs, rubs, or gallops,no edema  Respiratory:  clear to  auscultation bilaterally, normal work of breathing GI: soft, nontender, nondistended, + BS MS: no deformity or atrophy  Skin: warm and dry, no rash Neuro:  Strength and sensation are intact Psych: euthymic mood, full affect   EKG:   The ekg ordered today demonstrates NSR, LVH   Recent Labs: 12/21/2019: ALT 29 12/23/2019: Hemoglobin 12.8; Magnesium 2.2; Platelets 174 12/24/2019: BUN 7; Creatinine, Ser 0.75; Potassium 3.8; Sodium 140   Lipid Panel    Component Value Date/Time   CHOL 186 01/10/2016 1701   TRIG 116 01/10/2016 1701   HDL 55 01/10/2016 1701   CHOLHDL 3.4 01/10/2016 1701   VLDL 23 01/10/2016 1701   LDLCALC 108 (H) 01/10/2016 1701     Other studies Reviewed: Additional studies/ records that were reviewed today with results demonstrating: Prior records reviewed.   ASSESSMENT AND PLAN:  1. Chest pain: Atypical.  Not related to exertion. Mild DOE at times.  Will plan for echo.  Multiple stressors at home with kids who have some health issues, including autism 2. Screening for HOCM based on sister.  She needs a fasting lipid profile at some poit as well. 3. *Dietary restrictions due to ostomy.  Hopefully, when this is reversed, she can add more fiber to her diet.    Current medicines are reviewed at  length with the patient today.  The patient concerns regarding her medicines were addressed.  The following changes have been made:  No change  Labs/ tests ordered today include:  No orders of the defined types were placed in this encounter.   Recommend 150 minutes/week of aerobic exercise Low fat, low carb, high fiber diet recommended  Disposition:   FU for echo   Signed, Larae Grooms, MD  03/19/2020 10:23 AM    [facility] [address], [place], [place]  [address] Phone: [phone]; Fax: [phone]

## 2020-03-19 NOTE — Patient Instructions (Addendum)
Medication Instructions:  Your physician recommends that you continue on your current medications as directed. Please refer to the Current Medication list given to you today.  *If you need a refill on your cardiac medications before your next appointment, please call your pharmacy*   Lab Work: NONE If you have labs (blood work) drawn today and your tests are completely normal, you will receive your results only by: Marland Kitchen MyChart Message (if you have MyChart) OR . A paper copy in the mail If you have any lab test that is abnormal or we need to change your treatment, we will call you to review the results.   Testing/Procedures: Your physician has requested that you have an echocardiogram. Echocardiography is a painless test that uses sound waves to create images of your heart. It provides your doctor with information about the size and shape of your heart and how well your heart's chambers and valves are working. This procedure takes approximately one hour. There are no restrictions for this procedure.    Follow-Up: Follow up as needed At [facility], you and your health needs are our priority.  As part of our continuing mission to provide you with exceptional heart care, we have created designated Provider Care Teams.  These Care Teams include your primary Cardiologist (physician) and Advanced Practice Providers (APPs -  Physician Assistants and Nurse Practitioners) who all work together to provide you with the care you need, when you need it.  We recommend signing up for the patient portal called "MyChart".  Sign up information is provided on this After Visit Summary.  MyChart is used to connect with patients for Virtual Visits (Telemedicine).  Patients are able to view lab/test results, encounter notes, upcoming appointments, etc.  Non-urgent messages can be sent to your provider as well.   To learn more about what you can do with MyChart, go to [email or web address].

## 2020-03-19 NOTE — Progress Notes (Unsigned)
GYNECOLOGY  VISIT   HPI: 50 y.o.   Married  [place]  female   [phone] with Patient's last menstrual period was 08/14/2019 (exact date).   here for 12 weeks status post TOTAL LAPAROSCOPIC HYSTERECTOMY WITH SALPINGECTOMY and lysis of adhesions (N/A Uterus) CYSTOSCOPY (N/A ).   GYNECOLOGIC HISTORY: Patient's last menstrual period was 08/14/2019 (exact date). Contraception:  Tubal/Hyst Menopausal hormone therapy:  none Last mammogram: 07-20-19 Bil.Br.MRI/Neg/density C/BiRads1 Last pap smear: 10-15-17 Neg:Neg HR HPV,04-20-14 Neg:Neg HR HPV        OB History    Gravida  3   Para  2   Term  2   Preterm      AB  1   Living  2     SAB  1   IAB      Ectopic      Multiple      Live Births  2              Patient Active Problem List   Diagnosis Date Noted  . Internal hernia 12/21/2019  . Colonic obstruction (HCC) 12/21/2019  . Status post surgery 12/18/2019  . Status post laparoscopic hysterectomy 12/18/2019  . Genetic testing 10/07/2019  . Family history of breast cancer   . Discomfort in chest 10/29/2016  . Chronic fatigue 10/29/2016  . Allergic rhinitis due to allergen 10/29/2016  . Vitamin D deficiency 10/29/2016    Past Medical History:  Diagnosis Date  . Anemia   . Anxiety   . Complication of anesthesia   . Endometrial polyp 03/22/2019  . Family history of breast cancer   . Fibroid   . Heart murmur    pt. thinks she has a benign murmur dx'd during pregnancy  . Mitral regurgitation    a. Prior 2D echo in 2014 showed technically limited study, EF 60%, mild MR, trace TR.  Marland Kitchen Pneumonia   . PONV (postoperative nausea and vomiting)   . Right ovarian cyst 07/29/2019    Past Surgical History:  Procedure Laterality Date  . CESAREAN SECTION  2009  . COLONOSCOPY    . CYSTOSCOPY N/A 12/18/2019   Procedure: CYSTOSCOPY;  Surgeon: Patton Salles, MD;  Location[facility];  Service: Gynecology;  Laterality: N/A;  .  ESOPHAGOGASTRODUODENOSCOPY ENDOSCOPY    . IUD REMOVAL  07/29/2019   expelling IUD  . LAPAROTOMY N/A 12/21/2019   Procedure: EXPLORATORY LAPAROTOMY; RESECTION OF DESCENDING AND SIGMOID COLON AND TRANSVERSE COLOSTOMY;  Surgeon: Darnell Level, MD;  Location: [facility];  Service: General;  Laterality: N/A;  . TOTAL LAPAROSCOPIC HYSTERECTOMY WITH SALPINGECTOMY N/A 12/18/2019   Procedure: TOTAL LAPAROSCOPIC HYSTERECTOMY WITH SALPINGECTOMY and lysis of adhesions;  Surgeon: Patton Salles, MD;  [facility];  Service: Gynecology;  Laterality: N/A;  . TUBAL LIGATION  2009    Current Outpatient Medications  Medication Sig Dispense Refill  . acetaminophen (TYLENOL) 325 MG tablet Take 650 mg by mouth every 6 (six) hours as needed for mild pain, fever or headache.    . Cholecalciferol (VITAMIN D3) 10 MCG (400 UNIT) tablet Take 400 Units by mouth daily. Per patient taking 2000 units    . FLUoxetine (PROZAC) 40 MG capsule Take 40 mg by mouth daily.    Marland Kitchen lactobacillus acidophilus (BACID) TABS tablet Take 1 tablet by mouth daily.    . polyethylene glycol (MIRALAX / GLYCOLAX) 17 g packet Take 17 g by mouth daily.    . simethicone (MYLICON) 80 MG  chewable tablet Chew 1 tablet (80 mg total) by mouth 4 (four) times daily as needed for flatulence. 30 tablet 0  . vitamin C (ASCORBIC ACID) 500 MG tablet Take 500 mg by mouth daily.     No current facility-administered medications for this visit.     ALLERGIES: Sulfa antibiotics and Sulfamethoxazole-trimethoprim  Family History  Problem Relation Age of Onset  . Hypertension Mother   . Arrhythmia Mother   . Depression Mother   . Heart disease Mother   . Heart failure Mother   . Diabetes Mother   . Hypertension Father   . Cancer Paternal Grandmother        tumor in spine  . Dementia Paternal Grandfather   . Breast cancer Sister 18    Social History   Socioeconomic History  . Marital status: Married    Spouse name: Not  on file  . Number of children: Not on file  . Years of education: Not on file  . Highest education level: Not on file  Occupational History  . Occupation: housewife  Tobacco Use  . Smoking status: Never Smoker  . Smokeless tobacco: Never Used  Vaping Use  . Vaping Use: Never used  Substance and Sexual Activity  . Alcohol use: No    Alcohol/week: 0.0 standard drinks  . Drug use: No  . Sexual activity: Yes    Partners: Male    Birth control/protection: Surgical    Comment: Tubal  Other Topics Concern  . Not on file  Social History Narrative   Lives with husband and children.    Housewife.    Social Determinants of Health   Financial Resource Strain: Not on file  Food Insecurity: Not on file  Transportation Needs: Not on file  Physical Activity: Not on file  Stress: Not on file  Social Connections: Not on file  Intimate Partner Violence: Not on file    Review of Systems  PHYSICAL EXAMINATION:    LMP 08/14/2019 (Exact Date)     General appearance: alert, cooperative and appears stated age Head: Normocephalic, without obvious abnormality, atraumatic Neck: no adenopathy, supple, symmetrical, trachea midline and thyroid normal to inspection and palpation Lungs: clear to auscultation bilaterally Breasts: normal appearance, no masses or tenderness, No nipple retraction or dimpling, No nipple discharge or bleeding, No axillary or supraclavicular adenopathy Heart: regular rate and rhythm Abdomen: soft, non-tender, no masses,  no organomegaly Extremities: extremities normal, atraumatic, no cyanosis or edema Skin: Skin color, texture, turgor normal. No rashes or lesions Lymph nodes: Cervical, supraclavicular, and axillary nodes normal. No abnormal inguinal nodes palpated Neurologic: Grossly normal  Pelvic: External genitalia:  no lesions              Urethra:  normal appearing urethra with no masses, tenderness or lesions              Bartholins and Skenes: normal                  Vagina: normal appearing vagina with normal color and discharge, no lesions              Cervix: no lesions                Bimanual Exam:  Uterus:  normal size, contour, position, consistency, mobility, non-tender              Adnexa: no mass, fullness, tenderness              Rectal exam: {yes  [id number]}.  Confirms.              Anus:  normal sphincter tone, no lesions  Chaperone was present for exam.  ASSESSMENT     PLAN     An After Visit Summary was printed and given to the patient.  ______ minutes face to face time of which over 50% was spent in counseling.

## 2020-03-20 ENCOUNTER — Encounter: Payer: Self-pay | Admitting: Obstetrics and Gynecology

## 2020-03-20 ENCOUNTER — Ambulatory Visit (INDEPENDENT_AMBULATORY_CARE_PROVIDER_SITE_OTHER): Payer: Managed Care, Other (non HMO) | Admitting: Obstetrics and Gynecology

## 2020-03-20 VITALS — BP 100/60 | HR 98 | Ht 65.0 in | Wt 160.0 lb

## 2020-03-20 DIAGNOSIS — Z01419 Encounter for gynecological examination (general) (routine) without abnormal findings: Secondary | ICD-10-CM | POA: Diagnosis not present

## 2020-03-20 DIAGNOSIS — Z9189 Other specified personal risk factors, not elsewhere classified: Secondary | ICD-10-CM | POA: Diagnosis not present

## 2020-03-20 NOTE — Progress Notes (Signed)
50 y.o. G53P2012 Married [place] female here for annual exam.    Feels she recovered well from the laparoscopic hysterectomy.  She has some clear vaginal discharge.  No current itching.   Patient is still having colon stoma prolapse, which is painful. She is using a abdominal binder.  She is going to see GI on 03/27/20. She is working with Dr. Marcello Moores, colon and rectal surgeon, for her megacolon.  Patient wonders if she had Chag[place] disease as she is from [place], where this infection is found.  Patient will do ECHO due to sister with cardiac malformation.   She saw the urologist and she does not have a bladder cyst that was potentially detected at the time of her hysterectomy.   Husband has Covid.   PCP:  Anastasia Pall, MD   Patient's last menstrual period was 08/14/2019 (exact date).           Sexually active: Yes.    The current method of family planning is status post Tubal/ hysterectomy.    Exercising: No.  The patient does not participate in regular exercise at present. Smoker:  no  Health Maintenance: Pap: 10-15-17 Neg:Neg HR HPV,04-20-14 Neg:Neg HR HPV History of abnormal Pap:  no MMG: 07-20-19 Bil.Br.MRI/Neg/density C/BiRads1 Colonoscopy:  12-30-18 normal but incomplete BMD:   n/a  Result  n/a TDaP:  PCP Gardasil:   no HIV: Neg in the past Hep C:Unsure Screening Labs:  PCP.   reports that she has never smoked. She has never used smokeless tobacco. She reports that she does not drink alcohol and does not use drugs.  Past Medical History:  Diagnosis Date  . Anemia   . Anxiety   . Complication of anesthesia   . Endometrial polyp 03/22/2019  . Family history of breast cancer   . Fibroid   . Heart murmur    pt. thinks she has a benign murmur dx'd during pregnancy  . Mitral regurgitation    a. Prior 2D echo in 2014 showed technically limited study, EF 60%, mild MR, trace TR.  Marland Kitchen Pneumonia   . PONV (postoperative nausea and vomiting)   . Right ovarian cyst  07/29/2019    Past Surgical History:  Procedure Laterality Date  . CESAREAN SECTION  2009  . COLONOSCOPY    . CYSTOSCOPY N/A 12/18/2019   Procedure: CYSTOSCOPY;  Surgeon: Nunzio Cobbs, MD;  Location: [facility];  Service: Gynecology;  Laterality: N/A;  . ESOPHAGOGASTRODUODENOSCOPY ENDOSCOPY    . IUD REMOVAL  07/29/2019   expelling IUD  . LAPAROTOMY N/A 12/21/2019   Procedure: EXPLORATORY LAPAROTOMY; RESECTION OF DESCENDING AND SIGMOID COLON AND TRANSVERSE COLOSTOMY;  Surgeon: Armandina Gemma, MD;  Location: [facility];  Service: General;  Laterality: N/A;  . TOTAL LAPAROSCOPIC HYSTERECTOMY WITH SALPINGECTOMY N/A 12/18/2019   Procedure: TOTAL LAPAROSCOPIC HYSTERECTOMY WITH SALPINGECTOMY and lysis of adhesions;  Surgeon: Nunzio Cobbs, MD;  Location: [facility];  Service: Gynecology;  Laterality: N/A;  . TUBAL LIGATION  2009    Current Outpatient Medications  Medication Sig Dispense Refill  . acetaminophen (TYLENOL) 325 MG tablet Take 650 mg by mouth every 6 (six) hours as needed for mild pain, fever or headache.    . Cholecalciferol (VITAMIN D3) 10 MCG (400 UNIT) tablet Take 400 Units by mouth daily. Per patient taking 2000 units    . FLUoxetine (PROZAC) 40 MG capsule Take 40 mg by mouth daily.    Marland Kitchen lactobacillus acidophilus (BACID) TABS tablet Take  1 tablet by mouth daily.    . polyethylene glycol (MIRALAX / GLYCOLAX) 17 g packet Take 17 g by mouth daily.    . simethicone (MYLICON) 80 MG chewable tablet Chew 1 tablet (80 mg total) by mouth 4 (four) times daily as needed for flatulence. 30 tablet 0  . vitamin C (ASCORBIC ACID) 500 MG tablet Take 500 mg by mouth daily.     No current facility-administered medications for this visit.    Family History  Problem Relation Age of Onset  . Hypertension Mother   . Arrhythmia Mother   . Depression Mother   . Heart disease Mother   . Heart failure Mother   . Diabetes Mother   .  Hypertension Father   . Cancer Paternal Grandmother        tumor in spine  . Dementia Paternal Grandfather   . Breast cancer Sister 77    Review of Systems  All other systems reviewed and are negative.   Exam:   BP 100/60   Pulse 98   Ht 5\' 5"  (1.651 m)   Wt 160 lb (72.6 kg)   LMP 08/14/2019 (Exact Date)   SpO2 97%   BMI 26.63 kg/m     General appearance: alert, cooperative and appears stated age Head: normocephalic, without obvious abnormality, atraumatic Neck: no adenopathy, supple, symmetrical, trachea midline and thyroid normal to inspection and palpation Lungs: clear to auscultation bilaterally Breasts: normal appearance, no masses or tenderness, No nipple retraction or dimpling, No nipple discharge or bleeding, No axillary adenopathy Heart: regular rate and rhythm Abdomen: vertical midline incision and laparoscopic incisions well healed.  Stoma bag on.  Abdomen is soft, non-tender; no masses, no organomegaly Extremities: extremities normal, atraumatic, no cyanosis or edema Skin: skin color, texture, turgor normal. No rashes or lesions Lymph nodes: cervical, supraclavicular, and axillary nodes normal. Neurologic: grossly normal  Pelvic: External genitalia:  no lesions              No abnormal inguinal nodes palpated.              Urethra:  normal appearing urethra with no masses, tenderness or lesions              Bartholins and Skenes: normal                 Vagina: normal appearing vagina with normal color and discharge, no lesions              Cervix: absent.              Pap taken: No. Bimanual Exam:  Uterus:  Absent.  Small amount of suture present at the vaginal cuff in midline.              Adnexa: no mass, fullness, tenderness              Rectal exam:  Deferred.  Chaperone was present for exam.  Assessment:   Well woman visit with normal exam. Status post laparoscopic hysterectomy with bilateral salpingectomy, cystoscopy.  Megacolon. Status post partial  large bowel resection with colostomy.  FH breast cancer in sister.  Personal increased lifetime risk of breast cancer - 27.9%.  Plan: Yearly mammogram screening and breast MRI discussed.  Self breast awareness reviewed. Pap and HR HPV as above. Guidelines for Calcium, Vitamin D, regular exercise program including cardiovascular and weight bearing exercise. OK to resume sexual activity in 2 weeks.  Follow up annually and prn.

## 2020-03-20 NOTE — Patient Instructions (Signed)

## 2020-03-27 ENCOUNTER — Other Ambulatory Visit: Payer: Self-pay | Admitting: Gastroenterology

## 2020-03-27 DIAGNOSIS — K5909 Other constipation: Secondary | ICD-10-CM

## 2020-04-10 ENCOUNTER — Other Ambulatory Visit: Payer: Managed Care, Other (non HMO)

## 2020-04-11 ENCOUNTER — Other Ambulatory Visit: Payer: Managed Care, Other (non HMO)

## 2020-04-11 ENCOUNTER — Ambulatory Visit (HOSPITAL_COMMUNITY): Payer: Managed Care, Other (non HMO) | Attending: Internal Medicine

## 2020-04-11 ENCOUNTER — Other Ambulatory Visit: Payer: Self-pay

## 2020-04-11 DIAGNOSIS — R072 Precordial pain: Secondary | ICD-10-CM | POA: Insufficient documentation

## 2020-04-11 DIAGNOSIS — R0609 Other forms of dyspnea: Secondary | ICD-10-CM

## 2020-04-11 DIAGNOSIS — R0600 Dyspnea, unspecified: Secondary | ICD-10-CM | POA: Insufficient documentation

## 2020-04-11 LAB — ECHOCARDIOGRAM COMPLETE
Area-P 1/2: 3.95 cm2
S' Lateral: 2.3 cm

## 2020-04-12 ENCOUNTER — Other Ambulatory Visit: Payer: Managed Care, Other (non HMO)

## 2020-04-16 ENCOUNTER — Other Ambulatory Visit (HOSPITAL_COMMUNITY)
Admission: RE | Admit: 2020-04-16 | Discharge: 2020-04-16 | Disposition: A | Discharge status: Home / Self Care | Payer: Managed Care, Other (non HMO) | Source: Ambulatory Visit | Attending: General Surgery | Admitting: General Surgery

## 2020-04-16 DIAGNOSIS — Z20822 Contact with and (suspected) exposure to covid-19: Secondary | ICD-10-CM | POA: Insufficient documentation

## 2020-04-16 DIAGNOSIS — Z01812 Encounter for preprocedural laboratory examination: Secondary | ICD-10-CM | POA: Diagnosis present

## 2020-04-16 LAB — SARS CORONAVIRUS 2 (TAT 6-24 HRS): SARS Coronavirus 2: NEGATIVE

## 2020-04-19 ENCOUNTER — Encounter (HOSPITAL_COMMUNITY)
Admission: RE | Disposition: A | Discharge status: Home / Self Care | Payer: Self-pay | Source: Home / Self Care | Attending: General Surgery

## 2020-04-19 ENCOUNTER — Ambulatory Visit (HOSPITAL_COMMUNITY)
Admission: RE | Admit: 2020-04-19 | Discharge: 2020-04-19 | Disposition: A | Discharge status: Home / Self Care | Payer: Managed Care, Other (non HMO) | Attending: General Surgery | Admitting: General Surgery

## 2020-04-19 DIAGNOSIS — Z933 Colostomy status: Secondary | ICD-10-CM | POA: Diagnosis not present

## 2020-04-19 DIAGNOSIS — M9905 Segmental and somatic dysfunction of pelvic region: Secondary | ICD-10-CM | POA: Diagnosis not present

## 2020-04-19 HISTORY — PX: ANAL RECTAL MANOMETRY: SHX6358

## 2020-04-19 SURGERY — MANOMETRY, ANORECTAL

## 2020-04-19 NOTE — Progress Notes (Signed)
Anal manometry done per protocol. Patient unable to expel balloon with in 3 minutes. Pt tolerated study well without distress or complication. report send to [facility].

## 2020-04-22 ENCOUNTER — Encounter (HOSPITAL_COMMUNITY): Payer: Self-pay | Admitting: General Surgery

## 2020-04-29 ENCOUNTER — Other Ambulatory Visit: Payer: Managed Care, Other (non HMO)

## 2020-04-29 ENCOUNTER — Ambulatory Visit
Admission: RE | Admit: 2020-04-29 | Discharge: 2020-04-29 | Disposition: A | Discharge status: Home / Self Care | Payer: Managed Care, Other (non HMO) | Source: Ambulatory Visit | Attending: Gastroenterology | Admitting: Gastroenterology

## 2020-04-29 DIAGNOSIS — K5909 Other constipation: Secondary | ICD-10-CM

## 2020-04-29 MED ORDER — IOPAMIDOL (ISOVUE-300) INJECTION 61%
100.0000 mL | Freq: Once | INTRAVENOUS | Status: AC | PRN
  Administered 2020-04-29: 100 mL via INTRAVENOUS

## 2020-05-20 ENCOUNTER — Other Ambulatory Visit: Payer: Self-pay

## 2020-05-20 ENCOUNTER — Ambulatory Visit: Payer: Managed Care, Other (non HMO) | Attending: General Surgery | Admitting: Physical Therapy

## 2020-05-20 ENCOUNTER — Encounter: Payer: Self-pay | Admitting: Physical Therapy

## 2020-05-20 DIAGNOSIS — M62838 Other muscle spasm: Secondary | ICD-10-CM | POA: Diagnosis present

## 2020-05-20 DIAGNOSIS — R278 Other lack of coordination: Secondary | ICD-10-CM

## 2020-05-20 DIAGNOSIS — M6281 Muscle weakness (generalized): Secondary | ICD-10-CM | POA: Insufficient documentation

## 2020-05-20 NOTE — Patient Instructions (Addendum)
Lay on your side with knees above hips Breath into your lower rib cage and abdomen to feel the anus open up  For 10x  Then the 11 th time you breath out with a low grr or like bloweing out a 100 candles, blowing through a straw You want feel the anal relax and feel slight tightening into  the lower abdominals  Do this routine 1 time per day  [facility] [address] [place], [place] [address] Phone # [phone] Fax [phone]

## 2020-05-20 NOTE — Therapy (Signed)
[facility] [address] [place], [place], [address] Phone: [phone]   Fax:  [phone]  Physical Therapy Evaluation  Patient Details  Name: Joanna Yang MRN: [id number] Date of Birth: 17-Nov-1970 Referring Provider (PT): Buddy Duty Marcello Moores   Encounter Date: 05/20/2020   PT End of Session - 05/20/20 1617    Visit Number 1    Date for PT Re-Evaluation 09/19/20    Authorization Type cigna    Authorization - Visit Number 1    Authorization - Number of Visits 57    PT Start Time 5397    PT Stop Time 1612    PT Time Calculation (min) 42 min    Activity Tolerance Patient tolerated treatment well    Behavior During Therapy [facility] for tasks assessed/performed           Past Medical History:  Diagnosis Date  . Anemia   . Anxiety   . Complication of anesthesia   . Endometrial polyp 03/22/2019  . Family history of breast cancer   . Fibroid   . Heart murmur    pt. thinks she has a benign murmur dx'd during pregnancy  . Mitral regurgitation    a. Prior 2D echo in 2014 showed technically limited study, EF 60%, mild MR, trace TR.  Marland Kitchen Pneumonia   . PONV (postoperative nausea and vomiting)   . Right ovarian cyst 07/29/2019    Past Surgical History:  Procedure Laterality Date  . ANAL RECTAL MANOMETRY N/A 04/19/2020   Procedure: ANO RECTAL MANOMETRY;  Surgeon: Leighton Ruff, MD;  Location: [facility];  Service: Endoscopy;  Laterality: N/A;  . CESAREAN SECTION  2009  . COLONOSCOPY    . CYSTOSCOPY N/A 12/18/2019   Procedure: CYSTOSCOPY;  Surgeon: Nunzio Cobbs, MD;  [facility];  Service: Gynecology;  Laterality: N/A;  . ESOPHAGOGASTRODUODENOSCOPY ENDOSCOPY    . IUD REMOVAL  07/29/2019   expelling IUD  . LAPAROTOMY N/A 12/21/2019   Procedure: EXPLORATORY LAPAROTOMY; RESECTION OF DESCENDING AND SIGMOID COLON AND TRANSVERSE COLOSTOMY;  Surgeon: Armandina Gemma, MD;  Location: [facility];   Service: General;  Laterality: N/A;  . TOTAL LAPAROSCOPIC HYSTERECTOMY WITH SALPINGECTOMY N/A 12/18/2019   Procedure: TOTAL LAPAROSCOPIC HYSTERECTOMY WITH SALPINGECTOMY and lysis of adhesions;  Surgeon: Nunzio Cobbs, MD;  [facility];  Service: Gynecology;  Laterality: N/A;  . TUBAL LIGATION  2009    There were no vitals filed for this visit.    Subjective Assessment - 05/20/20 1529    Subjective Patient had a hysterectomy on 12/19/2019. when she went home had a big abdominal pain. 12/21/2019 was in the hospital and had a CT scan. 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 Had a history of chronic constipation. Anal mamontry showed she tightens the anus instead of relax. Has a slow moving intestines. Transvers colon does dilate. Stoma prolapses.    Patient Stated Goals Be able to have the anal sphincter muscle ro work correctly to have her colonoscopy reversed    Currently in Pain? No/denies    Multiple Pain Sites No              [facility] PT Assessment - 05/20/20 0001      Assessment   Medical Diagnosis K56.41 Obstructive defecation    Referring Provider (PT) Dr.Alicia Marcello Moores    Onset Date/Surgical Date --   11/2019  Precautions   Precautions None      Restrictions   Weight Bearing Restrictions No      Balance Screen   Has the patient fallen in the past 6 months No    Has the patient had a decrease in activity level because of a fear of falling?  No    Is the patient reluctant to leave their home because of a fear of falling?  No      Prior Function   Level of Independence Independent      Cognition   Overall Cognitive Status Within Functional Limits for tasks assessed      Observation/Other Assessments   Skin Integrity surgical site is tight and restricted midline of abdomen from the exploratory surgery      Posture/Postural Control   Posture/Postural Control No  significant limitations      ROM / Strength   AROM / PROM / Strength AROM;Strength;PROM      AROM   Lumbar Extension decreased by 50%    Lumbar - Right Side Bend decreased by 25%    Lumbar - Left Side Bend decreased by 25%      Strength   Overall Strength Comments trouble standing on 1 leg more than 2 seconds; trouble contracting the lower abdomen      Palpation   SI assessment  right ilium is rotated anteriorly    Palpation comment left lower rib cage is outflared                      Objective measurements completed on examination: See above findings.     Pelvic Floor Special Questions - 05/20/20 0001    Prior Pregnancies Yes    Number of C-Sections 1    Number of Vaginal Deliveries 1   tore alot; lots of stitches, increased vaginal swelling; had to self catherize   Diastasis Recti 1 finger width below the umbilicus    Currently Sexually Active Yes    Marinoff Scale discomfort that does not affect completion    Urinary Leakage No    Fecal incontinence Yes    Skin Integrity Hemorroids;Intact    Pelvic Floor Internal Exam Patient confirms identification and approves PT to assess pelvic floor and treatment    Exam Type Rectal    Palpation tightness in the puborecatolis, post. anal canal, When ask to bulge the anus not able to do it    Strength Flicker   after manual work was able to contract to 2/5                   PT Education - 05/20/20 1618    Education Details instructed patient on diaphgramtic breathing and bulging the pelvic floor    Person(s) Educated Patient    Methods Explanation;Demonstration;Handout    Comprehension Verbalized understanding;Returned demonstration            PT Short Term Goals - 05/20/20 1639      PT SHORT TERM GOAL #1   Title Independent with initial HEP for diaphragmatic breathing, hip stretches    Time 4    Period Weeks    Status New    Target Date 06/17/20      PT SHORT TERM GOAL #2   Title education on  scar massge to improve mobiltiy    Time 4    Period Weeks    Status New    Target Date 06/17/20  PT Long Term Goals - 05/20/20 1640      PT LONG TERM GOAL #1   Title independent with advanced HEP for core    Time 4    Period Months    Target Date 09/19/20      PT LONG TERM GOAL #2   Title able to bulge the pelvic floor while she is pushing the therapist finger out of the anal canal    Time 4    Period Months    Status New    Target Date 09/19/20      PT LONG TERM GOAL #3   Title ability to contract the lower abdominals with her core exercises    Time 4    Period Months    Status New    Target Date 09/19/20      PT LONG TERM GOAL #4   Title able to return demonstration  correct toileting in sitting with relaxtion of the pelvic floor and correct breathing    Time 4    Period Months    Status New    Target Date 09/19/20                  Plan - 05/20/20 1619    Clinical Impression Statement Patient is a 50 year old female with obstructive defication. Patient had a Hysterectomy on 12/18/2019 then abdominal exploratory surgery on 12/21/2019. Patient then had a colostomy due to a Mega colon. Patient wants to have her colostomy reversed. The anorectal manometry showed she does not relax her pelvic floor with defecation and not able to expel the balloon. Patient needs to be able to relax her pelvic floor to expel stool. Patient has a lifelong history of constipation issues. Pelvic floor strength is 2/5  for 1 second She has thickness present on the posterior anal canal and decreased movement of the puborectalis. Patient will tighten around the therapist finger when asked to bulge the pelvic floor. Right ilium is rotated anteriorly. Patient abdominal scar from the exploratory surgery is tight, painful and slightly raised. Patient rib cage is flared on the left. She has difficulty with contracting her lower abdomen. Lumbar spine is decreased by 25% with bilteral  sidebending and extension. Patient has to wear an abdomial brace due to her ostomy prolapses. Patient has tightness in the lumbar muscles. Patient is not able to expand her abdomen very well. Patient will move slowly due to her nervous about pain and being careful of the incision. Patient will benefit from skilled therapy to improve pelvic floor function so she is able to expel the balloon with anorectal manometry so she is able to have the colostomy reversed.    Personal Factors and Comorbidities Fitness;Comorbidity 2    Comorbidities Hysterectomy 12/18/2019; colostomy 12/21/2019;c-section 2009    Examination-Activity Limitations Toileting    Examination-Participation Restrictions Community Activity    Stability/Clinical Decision Making Stable/Uncomplicated    Clinical Decision Making Low    Rehab Potential Excellent    PT Frequency 1x / week   every other week   PT Duration Other (comment)   4 months   PT Treatment/Interventions ADLs/Self Care Home Management;Biofeedback;Therapeutic activities;Therapeutic exercise;Neuromuscular re-education;Patient/family education;Manual techniques;Dry needling;Scar mobilization;Spinal Manipulations    PT Next Visit Plan scar massage; abdominal massage for improved peristalic motion; manual work internally on the anus; coccy mobilization in quadruped, cat cow, lumbar rotation, happy baby    Consulted and Agree with Plan of Care Patient           Patient  will benefit from skilled therapeutic intervention in order to improve the following deficits and impairments:  Decreased coordination,Decreased range of motion,Increased fascial restricitons,Decreased endurance,Increased muscle spasms,Decreased activity tolerance,Decreased strength,Decreased scar mobility  Visit Diagnosis: Muscle weakness (generalized) - Plan: PT plan of care cert/re-cert  Other lack of coordination - Plan: PT plan of care cert/re-cert  Other muscle spasm - Plan: PT plan of care  cert/re-cert     Problem List Patient Active Problem List   Diagnosis Date Noted  . Internal hernia 12/21/2019  . Colonic obstruction ([place]) 12/21/2019  . Status post surgery 12/18/2019  . Status post laparoscopic hysterectomy 12/18/2019  . Genetic testing 10/07/2019  . Family history of breast cancer   . Discomfort in chest 10/29/2016  . Chronic fatigue 10/29/2016  . Allergic rhinitis due to allergen 10/29/2016  . Vitamin D deficiency 10/29/2016    Earlie Counts, PT 05/20/20 4:46 PM   [facility] [address] [place], [place], [address] Phone: [phone]   Fax:  [phone]  Name: Joanna Yang MRN: [id number] Date of Birth: 1970-08-30

## 2020-06-04 ENCOUNTER — Telehealth: Payer: Self-pay | Admitting: *Deleted

## 2020-06-04 ENCOUNTER — Telehealth: Payer: Self-pay

## 2020-06-04 NOTE — Telephone Encounter (Signed)
Patient left voicemail on Triage line asking for a return call in regards to referral to [facility].   Attempted to reach patient, but voicemail full, unable to leave a message at this time.

## 2020-06-04 NOTE — Telephone Encounter (Signed)
Patient called because she received a message today from urology group about a referral Dr. [name] sent for appt.  She said Dr. [name] referred her previously 01/31/20 and she saw Dr. Claudia Desanctis and everything was fine.  She wanted to confirm Dr. [name] had not decided she needed to return. I told her upon review of her chart I see nothing about a referral in recent days, just the one where Dr. [name] referred her to Dr. Claudia Desanctis.  Patient thinks it was an error on their office's part.

## 2020-06-13 ENCOUNTER — Encounter: Payer: Managed Care, Other (non HMO) | Admitting: Physical Therapy

## 2020-06-24 ENCOUNTER — Ambulatory Visit: Payer: Managed Care, Other (non HMO) | Admitting: Physical Therapy

## 2020-07-10 ENCOUNTER — Encounter: Payer: Managed Care, Other (non HMO) | Admitting: Physical Therapy

## 2020-07-24 ENCOUNTER — Ambulatory Visit: Payer: Managed Care, Other (non HMO) | Attending: General Surgery | Admitting: Physical Therapy

## 2020-07-24 ENCOUNTER — Other Ambulatory Visit: Payer: Self-pay

## 2020-07-24 ENCOUNTER — Encounter: Payer: Self-pay | Admitting: Physical Therapy

## 2020-07-24 DIAGNOSIS — M6281 Muscle weakness (generalized): Secondary | ICD-10-CM | POA: Diagnosis present

## 2020-07-24 DIAGNOSIS — M62838 Other muscle spasm: Secondary | ICD-10-CM

## 2020-07-24 DIAGNOSIS — R278 Other lack of coordination: Secondary | ICD-10-CM | POA: Diagnosis present

## 2020-07-24 NOTE — Patient Instructions (Signed)
Access Code: [email or web address] URL: [email or web address] Date: 07/24/2020 Prepared by: Earlie Counts  Exercises Supine Hamstring Stretch - 1 x daily - 7 x weekly - 1 sets - 2 reps - 30 sec hold Supine Piriformis Stretch with Leg Straight - 1 x daily - 7 x weekly - 1 sets - 2 reps - 30 sec hold Supine Pelvic Floor Stretch - 1 x daily - 7 x weekly - 1 sets - 1 reps - 30 sec hold V Sit Hip Adductor Hamstring Stretch - 1 x daily - 7 x weekly - 1 sets - 2 reps - 30 sec hold Cat-Camel - 1 x daily - 7 x weekly - 1 sets - 10 reps Child's Pose Stretch - 1 x daily - 7 x weekly - 1 sets - 1 reps - 30 sec hold [facility] [address] [place], [place] [address] Phone # [phone] Fax [phone]

## 2020-07-24 NOTE — Therapy (Signed)
[facility] [address] [place], [place], [address] Phone: [phone]   Fax:  [phone]  Physical Therapy Treatment  Patient Details  Name: Joanna Yang MRN: [id number] Date of Birth: Apr 14, 1970 Referring Provider (PT): Buddy Duty Marcello Moores   Encounter Date: 07/24/2020   PT End of Session - 07/24/20 1021    Visit Number 2    Date for PT Re-Evaluation 09/19/20    Authorization Type cigna    Authorization - Visit Number 2    Authorization - Number of Visits 90    PT Start Time 2330    PT Stop Time 1055    PT Time Calculation (min) 40 min    Activity Tolerance Patient tolerated treatment well    Behavior During Therapy [facility] for tasks assessed/performed           Past Medical History:  Diagnosis Date  . Anemia   . Anxiety   . Complication of anesthesia   . Endometrial polyp 03/22/2019  . Family history of breast cancer   . Fibroid   . Heart murmur    pt. thinks she has a benign murmur dx'd during pregnancy  . Mitral regurgitation    a. Prior 2D echo in 2014 showed technically limited study, EF 60%, mild MR, trace TR.  Marland Kitchen Pneumonia   . PONV (postoperative nausea and vomiting)   . Right ovarian cyst 07/29/2019    Past Surgical History:  Procedure Laterality Date  . ANAL RECTAL MANOMETRY N/A 04/19/2020   Procedure: ANO RECTAL MANOMETRY;  Surgeon: Leighton Ruff, MD;  [facility]: [facility];  Service: Endoscopy;  Laterality: N/A;  . CESAREAN SECTION  2009  . COLONOSCOPY    . CYSTOSCOPY N/A 12/18/2019   Procedure: CYSTOSCOPY;  Surgeon: Nunzio Cobbs, MD;  Location: [facility];  Service: Gynecology;  Laterality: N/A;  . ESOPHAGOGASTRODUODENOSCOPY ENDOSCOPY    . IUD REMOVAL  07/29/2019   expelling IUD  . LAPAROTOMY N/A 12/21/2019   Procedure: EXPLORATORY LAPAROTOMY; RESECTION OF DESCENDING AND SIGMOID COLON AND TRANSVERSE COLOSTOMY;  Surgeon: Armandina Gemma, MD;  Location: [facility];   Service: General;  Laterality: N/A;  . TOTAL LAPAROSCOPIC HYSTERECTOMY WITH SALPINGECTOMY N/A 12/18/2019   Procedure: TOTAL LAPAROSCOPIC HYSTERECTOMY WITH SALPINGECTOMY and lysis of adhesions;  Surgeon: Nunzio Cobbs, MD;  Location: [facility];  Service: Gynecology;  Laterality: N/A;  . TUBAL LIGATION  2009    There were no vitals filed for this visit.   Subjective Assessment - 07/24/20 1018    Subjective I am waiting on the osteomy right now. I was in [place] for 3 weeks due to my mom passing away. My son has the same issues.    Patient Stated Goals Be able to have the anal sphincter muscle ro work correctly to have her colonoscopy reversed    Currently in Pain? No/denies    Multiple Pain Sites No                             [facility] Adult PT Treatment/Exercise - 07/24/20 0001      Neuro Re-ed    Neuro Re-ed Details  diaphragmatic breathing with opening the lower rib cage into the stomach to work on relaxing the pelvic floor      Lumbar Exercises: Stretches   Quadruped Mid Back Stretch 1 rep;30 seconds    Quadruped Mid Back Stretch Limitations childs pose  Piriformis Stretch Right;Left;1 rep;30 seconds    Piriformis Stretch Limitations supine    Other Lumbar Stretch Exercise happy baby holding for 1 minute    Other Lumbar Stretch Exercise sitting hip adductor stretch      Lumbar Exercises: Quadruped   Madcat/Old Horse 10 reps      Manual Therapy   Manual Therapy Soft tissue mobilization;Joint mobilization;Myofascial release    Joint Mobilization lower rib cage mobilization to the bil. lower rib cage to reduce the outflare    Soft tissue mobilization manual work to the thoracolumbar paraspinals in supine, bilateral diaphragm    Myofascial Release fascial release around the abdominal scar                  PT Education - 07/24/20 1057    Education Details Access Code: [email or web address]    Person(s) Educated Patient    Methods  Explanation;Demonstration;Verbal cues;Handout    Comprehension Returned demonstration;Verbalized understanding            PT Short Term Goals - 05/20/20 1639      PT SHORT TERM GOAL #1   Title Independent with initial HEP for diaphragmatic breathing, hip stretches    Time 4    Period Weeks    Status New    Target Date 06/17/20      PT SHORT TERM GOAL #2   Title education on scar massge to improve mobiltiy    Time 4    Period Weeks    Status New    Target Date 06/17/20             PT Long Term Goals - 05/20/20 1640      PT LONG TERM GOAL #1   Title independent with advanced HEP for core    Time 4    Period Months    Target Date 09/19/20      PT LONG TERM GOAL #2   Title able to bulge the pelvic floor while she is pushing the therapist finger out of the anal canal    Time 4    Period Months    Status New    Target Date 09/19/20      PT LONG TERM GOAL #3   Title ability to contract the lower abdominals with her core exercises    Time 4    Period Months    Status New    Target Date 09/19/20      PT LONG TERM GOAL #4   Title able to return demonstration  correct toileting in sitting with relaxtion of the pelvic floor and correct breathing    Time 4    Period Months    Status New    Target Date 09/19/20                 Plan - 07/24/20 1059    Clinical Impression Statement Patient has not been in therapy for 2 months due to her being to be in [place] for her mothers surgery and she passed away while in the hospital. Patient had increased scar mobility after manual work and the ribs were not as outflared. Patient needs tactile and manual cues to perform diaphragmatic breathing. Patient will benefit from skilled therapy to improve pelvic floor function so she is able to expel the balloon with anorectal manmotry so she is able to have the colostomy reversed.    Personal Factors and Comorbidities Fitness;Comorbidity 2    Comorbidities Hysterectomy 12/18/2019;  colostomy 12/21/2019;c-section 2009    Examination-Activity  Limitations Toileting    Examination-Participation Restrictions Community Activity    Stability/Clinical Decision Making Stable/Uncomplicated    Rehab Potential Excellent    PT Frequency 1x / week   every other week   PT Duration Other (comment)   4 months   PT Treatment/Interventions ADLs/Self Care Home Management;Biofeedback;Therapeutic activities;Therapeutic exercise;Neuromuscular re-education;Patient/family education;Manual techniques;Dry needling;Scar mobilization;Spinal Manipulations    PT Next Visit Plan scar massageand educated for home; diaphragmatic breathing;  manual work internally on the anus; coccy mobilization in quadruped,    PT Home Exercise Plan Access Code: [email or web address]    Recommended Other Services MD signed initial note    Consulted and Agree with Plan of Care Patient           Patient will benefit from skilled therapeutic intervention in order to improve the following deficits and impairments:  Decreased coordination,Decreased range of motion,Increased fascial restricitons,Decreased endurance,Increased muscle spasms,Decreased activity tolerance,Decreased strength,Decreased scar mobility  Visit Diagnosis: Muscle weakness (generalized)  Other lack of coordination  Other muscle spasm     Problem List Patient Active Problem List   Diagnosis Date Noted  . Internal hernia 12/21/2019  . Colonic obstruction ([place]) 12/21/2019  . Status post surgery 12/18/2019  . Status post laparoscopic hysterectomy 12/18/2019  . Genetic testing 10/07/2019  . Family history of breast cancer   . Discomfort in chest 10/29/2016  . Chronic fatigue 10/29/2016  . Allergic rhinitis due to allergen 10/29/2016  . Vitamin D deficiency 10/29/2016    Earlie Counts, PT 07/24/20 11:53 AM   [facility] [address] [place], [place], [address] Phone: [phone]   Fax:   [phone]  Name: Joanna Yang MRN: [id number] Date of Birth: 10-May-1970

## 2020-07-26 ENCOUNTER — Other Ambulatory Visit: Payer: Self-pay | Admitting: Obstetrics and Gynecology

## 2020-07-26 DIAGNOSIS — Z1231 Encounter for screening mammogram for malignant neoplasm of breast: Secondary | ICD-10-CM

## 2020-08-07 ENCOUNTER — Ambulatory Visit: Payer: Managed Care, Other (non HMO) | Admitting: Physical Therapy

## 2020-08-07 ENCOUNTER — Other Ambulatory Visit: Payer: Self-pay

## 2020-08-07 ENCOUNTER — Encounter: Payer: Self-pay | Admitting: Physical Therapy

## 2020-08-07 DIAGNOSIS — M62838 Other muscle spasm: Secondary | ICD-10-CM

## 2020-08-07 DIAGNOSIS — M6281 Muscle weakness (generalized): Secondary | ICD-10-CM

## 2020-08-07 DIAGNOSIS — R278 Other lack of coordination: Secondary | ICD-10-CM

## 2020-08-07 NOTE — Therapy (Signed)
[facility] [address], [place], [place], [address] Phone: [phone]   Fax:  [phone]  Physical Therapy Treatment  Patient Details  Name: Joanna Yang MRN: [id number] Date of Birth: December 22, 1970 Referring Provider (PT): Buddy Duty Marcello Moores   Encounter Date: 08/07/2020   PT End of Session - 08/07/20 1020     Visit Number 3    Date for PT Re-Evaluation 09/19/20    Authorization Type cigna    Authorization - Visit Number 3    Authorization - Number of Visits 90    PT Start Time 1324    PT Stop Time 1055    PT Time Calculation (min) 40 min    Activity Tolerance Patient tolerated treatment well    Behavior During Therapy [facility] for tasks assessed/performed             Past Medical History:  Diagnosis Date   Anemia    Anxiety    Complication of anesthesia    Endometrial polyp 03/22/2019   Family history of breast cancer    Fibroid    Heart murmur    pt. thinks she has a benign murmur dx'd during pregnancy   Mitral regurgitation    a. Prior 2D echo in 2014 showed technically limited study, EF 60%, mild MR, trace TR.   Pneumonia    PONV (postoperative nausea and vomiting)    Right ovarian cyst 07/29/2019    Past Surgical History:  Procedure Laterality Date   ANAL RECTAL MANOMETRY N/A 04/19/2020   Procedure: ANO RECTAL MANOMETRY;  Surgeon: Leighton Ruff, MD;  Location: [facility];  Service: Endoscopy;  Laterality: N/A;   CESAREAN SECTION  2009   COLONOSCOPY     CYSTOSCOPY N/A 12/18/2019   Procedure: CYSTOSCOPY;  Surgeon: Nunzio Cobbs, MD;  Location: [facility];  Service: Gynecology;  Laterality: N/A;   ESOPHAGOGASTRODUODENOSCOPY ENDOSCOPY     IUD REMOVAL  07/29/2019   expelling IUD   LAPAROTOMY N/A 12/21/2019   Procedure: EXPLORATORY LAPAROTOMY; RESECTION OF DESCENDING AND SIGMOID COLON AND TRANSVERSE COLOSTOMY;  Surgeon: Armandina Gemma, MD;  Location: [facility];  Service:  General;  Laterality: N/A;   TOTAL LAPAROSCOPIC HYSTERECTOMY WITH SALPINGECTOMY N/A 12/18/2019   Procedure: TOTAL LAPAROSCOPIC HYSTERECTOMY WITH SALPINGECTOMY and lysis of adhesions;  Surgeon: Nunzio Cobbs, MD;  Location: [facility];  Service: Gynecology;  Laterality: N/A;   TUBAL LIGATION  2009    There were no vitals filed for this visit.   Subjective Assessment - 08/07/20 1017     Subjective I am doing good.    Patient Stated Goals Be able to have the anal sphincter muscle ro work correctly to have her colonoscopy reversed    Currently in Pain? No/denies    Multiple Pain Sites No                            Pelvic Floor Special Questions - 08/07/20 0001     Pelvic Floor Internal Exam Patient confirms identification and approves PT to assess pelvic floor and treatment    Exam Type Rectal;Vaginal    Palpation tightness in the external anal sphincter, anterior portion of the anus and poster               [facility] Adult PT Treatment/Exercise - 08/07/20 0001       Neuro Re-ed    Neuro Re-ed Details  diaphragmatic breathing with  opening the lower rib cage into the stomach to work on relaxing the pelvic floor, VC to go slowly and relax the pelvic floor and needed many tactile cue but able to perfomr      Lumbar Exercises: Stretches   Active Hamstring Stretch Right;Left;1 rep;30 seconds    Active Hamstring Stretch Limitations supine with strap    ITB Stretch Right;Left;1 rep;30 seconds    ITB Stretch Limitations supine with strap    Piriformis Stretch Right;Left;1 rep;30 seconds    Piriformis Stretch Limitations pulling knee across body      Manual Therapy   Manual Therapy Soft tissue mobilization;Internal Pelvic Floor    Soft tissue mobilization manual work to the abdominal scar and educated patient on how to perform on herself; manual work around the anal sphincter externally    Internal Pelvic Floor one finger in the anal canal  and other in the vaginal canal releasing the wall between due to the 3rd degree scar from birth; manual mobilization to the external anal sphincter                    PT Education - 08/07/20 1103     Education Details added scar massage    Person(s) Educated Patient    Methods Explanation;Demonstration;Verbal cues;Handout    Comprehension Returned demonstration;Verbalized understanding              PT Short Term Goals - 08/07/20 1019       PT SHORT TERM GOAL #1   Title Independent with initial HEP for diaphragmatic breathing, hip stretches    Baseline doing stretches at home    Time 4    Period Weeks    Status On-going      PT SHORT TERM GOAL #2   Title education on scar massge to improve mobiltiy    Time 4    Period Weeks    Status On-going    Target Date 06/17/20               PT Long Term Goals - 05/20/20 1640       PT LONG TERM GOAL #1   Title independent with advanced HEP for core    Time 4    Period Months    Target Date 09/19/20      PT LONG TERM GOAL #2   Title able to bulge the pelvic floor while she is pushing the therapist finger out of the anal canal    Time 4    Period Months    Status New    Target Date 09/19/20      PT LONG TERM GOAL #3   Title ability to contract the lower abdominals with her core exercises    Time 4    Period Months    Status New    Target Date 09/19/20      PT LONG TERM GOAL #4   Title able to return demonstration  correct toileting in sitting with relaxtion of the pelvic floor and correct breathing    Time 4    Period Months    Status New    Target Date 09/19/20                   Plan - 08/07/20 1103     Clinical Impression Statement Patient was educated on mobilization of her scar to do at home and the scar was tender in the last half. Patient has tightness in the posterior vaginal wall and anterior  anal canal. She had a grade 3 tear during the birth of her son with many stitiches.  She  has tightness in the external anal sphinter and difficulty relaxing it with diaphramatic breathing. She is able to expand her lower rib cage and abdomen with diaphragmatic breathiing but difficulty relaxing the pelvic floor. She continues to have restrictions with her abdominal scar. Patient will benefit from skilled therapy to learn how to relax and bulge the pelvic floor for future bowel movements.    Personal Factors and Comorbidities Fitness;Comorbidity 2    Comorbidities Hysterectomy 12/18/2019; colostomy 12/21/2019;c-section 2009    Examination-Activity Limitations Toileting    Examination-Participation Restrictions Community Activity    Stability/Clinical Decision Making Stable/Uncomplicated    Rehab Potential Excellent    PT Frequency 1x / week   every other week   PT Duration Other (comment)   4 months   PT Treatment/Interventions ADLs/Self Care Home Management;Biofeedback;Therapeutic activities;Therapeutic exercise;Neuromuscular re-education;Patient/family education;Manual techniques;Dry needling;Scar mobilization;Spinal Manipulations    PT Next Visit Plan diaphragmatic breathing;  manual work internally on the anus and vaginal anal mobilization; coccy mobilization in quadruped,    PT Home Exercise Plan Access Code: [email or web address]    Consulted and Agree with Plan of Care Patient             Patient will benefit from skilled therapeutic intervention in order to improve the following deficits and impairments:  Decreased coordination, Decreased range of motion, Increased fascial restricitons, Decreased endurance, Increased muscle spasms, Decreased activity tolerance, Decreased strength, Decreased scar mobility  Visit Diagnosis: Muscle weakness (generalized)  Other lack of coordination  Other muscle spasm     Problem List Patient Active Problem List   Diagnosis Date Noted   Internal hernia 12/21/2019   Colonic obstruction ([place]) 12/21/2019   Status post surgery 12/18/2019    Status post laparoscopic hysterectomy 12/18/2019   Genetic testing 10/07/2019   Family history of breast cancer    Discomfort in chest 10/29/2016   Chronic fatigue 10/29/2016   Allergic rhinitis due to allergen 10/29/2016   Vitamin D deficiency 10/29/2016    Earlie Counts, PT 08/07/20 11:14 AM  [facility] [address] [place], [place], [address] Phone: [phone]   Fax:  [phone]  Name: Joanna Yang MRN: [id number] Date of Birth: [birth year]

## 2020-08-28 ENCOUNTER — Encounter: Payer: Self-pay | Admitting: Physical Therapy

## 2020-08-28 ENCOUNTER — Ambulatory Visit: Payer: Managed Care, Other (non HMO) | Attending: General Surgery | Admitting: Physical Therapy

## 2020-08-28 ENCOUNTER — Other Ambulatory Visit: Payer: Self-pay

## 2020-08-28 DIAGNOSIS — R278 Other lack of coordination: Secondary | ICD-10-CM | POA: Diagnosis present

## 2020-08-28 DIAGNOSIS — M62838 Other muscle spasm: Secondary | ICD-10-CM

## 2020-08-28 DIAGNOSIS — M6281 Muscle weakness (generalized): Secondary | ICD-10-CM | POA: Insufficient documentation

## 2020-08-28 NOTE — Therapy (Signed)
[facility] [address] [place], [place], [address] Phone: [phone]   Fax:  [phone]  Physical Therapy Treatment  Patient Details  Name: Joanna Yang MRN: [id number] Date of Birth: 11-Jun-1970 Referring Provider (PT): Buddy Duty Marcello Moores   Encounter Date: 08/28/2020   PT End of Session - 08/28/20 1010     Visit Number 4    Date for PT Re-Evaluation 01/20/21    Authorization Type cigna    Authorization - Visit Number 4    Authorization - Number of Visits 90    PT Start Time 0930    PT Stop Time 1008    PT Time Calculation (min) 38 min    Activity Tolerance Patient tolerated treatment well    Behavior During Therapy [facility] for tasks assessed/performed             Past Medical History:  Diagnosis Date   Anemia    Anxiety    Complication of anesthesia    Endometrial polyp 03/22/2019   Family history of breast cancer    Fibroid    Heart murmur    pt. thinks she has a benign murmur dx'd during pregnancy   Mitral regurgitation    a. Prior 2D echo in 2014 showed technically limited study, EF 60%, mild MR, trace TR.   Pneumonia    PONV (postoperative nausea and vomiting)    Right ovarian cyst 07/29/2019    Past Surgical History:  Procedure Laterality Date   ANAL RECTAL MANOMETRY N/A 04/19/2020   Procedure: ANO RECTAL MANOMETRY;  Surgeon: Leighton Ruff, MD;  Location: [facility];  Service: Endoscopy;  Laterality: N/A;   CESAREAN SECTION  2009   COLONOSCOPY     CYSTOSCOPY N/A 12/18/2019   Procedure: CYSTOSCOPY;  Surgeon: Nunzio Cobbs, MD;  Location: [facility];  Service: Gynecology;  Laterality: N/A;   ESOPHAGOGASTRODUODENOSCOPY ENDOSCOPY     IUD REMOVAL  07/29/2019   expelling IUD   LAPAROTOMY N/A 12/21/2019   Procedure: EXPLORATORY LAPAROTOMY; RESECTION OF DESCENDING AND SIGMOID COLON AND TRANSVERSE COLOSTOMY;  Surgeon: Armandina Gemma, MD;  Location: [facility];  Service:  General;  Laterality: N/A;   TOTAL LAPAROSCOPIC HYSTERECTOMY WITH SALPINGECTOMY N/A 12/18/2019   Procedure: TOTAL LAPAROSCOPIC HYSTERECTOMY WITH SALPINGECTOMY and lysis of adhesions;  Surgeon: Nunzio Cobbs, MD;  Location: [facility];  Service: Gynecology;  Laterality: N/A;   TUBAL LIGATION  2009    There were no vitals filed for this visit.   Subjective Assessment - 08/28/20 0934     Subjective I have not done my exercises due to having family from out of the country. I felt some pressure down in the anus to release gas.    Patient Stated Goals Be able to have the anal sphincter muscle ro work correctly to have her colonoscopy reversed    Currently in Pain? No/denies    Multiple Pain Sites No                [facility] PT Assessment - 08/28/20 0001       Assessment   Medical Diagnosis K56.41 Obstructive defecation    Referring Provider (PT) Dr.Alicia Marcello Moores    Onset Date/Surgical Date --   11/2019     Precautions   Precautions None      Restrictions   Weight Bearing Restrictions No      Prior Function   Level of Independence Independent      Cognition  Overall Cognitive Status Within Functional Limits for tasks assessed      Observation/Other Assessments   Skin Integrity surgical site is tight and restricted midline of abdomen from the exploratory surgery      AROM   Lumbar Extension decreased by 50%    Lumbar - Right Side Bend decreased by 25%    Lumbar - Left Side Bend decreased by 25%      Strength   Overall Strength Comments not having trouble with standing on one leg      Palpation   SI assessment  ASIS are equal    Palpation comment left lower rib cage is outflared                        Pelvic Floor Special Questions - 08/28/20 0001     Strength weak squeeze, no lift               [facility] Adult PT Treatment/Exercise - 08/28/20 0001       Neuro Re-ed    Neuro Re-ed Details  diaphragmatic breathing with  opening the lower rib cage into the stomach to work on relaxing the pelvic floor,      Lumbar Exercises: Supine   Ab Set 15 reps;2 seconds    AB Set Limitations using tactile cues to contract the upper and lowe rabodminals witout flaring the rib cage not to flare ou; then had patient to perform in sitting      Manual Therapy   Manual Therapy Soft tissue mobilization;Myofascial release    Soft tissue mobilization manual work to the scar on the abdomen to improve mobility    Myofascial Release tissue rolling to the upper abdominal, along the lower rib cage and along the upper portion of the scar                      PT Short Term Goals - 08/28/20 1015       PT SHORT TERM GOAL #1   Title Independent with initial HEP for diaphragmatic breathing, hip stretches    Time 4    Period Weeks    Status Achieved      PT SHORT TERM GOAL #2   Title education on scar massge to improve mobiltiy    Time 4    Period Weeks    Status Achieved               PT Long Term Goals - 08/28/20 1015       PT LONG TERM GOAL #1   Title independent with advanced HEP for core    Time 4    Period Months    Status On-going      PT LONG TERM GOAL #2   Title able to bulge the pelvic floor while she is pushing the therapist finger out of the anal canal    Time 4    Period Months    Status On-going      PT LONG TERM GOAL #3   Title ability to contract the lower abdominals with her core exercises    Time 4    Period Months    Status On-going      PT LONG TERM GOAL #4   Title able to return demonstration  correct toileting in sitting with relaxtion of the pelvic floor and correct breathing    Time 4    Period Months    Status On-going  Plan - 08/28/20 1011     Clinical Impression Statement Patient has family in town from [place]. She will be on hold till the middle of August due to traveling with family. She has improve scar mobility on the abdomen. She is  starting to learn how to contract her abdominal muscles. She is able to expand her lower rib cage to perfrom diaphragmatic breathing. She has felt the urge to pass gas 2 times. Pelvic floor strength is 2/5. She has some tightness in the anal sphincter. She is doing her scar massage at home. She is still learning about toileting techniques. she is able to stand on one leg without difficulty. Patient will benefit from skilled therapy to learn how to relax and bulge the pelvic floor for future bowel movements.    Personal Factors and Comorbidities Fitness;Comorbidity 2    Comorbidities Hysterectomy 12/18/2019; colostomy 12/21/2019;c-section 2009    Examination-Activity Limitations Toileting    Examination-Participation Restrictions Community Activity    Rehab Potential Excellent    PT Frequency 1x / week    PT Duration Other (comment)   4 months   PT Treatment/Interventions ADLs/Self Care Home Management;Biofeedback;Therapeutic activities;Therapeutic exercise;Neuromuscular re-education;Patient/family education;Manual techniques;Dry needling;Scar mobilization;Spinal Manipulations    PT Next Visit Plan diaphragmatic breathing;  manual work internally on the anus and vaginal anal mobilization; coccy mobilization in quadruped,    PT Home Exercise Plan Access Code: [email or web address]    Recommended Other Services sent MD renewal    Consulted and Agree with Plan of Care Patient             Patient will benefit from skilled therapeutic intervention in order to improve the following deficits and impairments:  Decreased coordination, Decreased range of motion, Increased fascial restricitons, Decreased endurance, Increased muscle spasms, Decreased activity tolerance, Decreased strength, Decreased scar mobility  Visit Diagnosis: Muscle weakness (generalized) - Plan: PT plan of care cert/re-cert  Other lack of coordination - Plan: PT plan of care cert/re-cert  Other muscle spasm - Plan: PT plan of care  cert/re-cert     Problem List Patient Active Problem List   Diagnosis Date Noted   Internal hernia 12/21/2019   Colonic obstruction ([place]) 12/21/2019   Status post surgery 12/18/2019   Status post laparoscopic hysterectomy 12/18/2019   Genetic testing 10/07/2019   Family history of breast cancer    Discomfort in chest 10/29/2016   Chronic fatigue 10/29/2016   Allergic rhinitis due to allergen 10/29/2016   Vitamin D deficiency 10/29/2016    Earlie Counts, PT 08/28/20 10:18 AM  [facility] [address], [place], [place], [address] Phone: [phone]   Fax:  [phone]  Name: Joanna Yang MRN: [id number] Date of Birth: [birth year]

## 2020-09-09 ENCOUNTER — Ambulatory Visit: Payer: Managed Care, Other (non HMO) | Admitting: Physical Therapy

## 2020-10-04 ENCOUNTER — Ambulatory Visit: Payer: Managed Care, Other (non HMO)

## 2020-10-16 ENCOUNTER — Ambulatory Visit: Payer: Managed Care, Other (non HMO) | Attending: General Surgery | Admitting: Physical Therapy

## 2020-10-16 ENCOUNTER — Other Ambulatory Visit: Payer: Self-pay

## 2020-10-16 ENCOUNTER — Encounter: Payer: Self-pay | Admitting: Physical Therapy

## 2020-10-16 DIAGNOSIS — M6281 Muscle weakness (generalized): Secondary | ICD-10-CM | POA: Diagnosis present

## 2020-10-16 DIAGNOSIS — M62838 Other muscle spasm: Secondary | ICD-10-CM | POA: Diagnosis present

## 2020-10-16 DIAGNOSIS — R278 Other lack of coordination: Secondary | ICD-10-CM | POA: Diagnosis present

## 2020-10-16 NOTE — Therapy (Signed)
[facility] [address], [place], [place], [address] Phone: [phone]   Fax:  [phone]  Physical Therapy Treatment  Patient Details  Name: Joanna Yang MRN: [id number] Date of Birth: 1970-05-18 Referring Provider (PT): Buddy Duty Marcello Moores   Encounter Date: 10/16/2020   PT End of Session - 10/16/20 1115     Visit Number 5    Date for PT Re-Evaluation 01/20/21    Authorization Type cigna    Authorization - Visit Number 5    Authorization - Number of Visits 90    PT Start Time 1100    PT Stop Time 1140    PT Time Calculation (min) 40 min    Activity Tolerance Patient tolerated treatment well    Behavior During Therapy [facility] for tasks assessed/performed             Past Medical History:  Diagnosis Date   Anemia    Anxiety    Complication of anesthesia    Endometrial polyp 03/22/2019   Family history of breast cancer    Fibroid    Heart murmur    pt. thinks she has a benign murmur dx'd during pregnancy   Mitral regurgitation    a. Prior 2D echo in 2014 showed technically limited study, EF 60%, mild MR, trace TR.   Pneumonia    PONV (postoperative nausea and vomiting)    Right ovarian cyst 07/29/2019    Past Surgical History:  Procedure Laterality Date   ANAL RECTAL MANOMETRY N/A 04/19/2020   Procedure: ANO RECTAL MANOMETRY;  Surgeon: Leighton Ruff, MD;  Location: [facility];  Service: Endoscopy;  Laterality: N/A;   CESAREAN SECTION  2009   COLONOSCOPY     CYSTOSCOPY N/A 12/18/2019   Procedure: CYSTOSCOPY;  Surgeon: Nunzio Cobbs, MD;  Location: [facility];  Service: Gynecology;  Laterality: N/A;   ESOPHAGOGASTRODUODENOSCOPY ENDOSCOPY     IUD REMOVAL  07/29/2019   expelling IUD   LAPAROTOMY N/A 12/21/2019   Procedure: EXPLORATORY LAPAROTOMY; RESECTION OF DESCENDING AND SIGMOID COLON AND TRANSVERSE COLOSTOMY;  Surgeon: Armandina Gemma, MD;  Location: [facility];  Service:  General;  Laterality: N/A;   TOTAL LAPAROSCOPIC HYSTERECTOMY WITH SALPINGECTOMY N/A 12/18/2019   Procedure: TOTAL LAPAROSCOPIC HYSTERECTOMY WITH SALPINGECTOMY and lysis of adhesions;  Surgeon: Nunzio Cobbs, MD;  Location: [facility];  Service: Gynecology;  Laterality: N/A;   TUBAL LIGATION  2009    There were no vitals filed for this visit.   Subjective Assessment - 10/16/20 1106     Subjective I have not been able to do the exericses due to being on vacation and having COVID.    Patient Stated Goals Be able to have the anal sphincter muscle ro work correctly to have her colonoscopy reversed    Currently in Pain? No/denies    Multiple Pain Sites No                [facility] PT Assessment - 10/16/20 0001       Assessment   Medical Diagnosis K56.41 Obstructive defecation    Referring Provider (PT) Dr.Alicia Thomas    Onset Date/Surgical Date --   11/2019   Prior Therapy none      Precautions   Precautions None      Restrictions   Weight Bearing Restrictions No      Prior Function   Level of Independence Independent      Cognition   Overall Cognitive  Status Within Functional Limits for tasks assessed      Observation/Other Assessments   Skin Integrity surgical site is tight and restricted midline of abdomen from the exploratory surgery      Posture/Postural Control   Posture/Postural Control No significant limitations      ROM / Strength   AROM / PROM / Strength AROM;PROM;Strength      AROM   Lumbar Extension decreased by 25%    Lumbar - Right Side Bend full    Lumbar - Left Side Bend full      Strength   Overall Strength Comments when contract the abdominals will hinge at the thoracolumbar junction and lift the lower rib cage      Palpation   SI assessment  ASIS are equal    Palpation comment no flaring of the ribs                        Pelvic Floor Special Questions - 10/16/20 0001     Number of C-Sections 1     Number of Vaginal Deliveries 1   tore alot; lots of stitches, increased vaginal swelling; had to self catherize   Urinary Leakage No    Fecal incontinence Yes   moucous comes out; sometimes has the urge to have a BM   Pelvic Floor Internal Exam Patient confirms identification and approves PT to assess pelvic floor and treatment    Exam Type Rectal;Vaginal    Palpation side of introitus, perineal body, urethra sphincter, using one finger internally and other externally to pull the tissue toward the midline, one finger internally and other externally to work on the perineal body    Strength weak squeeze, no lift   very small lift              [facility] Adult PT Treatment/Exercise - 10/16/20 0001       Neuro Re-ed    Neuro Re-ed Details  tactile cues to the introitus to form a circular contraction instead of anterior and posterior contraction      Manual Therapy   Manual Therapy Internal Pelvic Floor    Internal Pelvic Floor manual work to the introitus, along the urethra sphincter, manual mobilizaiton with one finger internal and other internal for the perineal body and along the sides of the introtus                      PT Short Term Goals - 08/28/20 1015       PT SHORT TERM GOAL #1   Title Independent with initial HEP for diaphragmatic breathing, hip stretches    Time 4    Period Weeks    Status Achieved      PT SHORT TERM GOAL #2   Title education on scar massge to improve mobiltiy    Time 4    Period Weeks    Status Achieved               PT Long Term Goals - 10/16/20 1108       PT LONG TERM GOAL #1   Title independent with advanced HEP for core    Time 4    Period Months    Status On-going      PT LONG TERM GOAL #2   Title able to bulge the pelvic floor while she is pushing the therapist finger out of the anal canal    Time 4    Period Months  Status On-going      PT LONG TERM GOAL #3   Title ability to contract the lower abdominals with  her core exercises    Time 4    Period Months    Status On-going      PT LONG TERM GOAL #4   Title able to return demonstration  correct toileting in sitting with relaxtion of the pelvic floor and correct breathing    Time 4    Period Months    Status On-going                   Plan - 10/16/20 1116     Clinical Impression Statement Patien thas not been able to perform her HEP since last visit due to being on vacation and having COVID. Patient pelvic floor muscles are tight. She will contract more the anterior and posterior aspect of the vaginal  canal. She has difficulty with contracting the sides of the introitus and needs tactile cues to assist. She has full lumbar side bending and extension decreased by 25%. Patient will raise her lower rib cage when she contracts her abodminals and hinge at the thoracic lumbar junction. Patient will benefit from skilled therapy to learn how to relax and bulge the pelvic floor for future bowel movements.    Personal Factors and Comorbidities Fitness;Comorbidity 2    Comorbidities Hysterectomy 12/18/2019; colostomy 12/21/2019;c-section 2009    Examination-Activity Limitations Toileting    Examination-Participation Restrictions Community Activity    Stability/Clinical Decision Making Stable/Uncomplicated    Rehab Potential Excellent    PT Frequency 1x / week    PT Duration Other (comment)   4 months   PT Treatment/Interventions ADLs/Self Care Home Management;Biofeedback;Therapeutic activities;Therapeutic exercise;Neuromuscular re-education;Patient/family education;Manual techniques;Dry needling;Scar mobilization;Spinal Manipulations    PT Next Visit Plan diaphragmatic breathing;  manual work internally on the anus and vaginal anal mobilization; coccy mobilization in quadruped, work on coordination of the pelvic floor contraction    PT Home Exercise Plan Access Code: [email or web address]    Recommended Other Services MD signed all notes    Consulted and  Agree with Plan of Care Patient             Patient will benefit from skilled therapeutic intervention in order to improve the following deficits and impairments:  Decreased coordination, Decreased range of motion, Increased fascial restricitons, Decreased endurance, Increased muscle spasms, Decreased activity tolerance, Decreased strength, Decreased scar mobility  Visit Diagnosis: Muscle weakness (generalized)  Other lack of coordination  Other muscle spasm     Problem List Patient Active Problem List   Diagnosis Date Noted   Internal hernia 12/21/2019   Colonic obstruction ([place]) 12/21/2019   Status post surgery 12/18/2019   Status post laparoscopic hysterectomy 12/18/2019   Genetic testing 10/07/2019   Family history of breast cancer    Discomfort in chest 10/29/2016   Chronic fatigue 10/29/2016   Allergic rhinitis due to allergen 10/29/2016   Vitamin D deficiency 10/29/2016    Earlie Counts, PT 10/16/20 11:46 AM  [facility] [address] [place], [place], [address] Phone: [phone]   Fax:  [phone]  Name: Joanna Yang MRN: [id number] Date of Birth: [birth year]

## 2020-10-17 ENCOUNTER — Ambulatory Visit
Admission: RE | Admit: 2020-10-17 | Discharge: 2020-10-17 | Disposition: A | Discharge status: Home / Self Care | Payer: Managed Care, Other (non HMO) | Source: Ambulatory Visit | Attending: Obstetrics and Gynecology | Admitting: Obstetrics and Gynecology

## 2020-10-17 DIAGNOSIS — Z1231 Encounter for screening mammogram for malignant neoplasm of breast: Secondary | ICD-10-CM

## 2020-10-23 ENCOUNTER — Other Ambulatory Visit: Payer: Self-pay

## 2020-10-23 ENCOUNTER — Encounter: Payer: Self-pay | Admitting: Physical Therapy

## 2020-10-23 ENCOUNTER — Ambulatory Visit: Payer: Managed Care, Other (non HMO) | Admitting: Physical Therapy

## 2020-10-23 DIAGNOSIS — M6281 Muscle weakness (generalized): Secondary | ICD-10-CM | POA: Diagnosis not present

## 2020-10-23 DIAGNOSIS — M62838 Other muscle spasm: Secondary | ICD-10-CM

## 2020-10-23 DIAGNOSIS — R278 Other lack of coordination: Secondary | ICD-10-CM

## 2020-10-23 NOTE — Patient Instructions (Signed)
Access Code: [email or web address] URL: [email or web address] Date: 10/23/2020 Prepared by: Earlie Counts  Exercises Sidelying Pelvic Floor Contraction with Self-Palpation - 2 x daily - 7 x weekly - 1 sets - 5 reps - 6 se hold Seated Pelvic Floor Contraction - 3 x daily - 7 x weekly - 1 sets - 5 reps - 3 sec hold  [facility] [address] [place], [place] [address] Phone # [phone] Fax [phone]

## 2020-10-23 NOTE — Therapy (Signed)
[facility] [address] [place], [place], [address] Phone: [phone]   Fax:  [phone]  Physical Therapy Treatment  Patient Details  Name: Joanna Yang MRN: [id number] Date of Birth: Dec 11, 1970 Referring Provider (PT): Dr.Alicia Marcello Moores   Encounter Date: 10/23/2020   PT End of Session - 10/23/20 1401     Visit Number 6    Date for PT Re-Evaluation 01/20/21    Authorization Type cigna    Authorization - Visit Number 6    Authorization - Number of Visits 90    PT Start Time [id number]    PT Stop Time 1308    PT Time Calculation (min) 38 min    Activity Tolerance Patient tolerated treatment well    Behavior During Therapy [facility] for tasks assessed/performed             Past Medical History:  Diagnosis Date   Anemia    Anxiety    Complication of anesthesia    Endometrial polyp 03/22/2019   Family history of breast cancer    Fibroid    Heart murmur    pt. thinks she has a benign murmur dx'd during pregnancy   Mitral regurgitation    a. Prior 2D echo in 2014 showed technically limited study, EF 60%, mild MR, trace TR.   Pneumonia    PONV (postoperative nausea and vomiting)    Right ovarian cyst 07/29/2019    Past Surgical History:  Procedure Laterality Date   ANAL RECTAL MANOMETRY N/A 04/19/2020   Procedure: ANO RECTAL MANOMETRY;  Surgeon: Leighton Ruff, MD;  Location: [facility];  Service: Endoscopy;  Laterality: N/A;   CESAREAN SECTION  2009   COLONOSCOPY     CYSTOSCOPY N/A 12/18/2019   Procedure: CYSTOSCOPY;  Surgeon: Nunzio Cobbs, MD;  [facility];  Service: Gynecology;  Laterality: N/A;   ESOPHAGOGASTRODUODENOSCOPY ENDOSCOPY     IUD REMOVAL  07/29/2019   expelling IUD   LAPAROTOMY N/A 12/21/2019   Procedure: EXPLORATORY LAPAROTOMY; RESECTION OF DESCENDING AND SIGMOID COLON AND TRANSVERSE COLOSTOMY;  Surgeon: Armandina Gemma, MD;  Location: [facility];  Service:  General;  Laterality: N/A;   TOTAL LAPAROSCOPIC HYSTERECTOMY WITH SALPINGECTOMY N/A 12/18/2019   Procedure: TOTAL LAPAROSCOPIC HYSTERECTOMY WITH SALPINGECTOMY and lysis of adhesions;  Surgeon: Nunzio Cobbs, MD;  Location: [facility];  Service: Gynecology;  Laterality: N/A;   TUBAL LIGATION  2009    There were no vitals filed for this visit.   Subjective Assessment - 10/23/20 1236     Subjective I have had abdominal pain and not sure why. I am still fatiqued from [place].    Patient Stated Goals Be able to have the anal sphincter muscle ro work correctly to have her colonoscopy reversed    Currently in Pain? No/denies                            Pelvic Floor Special Questions - 10/23/20 0001     Biofeedback sidely resting 2 uv, quick flick 34 uv wth good recruitment and relaxation, contracting abouve 14 uv for 10 seconds but difficult, trouble with relaxing after a 5 second contrraction; sitting 3 sec contraction and quick flicks (P)     Biofeedback sensor type Surface   rectal                      PT Education -  10/23/20 1310     Education Details Access Code: [email or web address]    Person(s) Educated Patient    Methods Explanation;Verbal cues;Handout    Comprehension Returned demonstration;Verbalized understanding              PT Short Term Goals - 08/28/20 1015       PT SHORT TERM GOAL #1   Title Independent with initial HEP for diaphragmatic breathing, hip stretches    Time 4    Period Weeks    Status Achieved      PT SHORT TERM GOAL #2   Title education on scar massge to improve mobiltiy    Time 4    Period Weeks    Status Achieved               PT Long Term Goals - 10/23/20 1407       PT LONG TERM GOAL #1   Title independent with advanced HEP for core    Time 4    Period Months    Status On-going      PT LONG TERM GOAL #2   Title able to bulge the pelvic floor while she is pushing the therapist  finger out of the anal canal    Time 4    Period Months    Status On-going      PT LONG TERM GOAL #3   Title ability to contract the lower abdominals with her core exercises    Time 4    Period Months    Status On-going      PT LONG TERM GOAL #4   Title able to return demonstration  correct toileting in sitting with relaxtion of the pelvic floor and correct breathing    Time 4    Period Months    Status On-going                   Plan - 10/23/20 1401     Clinical Impression Statement Patient is able to rest her pelvic floor to 2 uv. She has difficulty with contracting for more than 6 seconds in sidely and 3 seconds in sitting. Patient is able to quickly engage the pelvic floor and relax. She has difficulty with control of the pelvic floor contraction. Today she had some intestinal discomfort so we did not practice toileting technique. Patient does get a feeling of a bowel movement. Patient will benefit from skilled therapy to learn how to relax and bulge the pelvic floor for future bowel movements.    Personal Factors and Comorbidities Fitness;Comorbidity 2    Comorbidities Hysterectomy 12/18/2019; colostomy 12/21/2019;c-section 2009    Examination-Activity Limitations Toileting    Examination-Participation Restrictions Community Activity    Stability/Clinical Decision Making Stable/Uncomplicated    Rehab Potential Excellent    PT Frequency 1x / week    PT Duration Other (comment)   4 months   PT Treatment/Interventions ADLs/Self Care Home Management;Biofeedback;Therapeutic activities;Therapeutic exercise;Neuromuscular re-education;Patient/family education;Manual techniques;Dry needling;Scar mobilization;Spinal Manipulations    PT Next Visit Plan diaphragmatic breathing;  manual work internally on the anus and vaginal anal mobilization;  work on coordination of the pelvic floor contraction with pelvic EMG    PT Home Exercise Plan Access Code: [email or web address]    Consulted and Agree  with Plan of Care Patient             Patient will benefit from skilled therapeutic intervention in order to improve the following deficits and impairments:  Decreased coordination, Decreased range of motion, Increased fascial  restricitons, Decreased endurance, Increased muscle spasms, Decreased activity tolerance, Decreased strength, Decreased scar mobility  Visit Diagnosis: Muscle weakness (generalized)  Other lack of coordination  Other muscle spasm     Problem List Patient Active Problem List   Diagnosis Date Noted   Internal hernia 12/21/2019   Colonic obstruction ([place]) 12/21/2019   Status post surgery 12/18/2019   Status post laparoscopic hysterectomy 12/18/2019   Genetic testing 10/07/2019   Family history of breast cancer    Discomfort in chest 10/29/2016   Chronic fatigue 10/29/2016   Allergic rhinitis due to allergen 10/29/2016   Vitamin D deficiency 10/29/2016    Earlie Counts, PT 10/23/20 2:09 PM  [facility] [address] [place], [place], [address] Phone: [phone]   Fax:  [phone]  Name: Joanna Yang MRN: [id number] Date of Birth: [birth year]

## 2020-10-30 ENCOUNTER — Other Ambulatory Visit: Payer: Self-pay

## 2020-10-30 ENCOUNTER — Telehealth: Payer: Self-pay

## 2020-10-30 ENCOUNTER — Ambulatory Visit: Payer: Managed Care, Other (non HMO) | Attending: General Surgery | Admitting: Physical Therapy

## 2020-10-30 ENCOUNTER — Encounter: Payer: Self-pay | Admitting: Physical Therapy

## 2020-10-30 DIAGNOSIS — M6281 Muscle weakness (generalized): Secondary | ICD-10-CM | POA: Diagnosis not present

## 2020-10-30 DIAGNOSIS — M62838 Other muscle spasm: Secondary | ICD-10-CM | POA: Insufficient documentation

## 2020-10-30 DIAGNOSIS — R278 Other lack of coordination: Secondary | ICD-10-CM

## 2020-10-30 NOTE — Telephone Encounter (Signed)
Patient is scheduled Saturday, 11/02/20 at [facility] for MRI.  The rep called just now stating her [employer] requires PA.  She said they will need the info by Friday since her MRI is Saturday morning.

## 2020-10-30 NOTE — Telephone Encounter (Signed)
Prior Authorization obtained.  Encounter closed.

## 2020-10-30 NOTE — Patient Instructions (Signed)
Access Code: [email or web address] URL: [email or web address] Date: 10/30/2020 Prepared by: Earlie Counts  Exercises Supine Hamstring Stretch - 1 x daily - 7 x weekly - 1 sets - 2 reps - 30 sec hold Supine Piriformis Stretch with Leg Straight - 1 x daily - 7 x weekly - 1 sets - 2 reps - 30 sec hold Supine Pelvic Floor Stretch - 1 x daily - 7 x weekly - 1 sets - 1 reps - 30 sec hold V Sit Hip Adductor Hamstring Stretch - 1 x daily - 7 x weekly - 1 sets - 2 reps - 30 sec hold Cat-Camel - 1 x daily - 7 x weekly - 1 sets - 10 reps Child's Pose Stretch - 1 x daily - 7 x weekly - 1 sets - 1 reps - 30 sec hold Sidelying Pelvic Floor Contraction with Self-Palpation - 2 x daily - 7 x weekly - 1 sets - 5 reps - 10 se hold Seated Pelvic Floor Contraction - 3 x daily - 7 x weekly - 1 sets - 5 reps - 5 sec hold [facility] [address] [place], [place] [address] Phone # [phone] Fax [phone]

## 2020-10-30 NOTE — Therapy (Signed)
[facility] [address], [place], [place], [address] Phone: [phone]   Fax:  [phone]  Physical Therapy Treatment  Patient Details  Name: Joanna Yang MRN: [id number] Date of Birth: 1970-04-05 Referring Provider (PT): Buddy Duty Marcello Moores   Encounter Date: 10/30/2020   PT End of Session - 10/30/20 1237     Visit Number 7    Date for PT Re-Evaluation 01/20/21    Authorization Type cigna    Authorization - Visit Number 7    Authorization - Number of Visits 90    PT Start Time [id number]    PT Stop Time 1308    PT Time Calculation (min) 38 min    Activity Tolerance Patient tolerated treatment well    Behavior During Therapy [facility] for tasks assessed/performed             Past Medical History:  Diagnosis Date   Anemia    Anxiety    Complication of anesthesia    Endometrial polyp 03/22/2019   Family history of breast cancer    Fibroid    Heart murmur    pt. thinks she has a benign murmur dx'd during pregnancy   Mitral regurgitation    a. Prior 2D echo in 2014 showed technically limited study, EF 60%, mild MR, trace TR.   Pneumonia    PONV (postoperative nausea and vomiting)    Right ovarian cyst 07/29/2019    Past Surgical History:  Procedure Laterality Date   ANAL RECTAL MANOMETRY N/A 04/19/2020   Procedure: ANO RECTAL MANOMETRY;  Surgeon: Leighton Ruff, MD;  Location: [facility];  Service: Endoscopy;  Laterality: N/A;   CESAREAN SECTION  2009   COLONOSCOPY     CYSTOSCOPY N/A 12/18/2019   Procedure: CYSTOSCOPY;  Surgeon: Nunzio Cobbs, MD;  Location: [facility];  Service: Gynecology;  Laterality: N/A;   ESOPHAGOGASTRODUODENOSCOPY ENDOSCOPY     IUD REMOVAL  07/29/2019   expelling IUD   LAPAROTOMY N/A 12/21/2019   Procedure: EXPLORATORY LAPAROTOMY; RESECTION OF DESCENDING AND SIGMOID COLON AND TRANSVERSE COLOSTOMY;  Surgeon: Armandina Gemma, MD;  Location: [facility];  Service:  General;  Laterality: N/A;   TOTAL LAPAROSCOPIC HYSTERECTOMY WITH SALPINGECTOMY N/A 12/18/2019   Procedure: TOTAL LAPAROSCOPIC HYSTERECTOMY WITH SALPINGECTOMY and lysis of adhesions;  Surgeon: Nunzio Cobbs, MD;  Location: [facility];  Service: Gynecology;  Laterality: N/A;   TUBAL LIGATION  2009    There were no vitals filed for this visit.   Subjective Assessment - 10/30/20 1234     Subjective I have been doing my exercises.    Patient Stated Goals Be able to have the anal sphincter muscle ro work correctly to have her colonoscopy reversed    Currently in Pain? No/denies                            Pelvic Floor Special Questions - 10/30/20 0001     Biofeedback sitting resting tone is 1 uv,quick contraction 12 uv, holding 10 sec at 10 uv withholding breath; able to go quickly down and up.toileting with correct breathing and relaxation of the pelvic floor    Biofeedback sensor type Surface   rectal              [facility] Adult PT Treatment/Exercise - 10/30/20 0001       Lumbar Exercises: Aerobic   Nustep level 2 for 5 minutes while assessing  patient progress                  Upper Extremity Functional Index Score :   /80   PT Education - 10/30/20 1309     Education Details Access Code: [email or web address]    Person(s) Educated Patient    Methods Explanation;Demonstration;Verbal cues;Handout    Comprehension Returned demonstration;Verbalized understanding              PT Short Term Goals - 08/28/20 1015       PT SHORT TERM GOAL #1   Title Independent with initial HEP for diaphragmatic breathing, hip stretches    Time 4    Period Weeks    Status Achieved      PT SHORT TERM GOAL #2   Title education on scar massge to improve mobiltiy    Time 4    Period Weeks    Status Achieved               PT Long Term Goals - 10/30/20 1310       PT LONG TERM GOAL #1   Title independent with advanced HEP for core     Time 4    Period Months    Status On-going      PT LONG TERM GOAL #2   Title able to bulge the pelvic floor while she is pushing the therapist finger out of the anal canal    Time 4    Period Months    Status On-going      PT LONG TERM GOAL #3   Title ability to contract the lower abdominals with her core exercises    Time 4    Period Months    Status On-going      PT LONG TERM GOAL #4   Title able to return demonstration  correct toileting in sitting with relaxtion of the pelvic floor and correct breathing    Time 4    Period Months    Status Achieved                   Plan - 10/30/20 1238     Clinical Impression Statement Patient is consistent with her HEP. Patient is able to contract the pelvic floor quickly up and down now. Patient needs verbal cues to not hold her breath. Patientis able to hold her pelvic floor contraction for 5 seconds in sitting. Patient able to return demonstration of toileting with keeping her pelvic floor relaxed and correct breathing. Patient will benefit from skilled therapy to learn how to relax and bulge the pelvic floor for future bowel movements.    Personal Factors and Comorbidities Fitness;Comorbidity 2    Comorbidities Hysterectomy 12/18/2019; colostomy 12/21/2019;c-section 2009    Examination-Activity Limitations Toileting    Examination-Participation Restrictions Community Activity    Stability/Clinical Decision Making Stable/Uncomplicated    Rehab Potential Excellent    PT Frequency 1x / week    PT Duration Other (comment)   4 months   PT Treatment/Interventions ADLs/Self Care Home Management;Biofeedback;Therapeutic activities;Therapeutic exercise;Neuromuscular re-education;Patient/family education;Manual techniques;Dry needling;Scar mobilization;Spinal Manipulations    PT Next Visit Plan work on lower abdominal contraction exercise with EMG; contracting progressively with the pelvic floor    PT Home Exercise Plan Access Code:  [email or web address]    Consulted and Agree with Plan of Care Patient             Patient will benefit from skilled therapeutic intervention in order to improve the following deficits and impairments:  Decreased coordination, Decreased range of motion, Increased fascial restricitons, Decreased endurance, Increased muscle spasms, Decreased activity tolerance, Decreased strength, Decreased scar mobility  Visit Diagnosis: Muscle weakness (generalized)  Other lack of coordination  Other muscle spasm     Problem List Patient Active Problem List   Diagnosis Date Noted   Internal hernia 12/21/2019   Colonic obstruction ([place]) 12/21/2019   Status post surgery 12/18/2019   Status post laparoscopic hysterectomy 12/18/2019   Genetic testing 10/07/2019   Family history of breast cancer    Discomfort in chest 10/29/2016   Chronic fatigue 10/29/2016   Allergic rhinitis due to allergen 10/29/2016   Vitamin D deficiency 10/29/2016    Earlie Counts, PT 10/30/20 1:13 PM  [facility] [address], [place], [place], [address] Phone: [phone]   Fax:  [phone]  Name: Joanna Yang MRN: [id number] Date of Birth: 1970/06/18

## 2020-11-02 ENCOUNTER — Ambulatory Visit
Admission: RE | Admit: 2020-11-02 | Discharge: 2020-11-02 | Disposition: A | Discharge status: Home / Self Care | Payer: Managed Care, Other (non HMO) | Source: Ambulatory Visit | Attending: Obstetrics and Gynecology | Admitting: Obstetrics and Gynecology

## 2020-11-02 ENCOUNTER — Other Ambulatory Visit: Payer: Self-pay

## 2020-11-02 DIAGNOSIS — Z9189 Other specified personal risk factors, not elsewhere classified: Secondary | ICD-10-CM

## 2020-11-02 MED ORDER — GADOBUTROL 1 MMOL/ML IV SOLN
7.0000 mL | Freq: Once | INTRAVENOUS | Status: AC | PRN
  Administered 2020-11-02: 7 mL via INTRAVENOUS

## 2020-11-06 ENCOUNTER — Ambulatory Visit: Payer: Managed Care, Other (non HMO) | Admitting: Physical Therapy

## 2020-11-06 ENCOUNTER — Other Ambulatory Visit: Payer: Self-pay

## 2020-11-06 ENCOUNTER — Encounter: Payer: Self-pay | Admitting: Physical Therapy

## 2020-11-06 DIAGNOSIS — M6281 Muscle weakness (generalized): Secondary | ICD-10-CM | POA: Diagnosis not present

## 2020-11-06 DIAGNOSIS — M62838 Other muscle spasm: Secondary | ICD-10-CM

## 2020-11-06 DIAGNOSIS — R278 Other lack of coordination: Secondary | ICD-10-CM

## 2020-11-06 NOTE — Patient Instructions (Signed)
Access Code: [email or web address] URL: [email or web address] Date: 11/06/2020 Prepared by: Earlie Counts  Program Notes 3 quick contraction that increase after each one then rest 10 seconds 1x/day   contract up 3 steps  holding 3 seconds each then down 3 steps for 5 times      Exercises Supine Hamstring Stretch - 1 x daily - 7 x weekly - 1 sets - 2 reps - 30 sec hold Supine Piriformis Stretch with Leg Straight - 1 x daily - 7 x weekly - 1 sets - 2 reps - 30 sec hold Supine Pelvic Floor Stretch - 1 x daily - 7 x weekly - 1 sets - 1 reps - 30 sec hold V Sit Hip Adductor Hamstring Stretch - 1 x daily - 7 x weekly - 1 sets - 2 reps - 30 sec hold Cat-Camel - 1 x daily - 7 x weekly - 1 sets - 10 reps Child's Pose Stretch - 1 x daily - 7 x weekly - 1 sets - 1 reps - 30 sec hold Sidelying Pelvic Floor Contraction with Self-Palpation - 2 x daily - 7 x weekly - 1 sets - 5 reps - 10 se hold Seated Pelvic Floor Contraction - 3 x daily - 7 x weekly - 1 sets - 5 reps - 5 sec hold  [facility] [address], [place], [place] [address] Phone # [phone] Fax [phone]

## 2020-11-06 NOTE — Therapy (Signed)
[facility] [address] [place], [place], [address] Phone: [phone]   Fax:  [phone]  Physical Therapy Treatment  Patient Details  Name: Joanna Yang MRN: [id number] Date of Birth: 01-24-1971 Referring Provider (PT): Buddy Duty Marcello Moores   Encounter Date: 11/06/2020   PT End of Session - 11/06/20 1306     Visit Number 8    Authorization Type cigna    Authorization - Visit Number 8    Authorization - Number of Visits 90    PT Stop Time 1230    Activity Tolerance Patient tolerated treatment well    Behavior During Therapy [facility] for tasks assessed/performed             Past Medical History:  Diagnosis Date   Anemia    Anxiety    Complication of anesthesia    Endometrial polyp 03/22/2019   Family history of breast cancer    Fibroid    Heart murmur    pt. thinks she has a benign murmur dx'd during pregnancy   Mitral regurgitation    a. Prior 2D echo in 2014 showed technically limited study, EF 60%, mild MR, trace TR.   Pneumonia    PONV (postoperative nausea and vomiting)    Right ovarian cyst 07/29/2019    Past Surgical History:  Procedure Laterality Date   ANAL RECTAL MANOMETRY N/A 04/19/2020   Procedure: ANO RECTAL MANOMETRY;  Surgeon: Leighton Ruff, MD;  [facility];  Service: Endoscopy;  Laterality: N/A;   CESAREAN SECTION  2009   COLONOSCOPY     CYSTOSCOPY N/A 12/18/2019   Procedure: CYSTOSCOPY;  Surgeon: Nunzio Cobbs, MD;  Location: [facility];  Service: Gynecology;  Laterality: N/A;   ESOPHAGOGASTRODUODENOSCOPY ENDOSCOPY     IUD REMOVAL  07/29/2019   expelling IUD   LAPAROTOMY N/A 12/21/2019   Procedure: EXPLORATORY LAPAROTOMY; RESECTION OF DESCENDING AND SIGMOID COLON AND TRANSVERSE COLOSTOMY;  Surgeon: Armandina Gemma, MD;  [facility];  Service: General;  Laterality: N/A;   TOTAL LAPAROSCOPIC HYSTERECTOMY WITH SALPINGECTOMY N/A 12/18/2019    Procedure: TOTAL LAPAROSCOPIC HYSTERECTOMY WITH SALPINGECTOMY and lysis of adhesions;  Surgeon: Nunzio Cobbs, MD;  Location: [facility];  Service: Gynecology;  Laterality: N/A;   TUBAL LIGATION  2009    There were no vitals filed for this visit.   Subjective Assessment - 11/06/20 1241     Subjective I am thinking about a rowing machine. I am getting my energy back. I am squeezing my pelvic floor throughout the day.    Patient Stated Goals Be able to have the anal sphincter muscle ro work correctly to have her colonoscopy reversed    Currently in Pain? No/denies    Multiple Pain Sites No                            Pelvic Floor Special Questions - 11/06/20 0001     Biofeedback sitting- quickly contract and relax, contract 5 sec at 10 uv; perform steps with pelvic floor going upward; increased control; 3 progressively quick onctractions; going up and down steps    Biofeedback sensor type Surface   rectal              [facility] Adult PT Treatment/Exercise - 11/06/20 0001       Lumbar Exercises: Aerobic   Nustep level 2 for 5 minutes while assessing patient progress  PT Education - 11/06/20 1306     Education Details Access Code: [email or web address]    Person(s) Educated Patient    Methods Explanation;Handout;Demonstration    Comprehension Verbalized understanding;Returned demonstration              PT Short Term Goals - 08/28/20 1015       PT SHORT TERM GOAL #1   Title Independent with initial HEP for diaphragmatic breathing, hip stretches    Time 4    Period Weeks    Status Achieved      PT SHORT TERM GOAL #2   Title education on scar massge to improve mobiltiy    Time 4    Period Weeks    Status Achieved               PT Long Term Goals - 10/30/20 1310       PT LONG TERM GOAL #1   Title independent with advanced HEP for core    Time 4    Period Months    Status On-going      PT  LONG TERM GOAL #2   Title able to bulge the pelvic floor while she is pushing the therapist finger out of the anal canal    Time 4    Period Months    Status On-going      PT LONG TERM GOAL #3   Title ability to contract the lower abdominals with her core exercises    Time 4    Period Months    Status On-going      PT LONG TERM GOAL #4   Title able to return demonstration  correct toileting in sitting with relaxtion of the pelvic floor and correct breathing    Time 4    Period Months    Status Achieved                   Plan - 11/06/20 1307     Clinical Impression Statement Patient is working on control of the pelvic floor muscles with progressive quick flicks, going up and down steps, and holding for 5 seconds in sitting. Patient has the most trouble with going up and down steps for control. Patient is not holding her breath with contraction. She is able to contract with higher uv and longer period of time. Patient has good recruitment and relaxation of the muscles. Patient will benefit from skilled therapy to learn how to improve pelvic floor control.    Personal Factors and Comorbidities Fitness;Comorbidity 2    Comorbidities Hysterectomy 12/18/2019; colostomy 12/21/2019;c-section 2009    Examination-Activity Limitations Toileting    Examination-Participation Restrictions Community Activity    Stability/Clinical Decision Making Stable/Uncomplicated    Rehab Potential Excellent    PT Frequency 1x / week    PT Duration Other (comment)   4 months   PT Treatment/Interventions ADLs/Self Care Home Management;Biofeedback;Therapeutic activities;Therapeutic exercise;Neuromuscular re-education;Patient/family education;Manual techniques;Dry needling;Scar mobilization;Spinal Manipulations    PT Next Visit Plan work on lower abdominal contraction exercise with EMG; standing pelvic floor exercise    PT Home Exercise Plan Access Code: [email or web address]    Consulted and Agree with Plan of Care  Patient             Patient will benefit from skilled therapeutic intervention in order to improve the following deficits and impairments:  Decreased coordination, Decreased range of motion, Increased fascial restricitons, Decreased endurance, Increased muscle spasms, Decreased activity tolerance, Decreased strength, Decreased scar mobility  Visit Diagnosis: Muscle weakness (  generalized)  Other lack of coordination  Other muscle spasm     Problem List Patient Active Problem List   Diagnosis Date Noted   Internal hernia 12/21/2019   Colonic obstruction ([place]) 12/21/2019   Status post surgery 12/18/2019   Status post laparoscopic hysterectomy 12/18/2019   Genetic testing 10/07/2019   Family history of breast cancer    Discomfort in chest 10/29/2016   Chronic fatigue 10/29/2016   Allergic rhinitis due to allergen 10/29/2016   Vitamin D deficiency 10/29/2016    Earlie Counts, PT 11/06/20 1:16 PM  [facility] [address], [place], [place], [address] Phone: [phone]   Fax:  [phone]  Name: Pricila Gin MRN: [id number] Date of Birth: 07-12-1970

## 2020-11-13 ENCOUNTER — Other Ambulatory Visit: Payer: Self-pay

## 2020-11-13 ENCOUNTER — Ambulatory Visit: Payer: Managed Care, Other (non HMO) | Admitting: Physical Therapy

## 2020-11-13 ENCOUNTER — Encounter: Payer: Self-pay | Admitting: Physical Therapy

## 2020-11-13 DIAGNOSIS — M6281 Muscle weakness (generalized): Secondary | ICD-10-CM

## 2020-11-13 DIAGNOSIS — M62838 Other muscle spasm: Secondary | ICD-10-CM

## 2020-11-13 DIAGNOSIS — R278 Other lack of coordination: Secondary | ICD-10-CM

## 2020-11-13 NOTE — Therapy (Signed)
[facility] [address], [place], [place], [address] Phone: [phone]   Fax:  [phone]  Physical Therapy Treatment  Patient Details  Name: Joanna Yang MRN: [id number] Date of Birth: 1971-01-19 Referring Provider (PT): Buddy Duty Marcello Moores   Encounter Date: 11/13/2020   PT End of Session - 11/13/20 1315     Visit Number 9    Date for PT Re-Evaluation 01/20/21    Authorization Type cigna    Authorization - Visit Number 9    Authorization - Number of Visits 90    PT Start Time 0263    PT Stop Time 1310    PT Time Calculation (min) 40 min    Activity Tolerance Patient tolerated treatment well    Behavior During Therapy [facility] for tasks assessed/performed             Past Medical History:  Diagnosis Date   Anemia    Anxiety    Complication of anesthesia    Endometrial polyp 03/22/2019   Family history of breast cancer    Fibroid    Heart murmur    pt. thinks she has a benign murmur dx'd during pregnancy   Mitral regurgitation    a. Prior 2D echo in 2014 showed technically limited study, EF 60%, mild MR, trace TR.   Pneumonia    PONV (postoperative nausea and vomiting)    Right ovarian cyst 07/29/2019    Past Surgical History:  Procedure Laterality Date   ANAL RECTAL MANOMETRY N/A 04/19/2020   Procedure: ANO RECTAL MANOMETRY;  Surgeon: Leighton Ruff, MD;  [facility];  Service: Endoscopy;  Laterality: N/A;   CESAREAN SECTION  2009   COLONOSCOPY     CYSTOSCOPY N/A 12/18/2019   Procedure: CYSTOSCOPY;  Surgeon: Nunzio Cobbs, MD;  Location: [facility];  Service: Gynecology;  Laterality: N/A;   ESOPHAGOGASTRODUODENOSCOPY ENDOSCOPY     IUD REMOVAL  07/29/2019   expelling IUD   LAPAROTOMY N/A 12/21/2019   Procedure: EXPLORATORY LAPAROTOMY; RESECTION OF DESCENDING AND SIGMOID COLON AND TRANSVERSE COLOSTOMY;  Surgeon: Armandina Gemma, MD;  [facility];  Service:  General;  Laterality: N/A;   TOTAL LAPAROSCOPIC HYSTERECTOMY WITH SALPINGECTOMY N/A 12/18/2019   Procedure: TOTAL LAPAROSCOPIC HYSTERECTOMY WITH SALPINGECTOMY and lysis of adhesions;  Surgeon: Nunzio Cobbs, MD;  Location: [facility];  Service: Gynecology;  Laterality: N/A;   TUBAL LIGATION  2009    There were no vitals filed for this visit.   Subjective Assessment - 11/13/20 1236     Subjective No changes from last visit.    Patient Stated Goals Be able to have the anal sphincter muscle ro work correctly to have her colonoscopy reversed    Currently in Pain? No/denies    Multiple Pain Sites No                               [facility] Adult PT Treatment/Exercise - 11/13/20 0001       Lumbar Exercises: Aerobic   Nustep level 3 for 6 minutes while assessing patient progress      Lumbar Exercises: Sidelying   Other Sidelying Lumbar Exercises side plank 10x on each side with contracting the pelvic floor      Lumbar Exercises: Quadruped   Opposite Arm/Leg Raise Right arm/Left leg;Left arm/Right leg;10 reps;1 second    Opposite Arm/Leg Raise Limitations with pelvic floor contraction  PT Education - 11/13/20 1314     Education Details Access Code: [email or web address]    Person(s) Educated Patient    Methods Explanation;Demonstration;Verbal cues;Handout    Comprehension Returned demonstration;Verbalized understanding              PT Short Term Goals - 08/28/20 1015       PT SHORT TERM GOAL #1   Title Independent with initial HEP for diaphragmatic breathing, hip stretches    Time 4    Period Weeks    Status Achieved      PT SHORT TERM GOAL #2   Title education on scar massge to improve mobiltiy    Time 4    Period Weeks    Status Achieved               PT Long Term Goals - 11/13/20 1320       PT LONG TERM GOAL #1   Title independent with advanced HEP for core    Time 4    Period Months     Status On-going      PT LONG TERM GOAL #2   Title able to bulge the pelvic floor while she is pushing the therapist finger out of the anal canal    Time 4    Period Months    Status On-going      PT LONG TERM GOAL #3   Title ability to contract the lower abdominals with her core exercises    Time 4    Period Months    Status On-going      PT LONG TERM GOAL #4   Title able to return demonstration  correct toileting in sitting with relaxtion of the pelvic floor and correct breathing    Time 4    Period Months    Status Achieved                   Plan - 11/13/20 1315     Clinical Impression Statement Patient is learning how to increased her strength of her core with pelvic floor.She needs tactile cues with quadruped to not move her trunk when lifting an extremity. Patient was tired with her core workout. Patient is very consistent with her program. Patient will benefit from skilled therapy to learn how to improve pelvic floor control.    Personal Factors and Comorbidities Fitness;Comorbidity 2    Comorbidities Hysterectomy 12/18/2019; colostomy 12/21/2019;c-section 2009    Examination-Participation Restrictions Community Activity    Stability/Clinical Decision Making Stable/Uncomplicated    Rehab Potential Excellent    PT Frequency 1x / week    PT Duration Other (comment)   4 months   PT Treatment/Interventions ADLs/Self Care Home Management;Biofeedback;Therapeutic activities;Therapeutic exercise;Neuromuscular re-education;Patient/family education;Manual techniques;Dry needling;Scar mobilization;Spinal Manipulations    PT Next Visit Plan work on control going up and down steps with EMG, review HEP with using the EMG    PT Home Exercise Plan Access Code: [email or web address]    Consulted and Agree with Plan of Care Patient             Patient will benefit from skilled therapeutic intervention in order to improve the following deficits and impairments:  Decreased coordination,  Decreased range of motion, Increased fascial restricitons, Decreased endurance, Increased muscle spasms, Decreased activity tolerance, Decreased strength, Decreased scar mobility  Visit Diagnosis: Muscle weakness (generalized)  Other lack of coordination  Other muscle spasm     Problem List Patient Active Problem List   Diagnosis Date Noted   Internal  hernia 12/21/2019   Colonic obstruction ([place]) 12/21/2019   Status post surgery 12/18/2019   Status post laparoscopic hysterectomy 12/18/2019   Genetic testing 10/07/2019   Family history of breast cancer    Discomfort in chest 10/29/2016   Chronic fatigue 10/29/2016   Allergic rhinitis due to allergen 10/29/2016   Vitamin D deficiency 10/29/2016    Earlie Counts, PT 11/13/20 1:22 PM  [facility] [address] [place], [place], [address] Phone: [phone]   Fax:  [phone]  Name: Joanna Yang MRN: [id number] Date of Birth: 1970-09-22

## 2020-11-13 NOTE — Patient Instructions (Signed)
Access Code: [email or web address] URL: [email or web address] Date: 11/13/2020 Prepared by: Earlie Counts  Program Notes 3 quick contraction that increase after each one then rest 10 seconds 1x/day   contract up 3 steps  holding 3 seconds each then down 3 steps for 5 times      Exercises Supine Hamstring Stretch - 1 x daily - 7 x weekly - 1 sets - 2 reps - 30 sec hold Supine Piriformis Stretch with Leg Straight - 1 x daily - 7 x weekly - 1 sets - 2 reps - 30 sec hold Supine Pelvic Floor Stretch - 1 x daily - 7 x weekly - 1 sets - 1 reps - 30 sec hold V Sit Hip Adductor Hamstring Stretch - 1 x daily - 7 x weekly - 1 sets - 2 reps - 30 sec hold Cat-Camel - 1 x daily - 7 x weekly - 1 sets - 10 reps Child's Pose Stretch - 1 x daily - 7 x weekly - 1 sets - 1 reps - 30 sec hold Seated Pelvic Floor Contraction - 3 x daily - 7 x weekly - 1 sets - 5 reps - 5 sec hold Supine March - 1 x daily - 1 x weekly - 1 sets - 10 reps - 1 hold Bridge with Shoulder Extension and Resistance - 1 x daily - 3 x weekly - 1 sets - 10 reps Quadruped Pelvic Floor Contraction with Opposite Arm and Leg Lift - 1 x daily - 3 x weekly - 1 sets - 10 reps Sit to Stand with Resistance Around Legs - 1 x daily - 3 x weekly - 1 sets - 10 reps Side Plank on Knees - 1 x daily - 7 x weekly - 1 sets - 10 reps  [facility] [address], [place], [place] [address] Phone # [phone] Fax [phone]

## 2020-11-18 ENCOUNTER — Encounter: Payer: Self-pay | Admitting: Physical Therapy

## 2020-11-18 ENCOUNTER — Other Ambulatory Visit: Payer: Self-pay

## 2020-11-18 ENCOUNTER — Ambulatory Visit: Payer: Managed Care, Other (non HMO) | Admitting: Physical Therapy

## 2020-11-18 DIAGNOSIS — M6281 Muscle weakness (generalized): Secondary | ICD-10-CM | POA: Diagnosis not present

## 2020-11-18 DIAGNOSIS — M62838 Other muscle spasm: Secondary | ICD-10-CM

## 2020-11-18 DIAGNOSIS — R278 Other lack of coordination: Secondary | ICD-10-CM

## 2020-11-18 NOTE — Therapy (Signed)
[facility] [address], [place], [place], [address] Phone: [phone]   Fax:  [phone]  Physical Therapy Treatment  Patient Details  Name: Joanna Yang MRN: [id number] Date of Birth: Jul 29, 1970 Referring Provider (PT): Buddy Duty Marcello Moores   Encounter Date: 11/18/2020   PT End of Session - 11/18/20 1309     Visit Number 10    Date for PT Re-Evaluation 01/20/21    Authorization Type cigna    Authorization - Visit Number 10    Authorization - Number of Visits 90    PT Start Time 1230    PT Stop Time 1310    PT Time Calculation (min) 40 min    Activity Tolerance Patient tolerated treatment well    Behavior During Therapy [facility] for tasks assessed/performed             Past Medical History:  Diagnosis Date   Anemia    Anxiety    Complication of anesthesia    Endometrial polyp 03/22/2019   Family history of breast cancer    Fibroid    Heart murmur    pt. thinks she has a benign murmur dx'd during pregnancy   Mitral regurgitation    a. Prior 2D echo in 2014 showed technically limited study, EF 60%, mild MR, trace TR.   Pneumonia    PONV (postoperative nausea and vomiting)    Right ovarian cyst 07/29/2019    Past Surgical History:  Procedure Laterality Date   ANAL RECTAL MANOMETRY N/A 04/19/2020   Procedure: ANO RECTAL MANOMETRY;  Surgeon: Leighton Ruff, MD;  Location: [facility];  Service: Endoscopy;  Laterality: N/A;   CESAREAN SECTION  2009   COLONOSCOPY     CYSTOSCOPY N/A 12/18/2019   Procedure: CYSTOSCOPY;  Surgeon: Nunzio Cobbs, MD;  Location[facility];  Service: Gynecology;  Laterality: N/A;   ESOPHAGOGASTRODUODENOSCOPY ENDOSCOPY     IUD REMOVAL  07/29/2019   expelling IUD   LAPAROTOMY N/A 12/21/2019   Procedure: EXPLORATORY LAPAROTOMY; RESECTION OF DESCENDING AND SIGMOID COLON AND TRANSVERSE COLOSTOMY;  Surgeon: Armandina Gemma, MD;  Location: [facility];  Service:  General;  Laterality: N/A;   TOTAL LAPAROSCOPIC HYSTERECTOMY WITH SALPINGECTOMY N/A 12/18/2019   Procedure: TOTAL LAPAROSCOPIC HYSTERECTOMY WITH SALPINGECTOMY and lysis of adhesions;  Surgeon: Nunzio Cobbs, MD;  Location[facility];  Service: Gynecology;  Laterality: N/A;   TUBAL LIGATION  2009    There were no vitals filed for this visit.   Subjective Assessment - 11/18/20 1233     Subjective I got invovled with a house project so I did not do the extra exercises. I think I will do my surgery next year.    Patient Stated Goals Be able to have the anal sphincter muscle ro work correctly to have her colonoscopy reversed    Currently in Pain? No/denies    Multiple Pain Sites No                            Pelvic Floor Special Questions - 11/18/20 0001     Biofeedback sidely steps up and down; Trapezoid program to work on control in ssidely and sitting    Biofeedback sensor type Surface   rectal              [facility] Adult PT Treatment/Exercise - 11/18/20 0001       Lumbar Exercises: Aerobic   Stationary  Bike 6 minutes level 2      Lumbar Exercises: Sidelying   Other Sidelying Lumbar Exercises side plank 10x on each side with contracting the pelvic floor      Lumbar Exercises: Quadruped   Opposite Arm/Leg Raise Right arm/Left leg;Left arm/Right leg;10 reps;1 second    Opposite Arm/Leg Raise Limitations with pelvic floor contraction      Knee/Hip Exercises: Standing   Other Standing Knee Exercises sit to stand with yellow circular band around thighs 10x                       PT Short Term Goals - 08/28/20 1015       PT SHORT TERM GOAL #1   Title Independent with initial HEP for diaphragmatic breathing, hip stretches    Time 4    Period Weeks    Status Achieved      PT SHORT TERM GOAL #2   Title education on scar massge to improve mobiltiy    Time 4    Period Weeks    Status Achieved                PT Long Term Goals - 11/18/20 1316       PT LONG TERM GOAL #1   Title independent with advanced HEP for core    Time 4    Period Months    Status On-going      PT LONG TERM GOAL #2   Title able to bulge the pelvic floor while she is pushing the therapist finger out of the anal canal    Time 4    Period Months    Status On-going      PT LONG TERM GOAL #3   Title ability to contract the lower abdominals with her core exercises    Time 4    Period Months    Status On-going      PT LONG TERM GOAL #4   Title able to return demonstration  correct toileting in sitting with relaxtion of the pelvic floor and correct breathing    Time 4    Period Months    Status Achieved                   Plan - 11/18/20 1309     Clinical Impression Statement Patient is still working on her core exercises. She had difficulty with control today using the steps and trapezoid program. Patient is able to relax her pelvic floor to 2 uv. Patient is able to hold a contract for 3 seconds then relax a little and contract back to the range she needed. Patient able to have increased control with her exercises in quadruped and side plank. Patient will benefit from skilled therapy to work on pelvic floor control.    Personal Factors and Comorbidities Fitness;Comorbidity 2    Comorbidities Hysterectomy 12/18/2019; colostomy 12/21/2019;c-section 2009    Examination-Activity Limitations Toileting    Examination-Participation Restrictions Community Activity    Stability/Clinical Decision Making Stable/Uncomplicated    Rehab Potential Excellent    PT Frequency 1x / week    PT Duration Other (comment)   4 months   PT Treatment/Interventions ADLs/Self Care Home Management;Biofeedback;Therapeutic activities;Therapeutic exercise;Neuromuscular re-education;Patient/family education;Manual techniques;Dry needling;Scar mobilization;Spinal Manipulations    PT Next Visit Plan work on control going up and down steps with  EMG, trapezoid program    PT Home Exercise Plan Access Code: [email or web address]    Consulted and Agree with Plan of  Care Patient             Patient will benefit from skilled therapeutic intervention in order to improve the following deficits and impairments:  Decreased coordination, Decreased range of motion, Increased fascial restricitons, Decreased endurance, Increased muscle spasms, Decreased activity tolerance, Decreased strength, Decreased scar mobility  Visit Diagnosis: Muscle weakness (generalized)  Other lack of coordination  Other muscle spasm     Problem List Patient Active Problem List   Diagnosis Date Noted   Internal hernia 12/21/2019   Colonic obstruction ([place]) 12/21/2019   Status post surgery 12/18/2019   Status post laparoscopic hysterectomy 12/18/2019   Genetic testing 10/07/2019   Family history of breast cancer    Discomfort in chest 10/29/2016   Chronic fatigue 10/29/2016   Allergic rhinitis due to allergen 10/29/2016   Vitamin D deficiency 10/29/2016    Earlie Counts, PT 11/18/20 1:17 PM  [facility] [address], [place], [place], [address] Phone: [phone]   Fax:  [phone]  Name: Joanna Yang MRN: [id number] Date of Birth: 11-19-1970

## 2020-11-27 ENCOUNTER — Ambulatory Visit: Payer: Managed Care, Other (non HMO) | Attending: General Surgery | Admitting: Physical Therapy

## 2020-11-27 ENCOUNTER — Other Ambulatory Visit: Payer: Self-pay

## 2020-11-27 ENCOUNTER — Encounter: Payer: Self-pay | Admitting: Physical Therapy

## 2020-11-27 DIAGNOSIS — R278 Other lack of coordination: Secondary | ICD-10-CM | POA: Insufficient documentation

## 2020-11-27 DIAGNOSIS — M6281 Muscle weakness (generalized): Secondary | ICD-10-CM | POA: Insufficient documentation

## 2020-11-27 DIAGNOSIS — M62838 Other muscle spasm: Secondary | ICD-10-CM | POA: Insufficient documentation

## 2020-11-27 NOTE — Therapy (Signed)
[facility] @ [place], [place], [address] Phone:     Fax:     Physical Therapy Treatment  Patient Details  Name: Joanna Yang MRN: [id number] Date of Birth: [birth year] Referring Provider (PT): Buddy Duty Marcello Moores   Encounter Date: 11/27/2020   PT End of Session - 11/27/20 1256     Visit Number 11    Date for PT Re-Evaluation 01/20/21    Authorization Type cigna    Authorization - Visit Number 11    Authorization - Number of Visits 90    PT Start Time 6213    PT Stop Time 1308    PT Time Calculation (min) 38 min    Activity Tolerance Patient tolerated treatment well    Behavior During Therapy [facility] for tasks assessed/performed             Past Medical History:  Diagnosis Date   Anemia    Anxiety    Complication of anesthesia    Endometrial polyp 03/22/2019   Family history of breast cancer    Fibroid    Heart murmur    pt. thinks she has a benign murmur dx'd during pregnancy   Mitral regurgitation    a. Prior 2D echo in 2014 showed technically limited study, EF 60%, mild MR, trace TR.   Pneumonia    PONV (postoperative nausea and vomiting)    Right ovarian cyst 07/29/2019    Past Surgical History:  Procedure Laterality Date   ANAL RECTAL MANOMETRY N/A 04/19/2020   Procedure: ANO RECTAL MANOMETRY;  Surgeon: Leighton Ruff, MD;  Location: [facility];  Service: Endoscopy;  Laterality: N/A;   CESAREAN SECTION  2009   COLONOSCOPY     CYSTOSCOPY N/A 12/18/2019   Procedure: CYSTOSCOPY;  Surgeon: Nunzio Cobbs, MD;  Location[facility];  Service: Gynecology;  Laterality: N/A;   ESOPHAGOGASTRODUODENOSCOPY ENDOSCOPY     IUD REMOVAL  07/29/2019   expelling IUD   LAPAROTOMY N/A 12/21/2019   Procedure: EXPLORATORY LAPAROTOMY; RESECTION OF DESCENDING AND SIGMOID COLON AND TRANSVERSE COLOSTOMY;  Surgeon: Armandina Gemma, MD;  Location: [facility];  Service: General;  Laterality: N/A;    TOTAL LAPAROSCOPIC HYSTERECTOMY WITH SALPINGECTOMY N/A 12/18/2019   Procedure: TOTAL LAPAROSCOPIC HYSTERECTOMY WITH SALPINGECTOMY and lysis of adhesions;  Surgeon: Nunzio Cobbs, MD;  Location: [facility];  Service: Gynecology;  Laterality: N/A;   TUBAL LIGATION  2009    There were no vitals filed for this visit.   Subjective Assessment - 11/27/20 1236     Subjective I am ready for discharge.    Patient Stated Goals Be able to have the anal sphincter muscle ro work correctly to have her colonoscopy reversed    Currently in Pain? No/denies    Multiple Pain Sites No                [facility] PT Assessment - 11/27/20 0001       Assessment   Medical Diagnosis K56.41 Obstructive defecation    Referring Provider (PT) Dr.Alicia Thomas    Onset Date/Surgical Date --   11/2019   Prior Therapy none      Precautions   Precautions None      Restrictions   Weight Bearing Restrictions No      Prior Function   Level of Independence Independent      Cognition   Overall Cognitive Status Within Functional Limits for tasks assessed  Observation/Other Assessments   Skin Integrity surgical site is tight and restricted midline of abdomen from the exploratory surgery      Posture/Postural Control   Posture/Postural Control No significant limitations      AROM   Lumbar Extension decreased by 25%    Lumbar - Right Side Bend full    Lumbar - Left Side Bend full      Palpation   SI assessment  ASIS are equal    Palpation comment no flaring of the ribs                        Pelvic Floor Special Questions - 11/27/20 0001     Biofeedback sidely working on trapezoid program with resting tone at 4.5 uv and able to follow the line; 2 sided steps in sidely; sitting to facilitate a bowel movement keeping her pelvic floor relaxed; breath work to relax the pelvic floor.    Biofeedback sensor type Surface   rectal              [facility] Adult  PT Treatment/Exercise - 11/27/20 0001       Lumbar Exercises: Aerobic   Stationary Bike 6 minutes level 4                       PT Short Term Goals - 11/27/20 1240       PT SHORT TERM GOAL #1   Title Independent with initial HEP for diaphragmatic breathing, hip stretches    Time 4    Period Weeks    Status Achieved      PT SHORT TERM GOAL #2   Title education on scar massge to improve mobiltiy    Time 4    Period Weeks    Status Achieved               PT Long Term Goals - 11/27/20 1240       PT LONG TERM GOAL #1   Title independent with advanced HEP for core    Time 4    Period Months    Status Achieved      PT LONG TERM GOAL #2   Title able to bulge the pelvic floor while she is pushing the therapist finger out of the anal canal    Time 4    Period Months    Status Achieved      PT LONG TERM GOAL #3   Title ability to contract the lower abdominals with her core exercises    Time 4    Period Months    Status Achieved      PT LONG TERM GOAL #4   Title able to return demonstration  correct toileting in sitting with relaxtion of the pelvic floor and correct breathing    Time 4    Period Months    Status Achieved                   Plan - 11/27/20 1257     Clinical Impression Statement Patient is able to bulge her pelvic floor. She is able to relax the pelvic floor from 2 uv to 4 uv. Patient is She can quickly contract thepelvic floor to 25 uv in sidely. Contract the pelvic floor in sidely for 10 seconds around 20 uv. She is able to quickly engage and relax the pelvic floor muscles. She is able to quickly contract and relax the pelvic floor for 5  repititions. Patient is able to facilitate having a bowel movement without contracting the pelvic floor muscles. In standing she is able to contract for 10 seconds on an average of 15 uv and fully relax to 2 uv. Patient has improved control of her pelvic floor muscles.    Personal Factors and  Comorbidities Fitness;Comorbidity 2    Comorbidities Hysterectomy 12/18/2019; colostomy 12/21/2019;c-section 2009    Examination-Activity Limitations Toileting    Examination-Participation Restrictions Community Activity    Stability/Clinical Decision Making Stable/Uncomplicated    Rehab Potential Excellent    PT Treatment/Interventions ADLs/Self Care Home Management;Biofeedback;Therapeutic activities;Therapeutic exercise;Neuromuscular re-education;Patient/family education;Manual techniques;Dry needling;Scar mobilization;Spinal Manipulations    PT Next Visit Plan Discharge to HEP    PT Home Exercise Plan Access Code: [email or web address]    Consulted and Agree with Plan of Care Patient             Patient will benefit from skilled therapeutic intervention in order to improve the following deficits and impairments:  Decreased coordination, Decreased range of motion, Increased fascial restricitons, Decreased endurance, Increased muscle spasms, Decreased activity tolerance, Decreased strength, Decreased scar mobility  Visit Diagnosis: Muscle weakness (generalized)  Other lack of coordination  Other muscle spasm     Problem List Patient Active Problem List   Diagnosis Date Noted   Internal hernia 12/21/2019   Colonic obstruction ([place]) 12/21/2019   Status post surgery 12/18/2019   Status post laparoscopic hysterectomy 12/18/2019   Genetic testing 10/07/2019   Family history of breast cancer    Discomfort in chest 10/29/2016   Chronic fatigue 10/29/2016   Allergic rhinitis due to allergen 10/29/2016   Vitamin D deficiency 10/29/2016    Earlie Counts, PT 11/27/20 1:21 PM   [facility] @ [place], [place], [address] Phone:     Fax:     Name: Rosalee Tolley MRN: [id number] Date of Birth: [birth year]  PHYSICAL THERAPY DISCHARGE SUMMARY  Visits from Start of Care: 11  Current functional level related to goals /  functional outcomes: See above.    Remaining deficits: See above.    Education / Equipment: HEP   Patient agrees to discharge. Patient goals were met. Patient is being discharged due to meeting the stated rehab goals. Thank you for the referral. Earlie Counts, PT 11/27/20 1:22 PM

## 2020-12-18 ENCOUNTER — Other Ambulatory Visit: Payer: Self-pay

## 2020-12-18 ENCOUNTER — Encounter (HOSPITAL_BASED_OUTPATIENT_CLINIC_OR_DEPARTMENT_OTHER): Payer: Self-pay

## 2020-12-18 ENCOUNTER — Emergency Department (HOSPITAL_BASED_OUTPATIENT_CLINIC_OR_DEPARTMENT_OTHER)
Admission: EM | Admit: 2020-12-18 | Discharge: 2020-12-18 | Disposition: A | Discharge status: Home / Self Care | Payer: Managed Care, Other (non HMO) | Attending: Emergency Medicine | Admitting: Emergency Medicine

## 2020-12-18 ENCOUNTER — Emergency Department (HOSPITAL_BASED_OUTPATIENT_CLINIC_OR_DEPARTMENT_OTHER): Payer: Managed Care, Other (non HMO)

## 2020-12-18 DIAGNOSIS — R911 Solitary pulmonary nodule: Secondary | ICD-10-CM | POA: Insufficient documentation

## 2020-12-18 DIAGNOSIS — R079 Chest pain, unspecified: Secondary | ICD-10-CM | POA: Diagnosis present

## 2020-12-18 DIAGNOSIS — R0789 Other chest pain: Secondary | ICD-10-CM | POA: Insufficient documentation

## 2020-12-18 DIAGNOSIS — R799 Abnormal finding of blood chemistry, unspecified: Secondary | ICD-10-CM | POA: Diagnosis not present

## 2020-12-18 LAB — CBC WITH DIFFERENTIAL/PLATELET
Abs Immature Granulocytes: 0.03 10*3/uL (ref 0.00–0.07)
Basophils Absolute: 0 10*3/uL (ref 0.0–0.1)
Basophils Relative: 1 %
Eosinophils Absolute: 0.1 10*3/uL (ref 0.0–0.5)
Eosinophils Relative: 2 %
HCT: 41.8 % (ref 36.0–46.0)
Hemoglobin: 14.4 g/dL (ref 12.0–15.0)
Immature Granulocytes: 1 %
Lymphocytes Relative: 20 %
Lymphs Abs: 1.2 10*3/uL (ref 0.7–4.0)
MCH: 29.6 pg (ref 26.0–34.0)
MCHC: 34.4 g/dL (ref 30.0–36.0)
MCV: 86 fL (ref 80.0–100.0)
Monocytes Absolute: 0.4 10*3/uL (ref 0.1–1.0)
Monocytes Relative: 7 %
Neutro Abs: 4.3 10*3/uL (ref 1.7–7.7)
Neutrophils Relative %: 69 %
Platelets: 202 10*3/uL (ref 150–400)
RBC: 4.86 MIL/uL (ref 3.87–5.11)
RDW: 12.5 % (ref 11.5–15.5)
WBC: 6.1 10*3/uL (ref 4.0–10.5)
nRBC: 0 % (ref 0.0–0.2)

## 2020-12-18 LAB — BASIC METABOLIC PANEL
Anion gap: 8 (ref 5–15)
BUN: 12 mg/dL (ref 6–20)
CO2: 26 mmol/L (ref 22–32)
Calcium: 9.4 mg/dL (ref 8.9–10.3)
Chloride: 103 mmol/L (ref 98–111)
Creatinine, Ser: 0.75 mg/dL (ref 0.44–1.00)
GFR, Estimated: >60 mL/min (ref >60)
Glucose, Bld: 82 mg/dL (ref 70–99)
Potassium: 3.7 mmol/L (ref 3.5–5.1)
Sodium: 137 mmol/L (ref 135–145)

## 2020-12-18 MED ORDER — IOHEXOL 350 MG/ML SOLN
100.0000 mL | Freq: Once | INTRAVENOUS | Status: AC | PRN
  Administered 2020-12-18: 100 mL via INTRAVENOUS

## 2020-12-18 NOTE — ED Triage Notes (Signed)
Patient here POV from Home with CP and Arm Pain.  Patient states she has been having Intermittent CP for Several Weeks but this AM the Patient began having Right Arm Pain as well. Patient visited MD today who had Tests completed and is here due to Elevated D-Dimer.  NAD Noted during Triage. No SOB. No N/V. Ambulatory.

## 2020-12-18 NOTE — ED Provider Notes (Signed)
[facility] Provider Note: Georgena Spurling, MD, FACEP  CSN: [id number] MRN: [id number] ARRIVAL: 12/18/20 at [place]: [place]  Abnormal Lab   HISTORY OF PRESENT ILLNESS  12/18/20 4:32 AM Joanna Yang is a 50 y.o. female with a history of panic attacks.  The panic attacks primarily manifested themselves as left-sided chest pain.  These had been well controlled with Prozac for the past 5 years.  For the past several weeks her chest pain has recurred.  It occurs randomly and nothing seems to bring it on or relieve it.  The symptoms were the same as previous panic attacks.  Yesterday morning when she awakened she had numbness in her left upper extremity.  This numbness subsequently resolved but she contacted her PCP.  Her PCP evaluated her and did a troponin and a D-dimer.  Her troponin was was normal but her D-dimer was elevated at 700 (top end of normal 500).  She is not currently having chest pain.  She is not having shortness of breath with this.  She is not having leg pain or swelling.  She was sent here due to the elevated D-dimer.   Past Medical History:  Diagnosis Date   Anemia    Anxiety    Complication of anesthesia    Endometrial polyp 03/22/2019   Family history of breast cancer    Fibroid    Heart murmur    pt. thinks she has a benign murmur dx'd during pregnancy   Mitral regurgitation    a. Prior 2D echo in 2014 showed technically limited study, EF 60%, mild MR, trace TR.   Pneumonia    PONV (postoperative nausea and vomiting)    Right ovarian cyst 07/29/2019    Past Surgical History:  Procedure Laterality Date   ANAL RECTAL MANOMETRY N/A 04/19/2020   Procedure: ANO RECTAL MANOMETRY;  Surgeon: Leighton Ruff, MD;  Location: [facility];  Service: Endoscopy;  Laterality: N/A;   CESAREAN SECTION  2009   COLONOSCOPY     CYSTOSCOPY N/A 12/18/2019   Procedure: CYSTOSCOPY;  Surgeon: Nunzio Cobbs, MD;  Location: [facility];  Service: Gynecology;  Laterality: N/A;   ESOPHAGOGASTRODUODENOSCOPY ENDOSCOPY     IUD REMOVAL  07/29/2019   expelling IUD   LAPAROTOMY N/A 12/21/2019   Procedure: EXPLORATORY LAPAROTOMY; RESECTION OF DESCENDING AND SIGMOID COLON AND TRANSVERSE COLOSTOMY;  Surgeon: Armandina Gemma, MD;  Location: [facility];  Service: General;  Laterality: N/A;   TOTAL LAPAROSCOPIC HYSTERECTOMY WITH SALPINGECTOMY N/A 12/18/2019   Procedure: TOTAL LAPAROSCOPIC HYSTERECTOMY WITH SALPINGECTOMY and lysis of adhesions;  Surgeon: Nunzio Cobbs, MD;  Location: [facility];  Service: Gynecology;  Laterality: N/A;   TUBAL LIGATION  2009    Family History  Problem Relation Age of Onset   Hypertension Mother    Arrhythmia Mother    Depression Mother    Heart disease Mother    Heart failure Mother    Diabetes Mother    Hypertension Father    Cancer Paternal Grandmother        tumor in spine   Dementia Paternal Grandfather    Breast cancer Sister 43    Social History   Tobacco Use   Smoking status: Never   Smokeless tobacco: Never  Vaping Use   Vaping Use: Never used  Substance Use Topics   Alcohol use: No    Alcohol/week: 0.0 standard drinks   Drug use: No  Prior to Admission medications   Medication Sig Start Date End Date Taking? Authorizing Provider  acetaminophen (TYLENOL) 325 MG tablet Take 650 mg by mouth every 6 (six) hours as needed for mild pain, fever or headache.    [provider]  Cholecalciferol (VITAMIN D3) 10 MCG (400 UNIT) tablet Take 400 Units by mouth daily. Per patient taking 2000 units    [provider]  FLUoxetine (PROZAC) 40 MG capsule Take 40 mg by mouth daily.    [provider]  lactobacillus acidophilus (BACID) TABS tablet Take 1 tablet by mouth daily.    [provider]  polyethylene glycol (MIRALAX / GLYCOLAX) 17 g packet Take 17 g by mouth daily.    [provider]  simethicone (MYLICON)  80 MG chewable tablet Chew 1 tablet (80 mg total) by mouth 4 (four) times daily as needed for flatulence. 12/19/19   Nunzio Cobbs, MD  vitamin C (ASCORBIC ACID) 500 MG tablet Take 500 mg by mouth daily.    [provider]    Allergies Sulfa antibiotics and Sulfamethoxazole-trimethoprim   REVIEW OF SYSTEMS  Negative except as noted here or in the History of Present Illness.   PHYSICAL EXAMINATION  Initial Vital Signs Blood pressure 115/78, pulse 86, temperature 97.9 F (36.6 C), temperature source Oral, resp. rate 16, height 5\' 5"  (1.651 m), weight 73.9 kg, last menstrual period 08/14/2019, SpO2 100 %.  Examination General: Well-developed, well-nourished female in no acute distress; appearance consistent with age of record HENT: normocephalic; atraumatic Eyes: Normal appearance Neck: supple Heart: regular rate and rhythm Lungs: clear to auscultation bilaterally Abdomen: soft; nondistended; nontender; colostomy draining brown stool; bowel sounds present Extremities: No deformity; full range of motion; pulses normal; no edema; no calf tenderness Neurologic: Awake, alert and oriented; motor function intact in all extremities and symmetric; no facial droop Skin: Warm and dry Psychiatric: Normal mood and affect   RESULTS  Summary of this visit's results, reviewed and interpreted by myself:   EKG Interpretation  Date/Time:  Wednesday December 18 2020 00:18:18 EDT Ventricular Rate:  81 PR Interval:  126 QRS Duration: 82 QT Interval:  386 QTC Calculation: 448 R Axis:   42 Text Interpretation: Normal sinus rhythm Right atrial enlargement Borderline ECG No significant change was found Confirmed by Shanon Rosser [phone]) on 12/18/2020 12:28:57 AM       Laboratory Studies: Results for orders placed or performed during the hospital encounter of 12/18/20 (from the past 24 hour(s))  Basic metabolic panel     Status: None   Collection Time: 12/18/20 12:32 AM   Result Value Ref Range   Sodium 137 135 - 145 mmol/L   Potassium 3.7 3.5 - 5.1 mmol/L   Chloride 103 98 - 111 mmol/L   CO2 26 22 - 32 mmol/L   Glucose, Bld 82 70 - 99 mg/dL   BUN 12 6 - 20 mg/dL   Creatinine, Ser 0.75 0.44 - 1.00 mg/dL   Calcium 9.4 8.9 - 10.3 mg/dL   GFR, Estimated >60 >60 mL/min   Anion gap 8 5 - 15  CBC with Differential     Status: None   Collection Time: 12/18/20 12:32 AM  Result Value Ref Range   WBC 6.1 4.0 - 10.5 K/uL   RBC 4.86 3.87 - 5.11 MIL/uL   Hemoglobin 14.4 12.0 - 15.0 g/dL   HCT 41.8 36.0 - 46.0 %   MCV 86.0 80.0 - 100.0 fL   MCH 29.6 26.0 -  34.0 pg   MCHC 34.4 30.0 - 36.0 g/dL   RDW 12.5 11.5 - 15.5 %   Platelets 202 150 - 400 K/uL   nRBC 0.0 0.0 - 0.2 %   Neutrophils Relative % 69 %   Neutro Abs 4.3 1.7 - 7.7 K/uL   Lymphocytes Relative 20 %   Lymphs Abs 1.2 0.7 - 4.0 K/uL   Monocytes Relative 7 %   Monocytes Absolute 0.4 0.1 - 1.0 K/uL   Eosinophils Relative 2 %   Eosinophils Absolute 0.1 0.0 - 0.5 K/uL   Basophils Relative 1 %   Basophils Absolute 0.0 0.0 - 0.1 K/uL   Immature Granulocytes 1 %   Abs Immature Granulocytes 0.03 0.00 - 0.07 K/uL   Imaging Studies: CT Angio Chest PE W and/or Wo Contrast  Result Date: 12/18/2020 CLINICAL DATA:  Concern for pulmonary embolism. EXAM: CT ANGIOGRAPHY CHEST WITH CONTRAST TECHNIQUE: Multidetector CT imaging of the chest was performed using the standard protocol during bolus administration of intravenous contrast. Multiplanar CT image reconstructions and MIPs were obtained to evaluate the vascular anatomy. CONTRAST:  121mL OMNIPAQUE IOHEXOL 350 MG/ML SOLN COMPARISON:  None. FINDINGS: Cardiovascular: There is no cardiomegaly or pericardial effusion. The thoracic aorta is unremarkable. No pulmonary artery embolus identified. Mediastinum/Nodes: No hilar or mediastinal adenopathy. The esophagus is grossly unremarkable. No mediastinal fluid collection. Lungs/Pleura: There is a 4 mm left lower lobe  nodule (81/6). No focal consolidation, pleural effusion, or pneumothorax. The central airways are patent. Upper Abdomen: No acute abnormality. Musculoskeletal: No chest wall abnormality. No acute or significant osseous findings. Review of the MIP images confirms the above findings. IMPRESSION: 1. No acute intrathoracic pathology. No pulmonary artery embolus identified. 2. There is a 4 mm left lower lobe nodule. No follow-up needed if patient is low-risk. Non-contrast chest CT can be considered in 12 months if patient is high-risk. This recommendation follows the consensus statement: Guidelines for Management of Incidental Pulmonary Nodules Detected on CT Images: From the Fleischner Society 2017; Radiology 2017; 284:228-243. Electronically Signed   By: Anner Crete M.D.   On: 12/18/2020 01:47    ED COURSE and MDM  Nursing notes, initial and subsequent vitals signs, including pulse oximetry, reviewed and interpreted by myself.  Vitals:   12/18/20 0021 12/18/20 0022 12/18/20 0422  BP: 123/83  115/78  Pulse: 83  86  Resp: 16  16  Temp: 97.9 F (36.6 C)    TempSrc: Oral    SpO2: 97%  100%  Weight:  73.9 kg   Height:  5\' 5"  (1.651 m)    Medications  iohexol (OMNIPAQUE) 350 MG/ML injection 100 mL (100 mLs Intravenous Contrast Given 12/18/20 0127)   The patient CT is negative for pulmonary embolism.  She was advised of incidental finding of nodule and even though she is low risk (never a smoker) she should bring this up with her PCP.   PROCEDURES  Procedures   ED DIAGNOSES     ICD-10-CM   1. Atypical chest pain  R07.89     2. Lung nodule seen on imaging study  R91.1          Katrese Shell, Jenny Reichmann, MD 12/18/20 [phone]

## 2021-01-01 DIAGNOSIS — Z933 Colostomy status: Secondary | ICD-10-CM

## 2021-01-01 DIAGNOSIS — K5931 Toxic megacolon: Secondary | ICD-10-CM

## 2021-01-01 DIAGNOSIS — R911 Solitary pulmonary nodule: Secondary | ICD-10-CM | POA: Insufficient documentation

## 2021-01-01 HISTORY — DX: Toxic megacolon: K59.31

## 2021-02-07 ENCOUNTER — Other Ambulatory Visit: Payer: Self-pay

## 2021-02-07 ENCOUNTER — Ambulatory Visit (INDEPENDENT_AMBULATORY_CARE_PROVIDER_SITE_OTHER): Payer: Managed Care, Other (non HMO) | Admitting: Podiatry

## 2021-02-07 DIAGNOSIS — M205X1 Other deformities of toe(s) (acquired), right foot: Secondary | ICD-10-CM

## 2021-02-07 DIAGNOSIS — M79674 Pain in right toe(s): Secondary | ICD-10-CM

## 2021-02-07 DIAGNOSIS — M2041 Other hammer toe(s) (acquired), right foot: Secondary | ICD-10-CM

## 2021-02-07 NOTE — Progress Notes (Signed)
Subjective:  Patient ID: Joanna Yang, female    DOB: 12/09/1970,  MRN: [id number]  Chief Complaint  Patient presents with   Hammer Toe      R corn between toes, fourth and fifth, very painful    50 y.o. female presents with the above complaint. History confirmed with patient. Had significant GI/GU surgery since last visit. Corn between 4th and 5th toes not sure if this is the same area as befor.  Objective:  Physical Exam: warm, good capillary refill, no trophic changes or ulcerative lesions, normal DP and PT pulses and normal sensory exam. Left Foot: pain palpation about the fourth MPJ, slight hyperkeratosis Right Foot: Hammertoe deformities with interdigital corn fourth fifth toes,   No images are attached to the encounter.  Assessment:   1. Hammertoe of right foot   2. Pain in toe of right foot   3. Adductovarus rotation of toe, acquired, right       Plan:  Patient was evaluated and treated and all questions answered.  Capsulitis fourth met left with hyperkeratosis -Debrided lesion, courtesy -Discussed padding. Dispensed corn cushions and toe separators -Discussed possible surgery if this persists however still has more GI surgery scheduled and thus hopefully we can hold off for a time.  Return if symptoms worsen or fail to improve.

## 2021-03-26 ENCOUNTER — Ambulatory Visit: Payer: Managed Care, Other (non HMO) | Admitting: Obstetrics and Gynecology

## 2021-03-27 NOTE — Progress Notes (Signed)
51 y.o. G49P2012 Married [place] female here for annual exam.    Dr. Melford Aase is watching her ferritin level. It has been a little elevated.  Some hot flashes.  Manageable.   She will continue with coloscopy for megacolon.  She has a stomal hernia and will See Dr. Michael Boston this week to discuss surgical correction.   Did pelvic floor therapy.  She notes a left inguinal tenderness from time to time.   Mom passed 06/2020 from multiple organ failure after heart ablation.  PCP:  Anastasia Pall, MD    Patient's last menstrual period was 08/14/2019 (exact date).           Sexually active: Yes.    The current method of family planning is tubal ligation/Hyst.    Exercising: Yes.     Walking and upper work outs Smoker:  no  Health Maintenance: Pap:  10-15-17 Neg:Neg HR HPV, 04-20-14 Neg:Neg HR HPV  History of abnormal Pap:  no MMG:  11-02-20 MR Br.Bil/Neg/Birads1 Colonoscopy:  12-30-18 poor prep BMD:   n/a  Result  n/a TDaP:  PCP Gardasil:   no HIV: Neg in the past Hep C: Unsure Screening Labs:  PCP.   reports that she has never smoked. She has never used smokeless tobacco. She reports that she does not drink alcohol and does not use drugs.  Past Medical History:  Diagnosis Date   Anemia    Anxiety    Complication of anesthesia    Endometrial polyp 03/22/2019   Family history of breast cancer    Fibroid    Heart murmur    pt. thinks she has a benign murmur dx'd during pregnancy   Mitral regurgitation    a. Prior 2D echo in 2014 showed technically limited study, EF 60%, mild MR, trace TR.   Pneumonia    PONV (postoperative nausea and vomiting)    Right ovarian cyst 07/29/2019    Past Surgical History:  Procedure Laterality Date   ANAL RECTAL MANOMETRY N/A 04/19/2020   Procedure: ANO RECTAL MANOMETRY;  Surgeon: Leighton Ruff, MD;  Location: [facility];  Service: Endoscopy;  Laterality: N/A;   CESAREAN SECTION  2009   COLONOSCOPY     CYSTOSCOPY N/A 12/18/2019    Procedure: CYSTOSCOPY;  Surgeon: Nunzio Cobbs, MD;  Location: [facility];  Service: Gynecology;  Laterality: N/A;   ESOPHAGOGASTRODUODENOSCOPY ENDOSCOPY     IUD REMOVAL  07/29/2019   expelling IUD   LAPAROTOMY N/A 12/21/2019   Procedure: EXPLORATORY LAPAROTOMY; RESECTION OF DESCENDING AND SIGMOID COLON AND TRANSVERSE COLOSTOMY;  Surgeon: Armandina Gemma, MD;  Location: [facility];  Service: General;  Laterality: N/A;   TOTAL LAPAROSCOPIC HYSTERECTOMY WITH SALPINGECTOMY N/A 12/18/2019   Procedure: TOTAL LAPAROSCOPIC HYSTERECTOMY WITH SALPINGECTOMY and lysis of adhesions;  Surgeon: Nunzio Cobbs, MD;  Location[facility];  Service: Gynecology;  Laterality: N/A;   TUBAL LIGATION  2009    Current Outpatient Medications  Medication Sig Dispense Refill   acetaminophen (TYLENOL) 325 MG tablet Take 650 mg by mouth every 6 (six) hours as needed for mild pain, fever or headache.     clonazePAM (KLONOPIN) 0.5 MG tablet      FLUoxetine (PROZAC) 20 MG capsule Take by mouth.     Melatonin 5 MG CAPS Take 1 capsule by mouth at bedtime.     polyethylene glycol (MIRALAX / GLYCOLAX) 17 g packet Take 17 g by mouth daily.     rosuvastatin (CRESTOR) 10  MG tablet      simethicone (MYLICON) 80 MG chewable tablet Chew 1 tablet (80 mg total) by mouth 4 (four) times daily as needed for flatulence. 30 tablet 0   ZYRTEC ALLERGY 10 MG tablet SMARTSIG:1 Tablet(s) By Mouth     No current facility-administered medications for this visit.    Family History  Problem Relation Age of Onset   Hypertension Mother    Arrhythmia Mother    Depression Mother    Heart disease Mother    Heart failure Mother    Diabetes Mother    Hypertension Father    Breast cancer Sister 67   Cancer Paternal Grandmother        tumor in spine   Dementia Paternal Grandfather     Review of Systems  All other systems reviewed and are negative.  Exam:   BP 108/60    Pulse 88    Ht 5'  4.5" (1.638 m)    Wt 166 lb (75.3 kg)    LMP 08/14/2019 (Exact Date)    SpO2 97%    BMI 28.05 kg/m     General appearance: alert, cooperative and appears stated age Head: normocephalic, without obvious abnormality, atraumatic Neck: no adenopathy, supple, symmetrical, trachea midline and thyroid normal to inspection and palpation Lungs: clear to auscultation bilaterally Breasts: normal appearance, no masses or tenderness, No nipple retraction or dimpling, No nipple discharge or bleeding, No axillary adenopathy Heart: regular rate and rhythm Abdomen: vertical midline incision and Pfannensteil incision.  Colostomy bag and surrounding hernia noted, nontender.  Abdomen is soft, non-tender. Extremities: extremities normal, atraumatic, no cyanosis or edema Skin: skin color, texture, turgor normal. No rashes or lesions Lymph nodes: cervical, supraclavicular, and axillary nodes normal. Neurologic: grossly normal  Pelvic: External genitalia:  no lesions              No abnormal inguinal nodes palpated.              Urethra:  normal appearing urethra with no masses, tenderness or lesions              Bartholins and Skenes: normal                 Vagina: normal appearing vagina with normal color and discharge, no lesions              Cervix: absent              Pap taken: no Bimanual Exam:  Uterus:  absent              Adnexa: no mass, fullness, tenderness              Rectal exam:  Deferred  Chaperone was present for exam:  Estill Bamberg, CMA  Assessment:   Well woman visit with gynecologic exam. Status post laparoscopic hysterectomy with bilateral salpingectomy, cystoscopy.  Megacolon. Status post partial large bowel resection with colostomy.  FH breast cancer in sister.  Personal increased lifetime risk of breast cancer - 27.9%. Left inguinal tenderness.  Plan: Mammogram screening and breast MRI yearly.  She will decide if she wants to do this around the same time each year or if she wants to  alternate one every 6 months.  Self breast awareness reviewed. Pap and HR HPV as above. Guidelines for Calcium, Vitamin D, regular exercise program including cardiovascular and weight bearing exercise. Has upcoming appointment with Dr. Johney Maine.  I prompted her to discuss her inguinal tenderness with him to determine  if she may be having in inguinal hernia develop.   Follow up annually and prn.   After visit summary provided.

## 2021-03-31 ENCOUNTER — Other Ambulatory Visit: Payer: Self-pay

## 2021-03-31 ENCOUNTER — Ambulatory Visit (INDEPENDENT_AMBULATORY_CARE_PROVIDER_SITE_OTHER): Payer: Managed Care, Other (non HMO) | Admitting: Obstetrics and Gynecology

## 2021-03-31 ENCOUNTER — Encounter: Payer: Self-pay | Admitting: Obstetrics and Gynecology

## 2021-03-31 VITALS — BP 108/60 | HR 88 | Ht 64.5 in | Wt 166.0 lb

## 2021-03-31 DIAGNOSIS — Z01419 Encounter for gynecological examination (general) (routine) without abnormal findings: Secondary | ICD-10-CM

## 2021-03-31 NOTE — Patient Instructions (Signed)

## 2021-04-02 DIAGNOSIS — K435 Parastomal hernia without obstruction or  gangrene: Secondary | ICD-10-CM | POA: Diagnosis present

## 2021-04-02 DIAGNOSIS — K5641 Fecal impaction: Secondary | ICD-10-CM | POA: Insufficient documentation

## 2021-04-02 DIAGNOSIS — K5902 Outlet dysfunction constipation: Secondary | ICD-10-CM | POA: Insufficient documentation

## 2021-04-29 NOTE — Progress Notes (Unsigned)
Cardiology Office Note    Date:  04/29/2021   ID:  Joanna Yang, DOB February 21, 1971, MRN [id number]   PCP:  Chesley Noon, MD   [place]  Cardiologist:  None *** Advanced Practice Provider:  No care team member to display Electrophysiologist:  None   [id number]}   No chief complaint on file.   History of Present Illness:  Joanna Yang is a 51 y.o. female with long history of panic attacks and atypical chest pain. ETT 01/2016 inconclusive with upsloping 1 mm ST depression resolved 1 min into recovery, stress echo 02/2016 sinus tach with 2-3 mm horizontal to upsloping ST depression peak stress, normal LVEF recovery and peak stress.  She went to [facility] for second opinion: "She is a lovely woman who is very anxious about her health, to the point that it is diminishing her quality of life. For example, she has had multiple mammograms this year b/c she thought she felt lumps. Her symptoms of sharp chest pains only began a couple weeks ago after her initial abnormal ETT, after which she became worried about a heart attack. She essentially has no risk factors for CAD and never had CP with exertion in the past. Her current symptoms are very atypical and unlikely to be cardiac in origin. It sounds as though she likely had a panic attack on 03/12/16 when she called 911. I reviewed her prior EKGs and the strips from that incident -- there were no worrisome abnormalities on that strip. Her home cardiologist has told her that she does not have ischemia and does not need further testing.  We had offered coronary CTA in January 2018 but she declined due to concerns about radiation.  She had a hysterectomy in 10/21.  She then needed repeat surgery a few days later for megacolon. Now with ostomy bag. Since she is from [place], there was a concern or Chagas disease.    Sister with breast cancer.  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   Last saw Dr.  Irish Lack 03/19/20 and atypical chest pain and mild DOE. Echo ordered for HOCM screening base on her sister.Echo 03/2020 was normal.  In ED with panic attack and chest pain-CT chest 12/18/20 no PE, 4 mm left lower lobe nodule.    Past Medical History:  Diagnosis Date   Anemia    Anxiety    Complication of anesthesia    Endometrial polyp 03/22/2019   Family history of breast cancer    Fibroid    Heart murmur    pt. thinks she has a benign murmur dx'd during pregnancy   Mitral regurgitation    a. Prior 2D echo in 2014 showed technically limited study, EF 60%, mild MR, trace TR.   Pneumonia    PONV (postoperative nausea and vomiting)    Right ovarian cyst 07/29/2019    Past Surgical History:  Procedure Laterality Date   ANAL RECTAL MANOMETRY N/A 04/19/2020   Procedure: ANO RECTAL MANOMETRY;  Surgeon: Leighton Ruff, MD;  Location: [facility];  Service: Endoscopy;  Laterality: N/A;   CESAREAN SECTION  2009   COLONOSCOPY     CYSTOSCOPY N/A 12/18/2019   Procedure: CYSTOSCOPY;  Surgeon: Nunzio Cobbs, MD;  Location: [facility];  Service: Gynecology;  Laterality: N/A;   ESOPHAGOGASTRODUODENOSCOPY ENDOSCOPY     IUD REMOVAL  07/29/2019   expelling IUD   LAPAROTOMY N/A 12/21/2019   Procedure: EXPLORATORY LAPAROTOMY; RESECTION OF DESCENDING AND SIGMOID  COLON AND TRANSVERSE COLOSTOMY;  Surgeon: Armandina Gemma, MD;  Location: [facility];  Service: General;  Laterality: N/A;   TOTAL LAPAROSCOPIC HYSTERECTOMY WITH SALPINGECTOMY N/A 12/18/2019   Procedure: TOTAL LAPAROSCOPIC HYSTERECTOMY WITH SALPINGECTOMY and lysis of adhesions;  Surgeon: Nunzio Cobbs, MD;  Location: [facility];  Service: Gynecology;  Laterality: N/A;   TUBAL LIGATION  2009    Current Medications: No outpatient medications have been marked as taking for the 05/13/21 encounter (Appointment) with Imogene Burn, PA-C.     Allergies:   Sulfamethoxazole-trimethoprim   Social  History   Socioeconomic History   Marital status: Married    Spouse name: Not on file   Number of children: Not on file   Years of education: Not on file   Highest education level: Not on file  Occupational History   Occupation: housewife  Tobacco Use   Smoking status: Never   Smokeless tobacco: Never  Vaping Use   Vaping Use: Never used  Substance and Sexual Activity   Alcohol use: No    Alcohol/week: 0.0 standard drinks   Drug use: No   Sexual activity: Yes    Partners: Male    Birth control/protection: Surgical    Comment: Tubal/Hyst  Other Topics Concern   Not on file  Social History Narrative   Lives with husband and children.    Housewife.    Social Determinants of Health   Financial Resource Strain: Not on file  Food Insecurity: Not on file  Transportation Needs: Not on file  Physical Activity: Not on file  Stress: Not on file  Social Connections: Not on file     Family History:  The patient's ***family history includes Arrhythmia in her mother; Breast cancer (age of onset: 71) in her sister; Cancer in her paternal grandmother; Dementia in her paternal grandfather; Depression in her mother; Diabetes in her mother; Heart disease in her mother; Heart failure in her mother; Hypertension in her father and mother.   ROS:   Please see the history of present illness.    ROS All other systems reviewed and are negative.   PHYSICAL EXAM:   VS:  LMP 08/14/2019 (Exact Date)   Physical Exam  GEN: Well nourished, well developed, in no acute distress  HEENT: normal  Neck: no JVD, carotid bruits, or masses Cardiac:RRR; no murmurs, rubs, or gallops  Respiratory:  clear to auscultation bilaterally, normal work of breathing GI: soft, nontender, nondistended, + BS Ext: without cyanosis, clubbing, or edema, Good distal pulses bilaterally MS: no deformity or atrophy  Skin: warm and dry, no rash Neuro:  Alert and Oriented x 3, Strength and sensation are intact Psych:  euthymic mood, full affect  Wt Readings from Last 3 Encounters:  03/31/21 166 lb (75.3 kg)  12/18/20 163 lb (73.9 kg)  03/20/20 160 lb (72.6 kg)      Studies/Labs Reviewed:   EKG:  EKG is*** ordered today.  The ekg ordered today demonstrates ***  Recent Labs: 12/18/2020: BUN 12; Creatinine, Ser 0.75; Hemoglobin 14.4; Platelets 202; Potassium 3.7; Sodium 137   Lipid Panel    Component Value Date/Time   CHOL 186 01/10/2016 1701   TRIG 116 01/10/2016 1701   HDL 55 01/10/2016 1701   CHOLHDL 3.4 01/10/2016 1701   VLDL 23 01/10/2016 1701   LDLCALC 108 (H) 01/10/2016 1701    Additional studies/ records that were reviewed today include:  CT chest 12/18/20 IMPRESSION: 1. No acute intrathoracic pathology. No pulmonary artery  embolus identified. 2. There is a 4 mm left lower lobe nodule. No follow-up needed if patient is low-risk. Non-contrast chest CT can be considered in 12 months if patient is high-risk. This recommendation follows the consensus statement: Guidelines for Management of Incidental Pulmonary Nodules Detected on CT Images: From the Fleischner Society 2017; Radiology 2017; 284:228-243.     Electronically Signed   By: Anner Crete M.D.   On: 12/18/2020 01:47   Echo 03/2020 IMPRESSIONS     1. Left ventricular ejection fraction, by estimation, is 65 to 70%. The  left ventricle has normal function. The left ventricle has no regional  wall motion abnormalities. Left ventricular diastolic parameters are  indeterminate.   2. Right ventricular systolic function is normal. The right ventricular  size is normal.   3. The mitral valve is normal in structure. No evidence of mitral valve  regurgitation.   4. The aortic valve is normal in structure. Aortic valve regurgitation is  not visualized.   FINDINGS   Left Ventricle: Left ventricular ejection fraction, by estimation, is 65  to 70%. The left ventricle has normal function. The left ventricle has no   regional wall motion abnormalities. The left ventricular internal cavity  size was normal in size. There is   no left ventricular hypertrophy. Left ventricular diastolic parameters  are indeterminate.   Right Ventricle: The right ventricular size is normal. Right vetricular  wall thickness was not assessed. Right ventricular systolic function is  normal.   Left Atrium: Left atrial size was normal in size.   Right Atrium: Right atrial size was normal in size.   Pericardium: There is no evidence of pericardial effusion.   Mitral Valve: The mitral valve is normal in structure. No evidence of  mitral valve regurgitation.   Tricuspid Valve: The tricuspid valve is grossly normal. Tricuspid valve  regurgitation is trivial.   Aortic Valve: The aortic valve is normal in structure. Aortic valve  regurgitation is not visualized.   Pulmonic Valve: The pulmonic valve was not well visualized. Pulmonic valve  regurgitation is not visualized.   Aorta: The aortic root and ascending aorta are structurally normal, with  no evidence of dilitation.   IAS/Shunts: No atrial level shunt detected by color flow Doppler.   Risk Assessment/Calculations:   {Does this patient have ATRIAL FIBRILLATION?:[phone]}     ASSESSMENT:    No diagnosis found.   PLAN:  In order of problems listed above:  Chest pain atypical/panic attacks  Sister with [facility] 03/2020 normal  Shared Decision Making/Informed Consent   {Are you ordering a CV Procedure (e.g. stress test, cath, DCCV, TEE, etc)?   Press F2        :[id number]}    Medication Adjustments/Labs and Tests Ordered: Current medicines are reviewed at length with the patient today.  Concerns regarding medicines are outlined above.  Medication changes, Labs and Tests ordered today are listed in the Patient Instructions below. There are no Patient Instructions on file for this visit.   Sumner Boast, PA-C  04/29/2021 9:37 AM    [facility] [address], [place], [place]  [address] Phone: [phone]; Fax: [phone]

## 2021-04-30 ENCOUNTER — Ambulatory Visit: Payer: Self-pay | Admitting: Surgery

## 2021-05-13 ENCOUNTER — Ambulatory Visit: Payer: Managed Care, Other (non HMO) | Admitting: Physician Assistant

## 2021-05-26 NOTE — Progress Notes (Signed)
? ?Cardiology Office Note   ? ?Date:  06/04/2021  ? ?ID:  Joanna Yang, DOB 07-25-1970, MRN [id number] ? ? ?PCP:  Chesley Noon, MD ?  ?[place]  ?Cardiologist:  Larae Grooms, MD   ?Advanced Practice Provider:  No care team member to display ?Electrophysiologist:  None  ? ?[id number]}  ? ?Chief Complaint  ?Patient presents with  ?? Chest Pain  ? ? ?History of Present Illness:  ?Joanna Yang is a 51 y.o. female ith long history of panic attacks and atypical chest pain. ETT 01/2016 inconclusive with upsloping 1 mm ST depression resolved 1 min into recovery, stress echo 02/2016 sinus tach with 2-3 mm horizontal to upsloping ST depression peak stress, normal LVEF recovery and peak stress. ? ?She went to [facility] for second opinion: "She is a lovely woman who is very anxious about her health, to the point that it is diminishing her quality of life. For example, she has had multiple mammograms this year b/c she thought she felt lumps. Her symptoms of sharp chest pains only began a couple weeks ago after her initial abnormal ETT, after which she became worried about a heart attack. She essentially has no risk factors for CAD and never had CP with exertion in the past. Her current symptoms are very atypical and unlikely to be cardiac in origin. It sounds as though she likely had a panic attack on 03/12/16 when she called 911. I reviewed her prior EKGs and the strips from that incident -- there were no worrisome abnormalities on that strip. Her home cardiologist has told her that she does not have ischemia and does not need further testing. ? ?We had offered coronary CTA in January 2018 but she declined due to concerns about radiation. ? She had a hysterectomy in 10/21.  She then needed repeat surgery a few days later for megacolon. Now with ostomy bag. Since she is from [place], there was a concern or Chagas disease.  ?  ?Sister with breast cancer.  She had a scan that showed nonobstructive  hypertrophic cardiomyopathy.  ? ?Last saw Dr. Irish Lack 03/19/20 and atypical chest pain and mild DOE. Echo ordered for HOCM screening base on her sister.Echo 03/2020 was normal. ? ?In ED with panic attack and chest pain-CT chest 12/18/20 no PE, 4 mm left lower lobe nodule. ? ?She comes in today wanting to talk about ED visit 12/18/20 and upcoming surgery. Patient says D dimer was high at urgent care before she went to ED which I can't see. She is wondering if we should repeat her d dimer before surgery. On rosuvastatin for cholesterol by PCP. Walks some but not consistent exercise. Joined [employer] and doing light weights. No regular chest pain other than anxiety. No exertional chest pain.  Asking to have Coronary CTA that was offered in 2018. LDL 99 03/27/21 down from 146. Patient very anxious. ?  ? ? ? ?Past Medical History:  ?Diagnosis Date  ?? Anemia   ?? Anxiety   ?? Complication of anesthesia   ?? Endometrial polyp 03/22/2019  ?? Family history of breast cancer   ?? Fibroid   ?? Heart murmur   ? pt. thinks she has a benign murmur dx'd during pregnancy  ?? Mitral regurgitation   ? a. Prior 2D echo in 2014 showed technically limited study, EF 60%, mild MR, trace TR.  ?? Pneumonia   ?? PONV (postoperative nausea and vomiting)   ?? Right ovarian cyst 07/29/2019  ? ? ?  Past Surgical History:  ?Procedure Laterality Date  ?? ANAL RECTAL MANOMETRY N/A 04/19/2020  ? Procedure: ANO RECTAL MANOMETRY;  Surgeon: Leighton Ruff, MD;  Location: [facility];  Service: Endoscopy;  Laterality: N/A;  ?? CESAREAN SECTION  2009  ?? COLONOSCOPY    ?? CYSTOSCOPY N/A 12/18/2019  ? Procedure: CYSTOSCOPY;  Surgeon: Nunzio Cobbs, MD;  [facility];  Service: Gynecology;  Laterality: N/A;  ?? ESOPHAGOGASTRODUODENOSCOPY ENDOSCOPY    ?? IUD REMOVAL  07/29/2019  ? expelling IUD  ?? LAPAROTOMY N/A 12/21/2019  ? Procedure: EXPLORATORY LAPAROTOMY; RESECTION OF DESCENDING AND SIGMOID COLON AND  TRANSVERSE COLOSTOMY;  Surgeon: Armandina Gemma, MD;  Location: [facility];  Service: General;  Laterality: N/A;  ?? TOTAL LAPAROSCOPIC HYSTERECTOMY WITH SALPINGECTOMY N/A 12/18/2019  ? Procedure: TOTAL LAPAROSCOPIC HYSTERECTOMY WITH SALPINGECTOMY and lysis of adhesions;  Surgeon: Nunzio Cobbs, MD;  Location: [facility];  Service: Gynecology;  Laterality: N/A;  ?? TUBAL LIGATION  2009  ? ? ?Current Medications: ?Current Meds  ?Medication Sig  ?? acetaminophen (TYLENOL) 325 MG tablet Take 650 mg by mouth every 6 (six) hours as needed for mild pain, fever or headache.  ?? azelastine (ASTELIN) 0.1 % nasal spray Place 1 spray into both nostrils 2 (two) times daily.  ?? clonazePAM (KLONOPIN) 0.5 MG tablet   ?? doxycycline (ADOXA) 100 MG tablet Take 100 mg by mouth 2 (two) times daily.  ?? fexofenadine (ALLEGRA) 180 MG tablet Take 180 mg by mouth daily.  ?? FLUoxetine (PROZAC) 20 MG capsule Take by mouth.  ?? meclizine (ANTIVERT) 25 MG tablet Take by mouth.  ?? Melatonin 5 MG CAPS Take 1 capsule by mouth at bedtime.  ?? metoprolol tartrate (LOPRESSOR) 50 MG tablet TAKE 1 TABLET BY MOUTH 2 HOURS PRIOR TO YOUR CT  ?? polyethylene glycol (MIRALAX / GLYCOLAX) 17 g packet Take 17 g by mouth daily.  ?? rosuvastatin (CRESTOR) 10 MG tablet   ?? simethicone (MYLICON) 80 MG chewable tablet Chew 1 tablet (80 mg total) by mouth 4 (four) times daily as needed for flatulence.  ?  ? ?Allergies:   Sulfamethoxazole-trimethoprim and Sulfur  ? ?Social History  ? ?Socioeconomic History  ?? Marital status: Married  ?  Spouse name: Not on file  ?? Number of children: Not on file  ?? Years of education: Not on file  ?? Highest education level: Not on file  ?Occupational History  ?? Occupation: housewife  ?Tobacco Use  ?? Smoking status: Never  ?? Smokeless tobacco: Never  ?Vaping Use  ?? Vaping Use: Never used  ?Substance and Sexual Activity  ?? Alcohol use: No  ?  Alcohol/week: 0.0 standard drinks  ?? Drug use: No  ??  Sexual activity: Yes  ?  Partners: Male  ?  Birth control/protection: Surgical  ?  Comment: Tubal/Hyst  ?Other Topics Concern  ?? Not on file  ?Social History Narrative  ? Lives with husband and children.   ? Housewife.   ? ?Social Determinants of Health  ? ?Financial Resource Strain: Not on file  ?Food Insecurity: Not on file  ?Transportation Needs: Not on file  ?Physical Activity: Not on file  ?Stress: Not on file  ?Social Connections: Not on file  ?  ? ?Family History:  The patient's  family history includes Arrhythmia in her mother; Breast cancer (age of onset: 3) in her sister; Cancer in her paternal grandmother; Dementia in her paternal grandfather; Depression in her mother; Diabetes in  her mother; Heart disease in her mother; Heart failure in her mother; Hypertension in her father and mother.  ? ?ROS:   ?Please see the history of present illness.    ?ROS All other systems reviewed and are negative. ? ? ?PHYSICAL EXAM:   ?VS:  BP 98/66 (BP Location: Left Arm, Patient Position: Sitting, Cuff Size: Normal)   Pulse 98   Ht '5\' 6"'$  (1.676 m)   Wt 167 lb 12.8 oz (76.1 kg)   LMP 08/14/2019 (Exact Date)   SpO2 99%   BMI 27.08 kg/m?   ?Physical Exam  ?GEN: Well nourished, well developed, in no acute distress  ?Neck: no JVD, carotid bruits, or masses ?Cardiac:RRR; no murmurs, rubs, or gallops  ?Respiratory:  clear to auscultation bilaterally, normal work of breathing ?GI: soft, nontender, nondistended, + BS ?Ext: without cyanosis, clubbing, or edema, Good distal pulses bilaterally ?Neuro:  Alert and Oriented x 3 ?Psych: anxious, full affect ? ?Wt Readings from Last 3 Encounters:  ?06/04/21 167 lb 12.8 oz (76.1 kg)  ?03/31/21 166 lb (75.3 kg)  ?12/18/20 163 lb (73.9 kg)  ?  ? ? ?Studies/Labs Reviewed:  ? ?EKG:  EKG is not ordered today.    ? ?Recent Labs: ?12/18/2020: BUN 12; Creatinine, Ser 0.75; Hemoglobin 14.4; Platelets 202; Potassium 3.7; Sodium 137  ? ?Lipid Panel ?   ?Component Value Date/Time  ? CHOL 186  01/10/2016 1701  ? TRIG 116 01/10/2016 1701  ? HDL 55 01/10/2016 1701  ? CHOLHDL 3.4 01/10/2016 1701  ? VLDL 23 01/10/2016 1701  ? LDLCALC 108 (H) 01/10/2016 1701  ? ? ?Additional studies/ records that were reviewed today i

## 2021-06-04 ENCOUNTER — Encounter: Payer: Self-pay | Admitting: Physician Assistant

## 2021-06-04 ENCOUNTER — Ambulatory Visit (INDEPENDENT_AMBULATORY_CARE_PROVIDER_SITE_OTHER): Payer: Managed Care, Other (non HMO) | Admitting: Physician Assistant

## 2021-06-04 VITALS — BP 98/66 | HR 98 | Ht 66.0 in | Wt 167.8 lb

## 2021-06-04 DIAGNOSIS — R072 Precordial pain: Secondary | ICD-10-CM

## 2021-06-04 DIAGNOSIS — E785 Hyperlipidemia, unspecified: Secondary | ICD-10-CM | POA: Diagnosis not present

## 2021-06-04 MED ORDER — METOPROLOL TARTRATE 50 MG PO TABS: ORAL_TABLET | ORAL | 0 refills | Status: DC

## 2021-06-04 NOTE — Patient Instructions (Addendum)
Medication Instructions:  ?Your physician recommends that you continue on your current medications as directed. Please refer to the Current Medication list given to you today. ? ?*If you need a refill on your cardiac medications before your next appointment, please call your pharmacy* ? ? ?Lab Work: ?You will need a BMET prior to your Cardiac Ct.  Once you have it scheduled, just come to the office anytime after 7:15 a.m to have this done 3-4 days before your ct ? ?If you have labs (blood work) drawn today and your tests are completely normal, you will receive your results only by: ?MyChart Message (if you have MyChart) OR ?A paper copy in the mail ?If you have any lab test that is abnormal or we need to change your treatment, we will call you to review the results. ? ? ?Testing/Procedures: ?Your physician has requested that you have cardiac CT. Cardiac computed tomography (CT) is a painless test that uses an x-ray machine to take clear, detailed pictures of your heart. For further information please visit [email or web address]. Please follow instruction sheet BELOW: ? ? ? ?Your cardiac CT will be scheduled at one of the below locations:  ? ?[facility] ?[address] ?[place], [place] [address] ?[phone] ? ? ? ?Please arrive at the [facility] (Entrance C2) of [facility] 30 minutes prior to test start time. ?You can use the FREE valet parking offered at entrance C (encouraged to control the heart rate for the test)  ?Proceed to the [facility] Radiology Department (first floor) to check-in and test prep. ? ?All radiology patients and guests should use entrance C2 at [facility], accessed from [facility], even though the hospital's physical address listed is [address] ? ? ? ?Please follow these instructions carefully (unless otherwise directed): ? ? ? ?On the Night Before the Test: ?Be sure to Drink plenty of water. ?Do not consume any  caffeinated/decaffeinated beverages or chocolate 12 hours prior to your test. ?Do not take any antihistamines 12 hours prior to your test. ? ? ?On the Day of the Test: ?Drink plenty of water until 1 hour prior to the test. ?Do not eat any food 4 hours prior to the test. ?You may take your regular medications prior to the test.  ?Take metoprolol (Lopressor) 50 mg two hours prior to test. This has been sent to your pharmacy ?FEMALES- please wear underwire-free bra if available, avoid dresses & tight clothing ? ?    ?After the Test: ?Drink plenty of water. ?After receiving IV contrast, you may experience a mild flushed feeling. This is normal. ?On occasion, you may experience a mild rash up to 24 hours after the test. This is not dangerous. If this occurs, you can take Benadryl 25 mg and increase your fluid intake. ?If you experience trouble breathing, this can be serious. If it is severe call 911 IMMEDIATELY. If it is mild, please call our office. ?If you take any of these medications: Glipizide/Metformin, Avandament, Glucavance, please do not take 48 hours after completing test unless otherwise instructed. ? ?We will call to schedule your test 2-4 weeks out understanding that some insurance companies will need an authorization prior to the service being performed.  ? ?For non-scheduling related questions, please contact the cardiac imaging nurse navigator should you have any questions/concerns: ?Marchia Bond, Cardiac Imaging Nurse Navigator ?Gordy Clement, Cardiac Imaging Nurse Navigator ?[facility] ?Direct Office Dial: [phone]  ? ?  For scheduling needs, including cancellations and rescheduling, please call [place], [phone]. ? ? ? ? ? ?Follow-Up: ?At [facility], you and your health needs are our priority.  As part of our continuing mission to provide you with exceptional heart care, we have created designated Provider Care Teams.  These Care Teams include your primary  Cardiologist (physician) and Advanced Practice Providers (APPs -  Physician Assistants and Nurse Practitioners) who all work together to provide you with the care you need, when you need it. ? ?We recommend signing up for the patient portal called "MyChart".  Sign up information is provided on this After Visit Summary.  MyChart is used to connect with patients for Virtual Visits (Telemedicine).  Patients are able to view lab/test results, encounter notes, upcoming appointments, etc.  Non-urgent messages can be sent to your provider as well.   ?To learn more about what you can do with MyChart, go to [email or web address].   ? ?Your next appointment:   ?12 month(s) ? ?The format for your next appointment:   ?In Person ? ?Provider:   ?Larae Grooms, MD   ? ? ?Other Instructions ? ?Important Information About Sugar ? ? ? ? ?  ?

## 2021-07-28 NOTE — Patient Instructions (Addendum)
DUE TO COVID-19 ONLY TWO VISITORS  (aged 50 and older)  IS ALLOWED TO COME WITH YOU AND STAY IN THE WAITING ROOM ONLY DURING PRE OP AND PROCEDURE.   **NO VISITORS ARE ALLOWED IN THE SHORT STAY AREA OR RECOVERY ROOM!!**  IF YOU WILL BE ADMITTED INTO THE HOSPITAL YOU ARE ALLOWED ONLY FOUR SUPPORT PEOPLE DURING VISITATION HOURS ONLY (7 AM -8PM)   The support person(s) must pass our screening, gel in and out Visitors GUEST BADGE MUST BE WORN VISIBLY  One adult visitor may remain with you overnight and MUST be in the room by 8 P.M.   You are not required to [employer] often Do NOT share personal items Notify your provider if you are in close contact with someone who has COVID or you develop fever 100.4 or greater, new onset of sneezing, cough, sore throat, shortness of breath or body aches.        Your procedure is scheduled on:  08-13-21   Report to [facility] Main Entrance    Report to admitting at 6:15 AM   Call this number if you have problems the morning of surgery [phone]   Do not eat food :After Midnight.   After Midnight you may have the following liquids until 5:30 AM DAY OF SURGERY  Clear Liquid Diet Water Black Coffee (sugar ok, NO MILK/CREAM OR CREAMERS)  Tea (sugar ok, NO MILK/CREAM OR CREAMERS) regular and decaf                             Plain Jell-O (NO RED)                                           Fruit ices (not with fruit pulp, NO RED)                                     Popsicles (NO RED)                                                                  Juice: apple, WHITE grape, WHITE cranberry Sports drinks like Gatorade (NO RED) Clear broth(vegetable,chicken,beef)  Drink 2 Pre-Surgery Clear Ensure the evening before surgery (complete by 10 PM)                   The day of surgery:  Drink ONE (1) Pre-Surgery Clear Ensure at 5:30 AM the morning of surgery. Drink in one sitting. Do not sip.  This drink was given to you during your  hospital  pre-op appointment visit. Nothing else to drink after completing the Pre-Surgery Clear Ensure          If you have questions, please contact your surgeon's office.   FOLLOW ANY ADDITIONAL PRE OP INSTRUCTIONS YOU RECEIVED FROM YOUR SURGEON'S OFFICE!!!     Oral Hygiene is also important to reduce your risk of infection.  Remember - BRUSH YOUR TEETH THE MORNING OF SURGERY WITH YOUR REGULAR TOOTHPASTE   Do NOT smoke after Midnight  Take these medicines the morning of surgery with A SIP OF WATER: Clonazepam, Allegra, Fluoxetine.   Okay to take Tylenol and use nasal spray                              You may not have any metal on your body including hair pins, jewelry, and body piercing             Do not wear make-up, lotions, powders, perfumes or deodorant  Do not wear nail polish including gel and S&S, artificial/acrylic nails, or any other type of covering on natural nails including finger and toenails. If you have artificial nails, gel coating, etc. that needs to be removed by a nail salon please have this removed prior to surgery or surgery may need to be canceled/ delayed if the surgeon/ anesthesia feels like they are unable to be safely monitored.   Do not shave  48 hours prior to surgery.    Do not bring valuables to the hospital. [place].   Contacts, dentures or bridgework may not be worn into surgery.   Bring small overnight bag day of surgery.   Please read over the following fact sheets you were given: IF YOU HAVE QUESTIONS ABOUT YOUR PRE-OP INSTRUCTIONS PLEASE CALL [place] - Preparing for Surgery Before surgery, you can play an important role.  Because skin is not sterile, your skin needs to be as free of germs as possible.  You can reduce the number of germs on your skin by washing with CHG (chlorahexidine gluconate) soap before surgery.  CHG is an antiseptic cleaner  which kills germs and bonds with the skin to continue killing germs even after washing. Please DO NOT use if you have an allergy to CHG or antibacterial soaps.  If your skin becomes reddened/irritated stop using the CHG and inform your nurse when you arrive at Short Stay. Do not shave (including legs and underarms) for at least 48 hours prior to the first CHG shower.  You may shave your face/neck.  Please follow these instructions carefully:  1.  Shower with CHG Soap the night before surgery and the  morning of surgery.  2.  If you choose to wash your hair, wash your hair first as usual with your normal  shampoo.  3.  After you shampoo, rinse your hair and body thoroughly to remove the shampoo.                             4.  Use CHG as you would any other liquid soap.  You can apply chg directly to the skin and wash.  Gently with a scrungie or clean washcloth.  5.  Apply the CHG Soap to your body ONLY FROM THE NECK DOWN.   Do   not use on face/ open                           Wound or open sores. Avoid contact with eyes, ears mouth and   genitals (private parts).                       Wash face,  Genitals (private parts) with your normal soap.             6.  Wash thoroughly, paying special attention to the area where your    surgery  will be performed.  7.  Thoroughly rinse your body with warm water from the neck down.  8.  DO NOT shower/wash with your normal soap after using and rinsing off the CHG Soap.                9.  Pat yourself dry with a clean towel.            10.  Wear clean pajamas.            11.  Place clean sheets on your bed the night of your first shower and do not  sleep with pets. Day of Surgery : Do not apply any lotions/deodorants the morning of surgery.  Please wear clean clothes to the hospital/surgery center.  FAILURE TO FOLLOW THESE INSTRUCTIONS MAY RESULT IN THE CANCELLATION OF YOUR SURGERY  PATIENT SIGNATURE_________________________________  NURSE  SIGNATURE__________________________________  ________________________________________________________________________

## 2021-07-28 NOTE — Progress Notes (Addendum)
COVID Vaccine Completed: Yes  Date of COVID positive in last 90 days:  No  PCP - Anastasia Pall, MD Cardiologist - Larae Grooms, MD  Chest x-ray - CT chest 12-18-20 Epic EKG - 12-18-20 Epic Stress Test - greater than 2 years Epic ECHO - 04-11-20 Epic Cardiac Cath - N/A Pacemaker/ICD device last checked: Spinal Cord Stimulator:  Bowel Prep - N/A  Sleep Study - Yes, +sleep apnea CPAP -No, uses a mouth guard  Fasting Blood Sugar - N/A Checks Blood Sugar _____ times a day  Blood Thinner Instructions: N/A Aspirin Instructions: Last Dose:  Activity level:   Can go up a flight of stairs and perform activities of daily living without stopping and without symptoms of chest pain or shortness of breath.  Able to exercise without symptoms  Anesthesia review:  Hx of atypical chest pain followed by cardiology.  Heart murmur during pregnancy.  Patient states that cardiologist thinks chest pain is due to anxiety.  Patient takes Prozac for anxiety and feels that it has helped the chest pain but she continues to have periodic episodes of chest pain.  Patient denies shortness of breath, fever, cough and chest pain at PAT appointment  Patient verbalized understanding of instructions that were given to them at the PAT appointment. Patient was also instructed that they will need to review over the PAT instructions again at home before surgery.

## 2021-07-29 NOTE — Progress Notes (Signed)
Sent message, via epic in basket, requesting order in epic from surgeon     07/29/21 0854  Preop Orders  Has preop orders? No  Name of staff/physician contacted for orders(Indicate phone or IB message) S. Gross, MD.

## 2021-07-30 ENCOUNTER — Ambulatory Visit: Payer: Self-pay | Admitting: Surgery

## 2021-07-30 NOTE — H&P (Signed)
REFERRING PHYSICIAN: Thomas, [place]  Patient Care Team: Melford Aase, Dr. Rebeca Alert. (Inactive) as PCP - General  PROVIDER: Hollace Kinnier, MD  [facility] MRN: [id number] DOB: 04-08-1970 DATE OF ENCOUNTER: 04/02/2021  SUBJECTIVE   Chief Complaint: parastomal hernia    History of Present Illness: Joanna Yang is a 51 y.o. female who is seen today  as an office consultation at the request of Dr. Marcello Moores  for evaluation of parastomal hernia  .   Pleasant woman here with her husband. History of severe chronic constipation. Underwent hysterectomy by Dr. Lyda Kalata. Had postoperative swelling discomfort and had evidence of a closed internal hernia of colon and small bowel. Required emergent surgery. Interoperative consultations with Dr. Harlow Asa to Dr. Marcello Moores myself. Undergoing a left colectomy with distal transverse colostomy. She recovered from that. She has been able to manage her constipation much better with just taking MiraLAX once a day -usually empties the bag twice a day. Without it, she gets into trouble.. This is the best she felt in years without any bloating or discomfort.  There was discussion about trying to see if he can hookup it given her constipation issues underwent pelvic floor work-up. Pelvic floor physical therapy done. Anal manometry noted decent tone but poor evacuation function and pelvic floor defecation dysfunction. It was felt by my partner, Dr. Marcello Moores that that would be a poor idea to proceed with colostomy takedown as it could cause recurrent severe issues.  Patient notes that she has had increasing bulging and swelling around her colostomy. Since it is most likely going to be permanent, discussion about considering parastomal hernia was discussed. Internal referral made to me for my expertise in this. Patient is able to keep her bag on with the help of a belt. Usually evacuates once a day. Has to change the appliance every few days.  Patient notes also that  she still has some external hemorrhoids and wondering if that could be dealt with. Discussed with her gynecologist who recommended discussing with me since she was coming today. Patient denies any bleeding pain itching or discomfort now that she is permanently diverted. However she can still feel hemorrhoid or to back there and wonders if that could be trimmed off. Looking at Dr. Elza Rafter note, there was concern perhaps of some left groin pain and possibility of an inguinal hernia  Patient is here with her husband. She can walk at least 23 minutes not difficulty. No cardiopulmonary issues since her surgery in 2020 on. She does not smoke. Nondiabetic. She does have sleep apnea for which she uses a mouthpiece. She is to have urinary tract infections in the past but none since this was done.  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   Colostomy status (CMS-HCC)   Past Surgical History:  Procedure Laterality Date   abdominal exploratory surgery N/A   CESAREAN SECTION   sterectomy surgery    Allergies  Allergen Reactions   Sulfa (Sulfonamide Antibiotics) Swelling and Other (See Comments)  Facial swelling and itching   Sulfamethoxazole-Trimethoprim Swelling   Current Outpatient Medications on File Prior to Visit  Medication Sig Dispense Refill   cetirizine (ZYRTEC) 10 MG tablet Take 10 mg  by mouth once daily   cranberry fruit extract (CRANBERRY CONCENTRATE ORAL) Take by mouth   FLUoxetine (PROZAC) 20 MG capsule Take 20 mg by mouth once daily   LACTOBACILLUS ACIDOPHILUS (PROBIOTIC ORAL) Take 1 capsule by mouth once daily.   omeprazole (PRILOSEC) 20 MG DR capsule Take 20 mg by mouth once daily.  11   polyethylene glycol (MIRALAX) packet Take by mouth once daily.   No current facility-administered medications on file prior to visit.   Family History  Problem Relation Age of Onset   Obesity Mother   High blood pressure (Hypertension) Mother   Hyperlipidemia (Elevated cholesterol) Mother   Diabetes Mother   Deep vein thrombosis (DVT or abnormal blood clot formation) Mother   High blood pressure (Hypertension) Father   Hyperlipidemia (Elevated cholesterol) Father   Obesity Sister   Breast cancer Sister    Social History   Tobacco Use  Smoking Status Never  Smokeless Tobacco Never    Social History   Socioeconomic History   Marital status: Married  Tobacco Use   Smoking status: Never   Smokeless tobacco: Never  Substance and Sexual Activity   Alcohol use: No   Drug use: No   ############################################################  Review of Systems: A complete review of systems (ROS) was obtained from the patient. I have reviewed this information and discussed as appropriate with the patient. See HPI as well for other pertinent ROS.  Constitutional: No fevers, chills, sweats. Weight stable Eyes: No vision changes, No discharge HENT: No sore throats, nasal drainage Lymph: No neck swelling, No bruising easily Pulmonary: No cough, productive sputum CV: No orthopnea, PND Patient walks 30 minutes without difficulty. No exertional chest/neck/shoulder/arm pain.  GI: No personal nor family history of GI/colon cancer, inflammatory bowel disease, irritable bowel syndrome, allergy such as Celiac Sprue, dietary/dairy problems, colitis, ulcers nor gastritis. No recent sick contacts/gastroenteritis. No travel outside the country. No changes in diet.  Renal: No UTIs, No hematuria Genital: No drainage, bleeding, masses Musculoskeletal: No severe joint pain. Good ROM major joints Skin: No sores or lesions Heme/Lymph: No easy bleeding. No swollen lymph nodes  OBJECTIVE    There were no vitals filed for this visit.   There is no height or weight on file to calculate BMI.  PHYSICAL EXAM:  Constitutional: Not cachectic. Hygeine adequate. Vitals signs as above.  Eyes: Pupils reactive, normal extraocular movements. Sclera nonicteric Neuro: CN II-XII intact. No major focal sensory defects. No major motor deficits. Lymph: No head/neck/groin lymphadenopathy Psych: No severe agitation. No severe anxiety. Judgment & insight Adequate, Oriented x4, HENT: Normocephalic, Mucus membranes moist. No thrush. Hearing: adequate Neck: Supple, No tracheal deviation. No obvious thyromegaly Chest: No pain to chest wall compression. Good respiratory excursion. No audible wheezing CV: Pulses intact. Regular rhythm. No major extremity edema Ext: No obvious deformity or contracture. Edema: Not present. No cyanosis Skin: No major subcutaneous nodules. Warm and dry Musculoskeletal: Severe joint rigidity not present. No obvious clubbing. No digital petechiae. Mobility: no assist device moving easily without restrictions  Abdomen: Flat Soft. Nondistended. Nontender. Hernia: Not present. Diastasis recti: Not present. No hepatomegaly. No splenomegaly.  Genital/Pelvic: Inguinal hernia: Not present. Inguinal lymph nodes: without lymphadenopathy nor hidradenitis.   Rectal:   ##################################  Perianal skin Clean with good hygiene  Pruritis ani: Not present Pilonidal disease: Not present Condyloma / warts: Not present  Anal fissure: Not present Perirectal abscess/fistula Not present External hemorrhoids small right anterior internal/external prolapsed hemorrhoid tag. Smaller left lateral 1 is well. Not  irritated or annoyed.  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 Some old mucus balls in the rectum. I was able to evacuate 1 but was not able to  retrieve the other 1 since she was uncomfortable. Prostate: N/A Rectal masses: Not present  Other significant findings: N/A  Patient examined with assistance of female Medical Assistant in the room with patient in decubitus position .  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  Is felt by my partner, Dr. Marcello Moores, that most likely she would do better with a permanent colostomy. The fact that the patient has felt better now than in years, she agrees. I concur with the recommendation.  The fact that she is already having issues with her colostomy prolapsing and this will be permanent, it is reasonable to consider parastomal hernia repair. I would plan a laparoscopic approach. Most likely a [place] approach. Perhaps Pkasix on the colon itself and then a more permanent dual sided Bard polypropylene mesh for good coverage. More recent data implies that polypropylene use is safer than we initially thought. Make sure she does not have any incisional hernias.  The anatomy & physiology of the abdominal wall was discussed. The pathophysiology of hernias was discussed. Natural  history risks without surgery including progeressive enlargement, pain, incarceration, & strangulation was discussed. Contributors to complications such as smoking, obesity, diabetes, prior surgery, etc were discussed.   I feel the risks of no intervention will lead to serious problems that outweigh the operative risks; therefore, I recommended surgery to reduce and repair the hernia. I explained laparoscopic techniques with possible need for an open approach. I noted the probable use of mesh to patch and/or buttress the hernia repair  Risks such as bleeding, infection, abscess, need for further treatment, injury to other organs, need for repair of tissues / organs, stroke, heart attack, death, and other risks were discussed. I noted a good likelihood this will help address the problem. Goals of post-operative recovery were discussed as well. Possibility that this will not correct all symptoms was explained. I stressed the importance of low-impact activity, aggressive pain control, avoiding constipation, & not pushing through pain to minimize risk of post-operative chronic pain or injury. Possibility of reherniation especially with smoking, obesity, diabetes, immunosuppression, and other health conditions was discussed. We will work to minimize complications.   An educational handout further explaining the pathology & treatment options was given as well. Questions were answered. The patient expresses understanding & wishes to proceed with surgery.   She wondered if there was discussion about revision of the colostomy. I would not relocate it necessarily since it seems to handle pouching okay. I will discuss with Dr. Marcello Moores to see if she feels like we need to  resect the excess colostomy and revise it at the end of the case after mesh repair has been done versus seeing if we can just reduce it and [place] it. With the patient's severe constipation, resecting some extra colon most likely not be a major  issue. With her transverse colon, I suspect it will be challenging to keep her from having redundant tissue that want to prolapse out and probably the Gerald Dexter is all she will need. But we will see.  I see no evidence of any incisional or inguinal hernias on exam today. She has not give me of any symptoms there so I do not feel strongly we need Pete CAT scan.  She does have a few external hemorrhoids and some chronically irritated internal hemorrhoids. However there is nothing suspicious for malignancy or condyloma. She is not having issues of bleeding, leakage, pain, irritation. I am guarded against doing hemorrhoid surgery just for the sake of doing it as it would increase pain and theoretically cause risk of infection of mesh at the time of a parastomal hernia repair. I would table that for now and she can rethink it. She seems also reassured with that.  She wonders if she should get a colonoscopy. Her last one was Dr. Collene Mares in 2020. It was incomplete due to poor prep. She has never had a repeat event hysterectomy and emergent colectomy/colostomy.. She has since switched to Dr. Alessandra Bevels who her husband uses at [facility] for convenience. She has never had any evidence of polyps on colonoscopy or pathology, but it may be wise to get a more complete colonoscopy to make sure were not missing anything, especially since I am going to be placing permanent mesh.   ########################################################  Adin Hector, MD, FACS, MASCRS Esophageal, Gastrointestinal & Colorectal Surgery Robotic and Minimally Invasive Surgery  [facility], [place]  [place]. [address], [place], [place] [address] [phone] Fax [phone] Main

## 2021-07-30 NOTE — H&P (View-Only) (Signed)
REFERRING PHYSICIAN: Thomas, [place]  Patient Care Team: Melford Aase, Dr. Rebeca Alert. (Inactive) as PCP - General  PROVIDER: Hollace Kinnier, MD  [facility] MRN: [id number] DOB: [birth year] DATE OF ENCOUNTER: 04/02/2021  SUBJECTIVE   Chief Complaint: parastomal hernia    History of Present Illness: Joanna Yang is a 51 y.o. female who is seen today  as an office consultation at the request of Dr. Marcello Moores  for evaluation of parastomal hernia  .   Pleasant woman here with her husband. History of severe chronic constipation. Underwent hysterectomy by Dr. Lyda Kalata. Had postoperative swelling discomfort and had evidence of a closed internal hernia of colon and small bowel. Required emergent surgery. Interoperative consultations with Dr. Harlow Asa to Dr. Marcello Moores myself. Undergoing a left colectomy with distal transverse colostomy. She recovered from that. She has been able to manage her constipation much better with just taking MiraLAX once a day -usually empties the bag twice a day. Without it, she gets into trouble.. This is the best she felt in years without any bloating or discomfort.  There was discussion about trying to see if he can hookup it given her constipation issues underwent pelvic floor work-up. Pelvic floor physical therapy done. Anal manometry noted decent tone but poor evacuation function and pelvic floor defecation dysfunction. It was felt by my partner, Dr. Marcello Moores that that would be a poor idea to proceed with colostomy takedown as it could cause recurrent severe issues.  Patient notes that she has had increasing bulging and swelling around her colostomy. Since it is most likely going to be permanent, discussion about considering parastomal hernia was discussed. Internal referral made to me for my expertise in this. Patient is able to keep her bag on with the help of a belt. Usually evacuates once a day. Has to change the appliance every few days.  Patient notes also that  she still has some external hemorrhoids and wondering if that could be dealt with. Discussed with her gynecologist who recommended discussing with me since she was coming today. Patient denies any bleeding pain itching or discomfort now that she is permanently diverted. However she can still feel hemorrhoid or to back there and wonders if that could be trimmed off. Looking at Dr. Elza Rafter note, there was concern perhaps of some left groin pain and possibility of an inguinal hernia  Patient is here with her husband. She can walk at least 23 minutes not difficulty. No cardiopulmonary issues since her surgery in 2020 on. She does not smoke. Nondiabetic. She does have sleep apnea for which she uses a mouthpiece. She is to have urinary tract infections in the past but none since this was done.  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   Colostomy status (CMS-HCC)   Past Surgical History:  Procedure Laterality Date   abdominal exploratory surgery N/A   CESAREAN SECTION   sterectomy surgery    Allergies  Allergen Reactions   Sulfa (Sulfonamide Antibiotics) Swelling and Other (See Comments)  Facial swelling and itching   Sulfamethoxazole-Trimethoprim Swelling   Current Outpatient Medications on File Prior to Visit  Medication Sig Dispense Refill   cetirizine (ZYRTEC) 10 MG tablet Take 10 mg  by mouth once daily   cranberry fruit extract (CRANBERRY CONCENTRATE ORAL) Take by mouth   FLUoxetine (PROZAC) 20 MG capsule Take 20 mg by mouth once daily   LACTOBACILLUS ACIDOPHILUS (PROBIOTIC ORAL) Take 1 capsule by mouth once daily.   omeprazole (PRILOSEC) 20 MG DR capsule Take 20 mg by mouth once daily.  11   polyethylene glycol (MIRALAX) packet Take by mouth once daily.   No current facility-administered medications on file prior to visit.   Family History  Problem Relation Age of Onset   Obesity Mother   High blood pressure (Hypertension) Mother   Hyperlipidemia (Elevated cholesterol) Mother   Diabetes Mother   Deep vein thrombosis (DVT or abnormal blood clot formation) Mother   High blood pressure (Hypertension) Father   Hyperlipidemia (Elevated cholesterol) Father   Obesity Sister   Breast cancer Sister    Social History   Tobacco Use  Smoking Status Never  Smokeless Tobacco Never    Social History   Socioeconomic History   Marital status: Married  Tobacco Use   Smoking status: Never   Smokeless tobacco: Never  Substance and Sexual Activity   Alcohol use: No   Drug use: No   ############################################################  Review of Systems: A complete review of systems (ROS) was obtained from the patient. I have reviewed this information and discussed as appropriate with the patient. See HPI as well for other pertinent ROS.  Constitutional: No fevers, chills, sweats. Weight stable Eyes: No vision changes, No discharge HENT: No sore throats, nasal drainage Lymph: No neck swelling, No bruising easily Pulmonary: No cough, productive sputum CV: No orthopnea, PND Patient walks 30 minutes without difficulty. No exertional chest/neck/shoulder/arm pain.  GI: No personal nor family history of GI/colon cancer, inflammatory bowel disease, irritable bowel syndrome, allergy such as Celiac Sprue, dietary/dairy problems, colitis, ulcers nor gastritis. No recent sick contacts/gastroenteritis. No travel outside the country. No changes in diet.  Renal: No UTIs, No hematuria Genital: No drainage, bleeding, masses Musculoskeletal: No severe joint pain. Good ROM major joints Skin: No sores or lesions Heme/Lymph: No easy bleeding. No swollen lymph nodes  OBJECTIVE    There were no vitals filed for this visit.   There is no height or weight on file to calculate BMI.  PHYSICAL EXAM:  Constitutional: Not cachectic. Hygeine adequate. Vitals signs as above.  Eyes: Pupils reactive, normal extraocular movements. Sclera nonicteric Neuro: CN II-XII intact. No major focal sensory defects. No major motor deficits. Lymph: No head/neck/groin lymphadenopathy Psych: No severe agitation. No severe anxiety. Judgment & insight Adequate, Oriented x4, HENT: Normocephalic, Mucus membranes moist. No thrush. Hearing: adequate Neck: Supple, No tracheal deviation. No obvious thyromegaly Chest: No pain to chest wall compression. Good respiratory excursion. No audible wheezing CV: Pulses intact. Regular rhythm. No major extremity edema Ext: No obvious deformity or contracture. Edema: Not present. No cyanosis Skin: No major subcutaneous nodules. Warm and dry Musculoskeletal: Severe joint rigidity not present. No obvious clubbing. No digital petechiae. Mobility: no assist device moving easily without restrictions  Abdomen: Flat Soft. Nondistended. Nontender. Hernia: Not present. Diastasis recti: Not present. No hepatomegaly. No splenomegaly.  Genital/Pelvic: Inguinal hernia: Not present. Inguinal lymph nodes: without lymphadenopathy nor hidradenitis.   Rectal:   ##################################  Perianal skin Clean with good hygiene  Pruritis ani: Not present Pilonidal disease: Not present Condyloma / warts: Not present  Anal fissure: Not present Perirectal abscess/fistula Not present External hemorrhoids small right anterior internal/external prolapsed hemorrhoid tag. Smaller left lateral 1 is well. Not  irritated or annoyed.  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 Some old mucus balls in the rectum. I was able to evacuate 1 but was not able to  retrieve the other 1 since she was uncomfortable. Prostate: N/A Rectal masses: Not present  Other significant findings: N/A  Patient examined with assistance of female Medical Assistant in the room with patient in decubitus position .  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  Is felt by my partner, Dr. Marcello Moores, that most likely she would do better with a permanent colostomy. The fact that the patient has felt better now than in years, she agrees. I concur with the recommendation.  The fact that she is already having issues with her colostomy prolapsing and this will be permanent, it is reasonable to consider parastomal hernia repair. I would plan a laparoscopic approach. Most likely a Sugarbaker approach. Perhaps Pkasix on the colon itself and then a more permanent dual sided Bard polypropylene mesh for good coverage. More recent data implies that polypropylene use is safer than we initially thought. Make sure she does not have any incisional hernias.  The anatomy & physiology of the abdominal wall was discussed. The pathophysiology of hernias was discussed. Natural  history risks without surgery including progeressive enlargement, pain, incarceration, & strangulation was discussed. Contributors to complications such as smoking, obesity, diabetes, prior surgery, etc were discussed.   I feel the risks of no intervention will lead to serious problems that outweigh the operative risks; therefore, I recommended surgery to reduce and repair the hernia. I explained laparoscopic techniques with possible need for an open approach. I noted the probable use of mesh to patch and/or buttress the hernia repair  Risks such as bleeding, infection, abscess, need for further treatment, injury to other organs, need for repair of tissues / organs, stroke, heart attack, death, and other risks were discussed. I noted a good likelihood this will help address the problem. Goals of post-operative recovery were discussed as well. Possibility that this will not correct all symptoms was explained. I stressed the importance of low-impact activity, aggressive pain control, avoiding constipation, & not pushing through pain to minimize risk of post-operative chronic pain or injury. Possibility of reherniation especially with smoking, obesity, diabetes, immunosuppression, and other health conditions was discussed. We will work to minimize complications.   An educational handout further explaining the pathology & treatment options was given as well. Questions were answered. The patient expresses understanding & wishes to proceed with surgery.   She wondered if there was discussion about revision of the colostomy. I would not relocate it necessarily since it seems to handle pouching okay. I will discuss with Dr. Marcello Moores to see if she feels like we need to  resect the excess colostomy and revise it at the end of the case after mesh repair has been done versus seeing if we can just reduce it and Sugarbaker it. With the patient's severe constipation, resecting some extra colon most likely not be a major  issue. With her transverse colon, I suspect it will be challenging to keep her from having redundant tissue that want to prolapse out and probably the Gerald Dexter is all she will need. But we will see.  I see no evidence of any incisional or inguinal hernias on exam today. She has not give me of any symptoms there so I do not feel strongly we need Pete CAT scan.  She does have a few external hemorrhoids and some chronically irritated internal hemorrhoids. However there is nothing suspicious for malignancy or condyloma. She is not having issues of bleeding, leakage, pain, irritation. I am guarded against doing hemorrhoid surgery just for the sake of doing it as it would increase pain and theoretically cause risk of infection of mesh at the time of a parastomal hernia repair. I would table that for now and she can rethink it. She seems also reassured with that.  She wonders if she should get a colonoscopy. Her last one was Dr. Collene Mares in 2020. It was incomplete due to poor prep. She has never had a repeat event hysterectomy and emergent colectomy/colostomy.. She has since switched to Dr. Alessandra Bevels who her husband uses at [facility] for convenience. She has never had any evidence of polyps on colonoscopy or pathology, but it may be wise to get a more complete colonoscopy to make sure were not missing anything, especially since I am going to be placing permanent mesh.   ########################################################  Adin Hector, MD, FACS, MASCRS Esophageal, Gastrointestinal & Colorectal Surgery Robotic and Minimally Invasive Surgery  [facility], [place] [place], [place] [address] [phone] Fax [phone] Main

## 2021-08-05 ENCOUNTER — Other Ambulatory Visit: Payer: Self-pay

## 2021-08-05 ENCOUNTER — Encounter (HOSPITAL_COMMUNITY)
Admission: RE | Admit: 2021-08-05 | Discharge: 2021-08-05 | Disposition: A | Discharge status: Home / Self Care | Payer: Managed Care, Other (non HMO) | Source: Ambulatory Visit | Attending: Surgery | Admitting: Surgery

## 2021-08-05 ENCOUNTER — Encounter (HOSPITAL_COMMUNITY): Payer: Self-pay

## 2021-08-05 VITALS — BP 119/89 | HR 77 | Temp 98.4°F | Resp 14 | Ht 66.0 in | Wt 165.2 lb

## 2021-08-05 DIAGNOSIS — Z01812 Encounter for preprocedural laboratory examination: Secondary | ICD-10-CM | POA: Insufficient documentation

## 2021-08-05 DIAGNOSIS — D649 Anemia, unspecified: Secondary | ICD-10-CM | POA: Diagnosis not present

## 2021-08-05 HISTORY — DX: Gastro-esophageal reflux disease without esophagitis: K21.9

## 2021-08-05 HISTORY — DX: Sleep apnea, unspecified: G47.30

## 2021-08-05 LAB — CBC
HCT: 43.1 % (ref 36.0–46.0)
Hemoglobin: 14.5 g/dL (ref 12.0–15.0)
MCH: 29.8 pg (ref 26.0–34.0)
MCHC: 33.6 g/dL (ref 30.0–36.0)
MCV: 88.5 fL (ref 80.0–100.0)
Platelets: 187 10*3/uL (ref 150–400)
RBC: 4.87 MIL/uL (ref 3.87–5.11)
RDW: 12.8 % (ref 11.5–15.5)
WBC: 4.3 10*3/uL (ref 4.0–10.5)
nRBC: 0 % (ref 0.0–0.2)

## 2021-08-12 ENCOUNTER — Encounter (HOSPITAL_COMMUNITY): Payer: Self-pay | Admitting: Surgery

## 2021-08-12 NOTE — Anesthesia Preprocedure Evaluation (Addendum)
Anesthesia Evaluation  Patient identified by MRN, date of birth, ID band Patient awake    Reviewed: Allergy & Precautions, NPO status , Patient's Chart, lab work & pertinent test results, reviewed documented beta blocker date and time   History of Anesthesia Complications (+) PONV and history of anesthetic complications  Airway Mallampati: II  TM Distance: >3 FB Neck ROM: Full    Dental no notable dental hx. (+) Teeth Intact, Dental Advisory Given   Pulmonary sleep apnea , pneumonia, resolved,  Uses a mouth guard   Pulmonary exam normal breath sounds clear to auscultation       Cardiovascular hypertension, Pt. on medications and Pt. on home beta blockers Normal cardiovascular exam+ Valvular Problems/Murmurs MR  Rhythm:Regular Rate:Normal     Neuro/Psych Anxiety negative neurological ROS     GI/Hepatic Neg liver ROS, GERD  Medicated,Hx/o colon resection and transverse colostomy done on emergent basis for incarcerated internal hernia   Endo/Other  negative endocrine ROS  Renal/GU negative Renal ROS  negative genitourinary   Musculoskeletal Laparoscopic parastomal hernia   Abdominal   Peds  Hematology  (+) Blood dyscrasia, anemia ,   Anesthesia Other Findings   Reproductive/Obstetrics                            Anesthesia Physical Anesthesia Plan  ASA: 3  Anesthesia Plan: General   Post-op Pain Management: Regional block*, Ketamine IV*, Precedex and Dilaudid IV   Induction: Intravenous  PONV Risk Score and Plan: 4 or greater and Scopolamine patch - Pre-op, Midazolam, Treatment may vary due to age or medical condition, Ondansetron, Aprepitant and Dexamethasone  Airway Management Planned: Oral ETT  Additional Equipment: None  Intra-op Plan:   Post-operative Plan: Extubation in OR  Informed Consent: I have reviewed the patients History and Physical, chart, labs and discussed the  procedure including the risks, benefits and alternatives for the proposed anesthesia with the patient or authorized representative who has indicated his/her understanding and acceptance.     Dental advisory given  Plan Discussed with: CRNA and Anesthesiologist  Anesthesia Plan Comments:        Anesthesia Quick Evaluation

## 2021-08-13 ENCOUNTER — Inpatient Hospital Stay (HOSPITAL_COMMUNITY)
Admission: AD | Admit: 2021-08-13 | Discharge: 2021-08-17 | DRG: 354 | Disposition: A | Discharge status: Home / Self Care | Payer: Managed Care, Other (non HMO) | Attending: Surgery | Admitting: Surgery

## 2021-08-13 ENCOUNTER — Encounter (HOSPITAL_COMMUNITY)
Admission: AD | Disposition: A | Discharge status: Home / Self Care | Payer: Self-pay | Source: Ambulatory Visit | Attending: Surgery

## 2021-08-13 ENCOUNTER — Other Ambulatory Visit: Payer: Self-pay

## 2021-08-13 ENCOUNTER — Ambulatory Visit (HOSPITAL_BASED_OUTPATIENT_CLINIC_OR_DEPARTMENT_OTHER): Payer: Managed Care, Other (non HMO) | Admitting: Anesthesiology

## 2021-08-13 ENCOUNTER — Ambulatory Visit (HOSPITAL_COMMUNITY): Payer: Managed Care, Other (non HMO) | Admitting: Physician Assistant

## 2021-08-13 ENCOUNTER — Encounter (HOSPITAL_COMMUNITY): Payer: Self-pay | Admitting: Surgery

## 2021-08-13 DIAGNOSIS — K648 Other hemorrhoids: Secondary | ICD-10-CM | POA: Diagnosis present

## 2021-08-13 DIAGNOSIS — Z803 Family history of malignant neoplasm of breast: Secondary | ICD-10-CM

## 2021-08-13 DIAGNOSIS — Z833 Family history of diabetes mellitus: Secondary | ICD-10-CM

## 2021-08-13 DIAGNOSIS — F411 Generalized anxiety disorder: Secondary | ICD-10-CM | POA: Diagnosis present

## 2021-08-13 DIAGNOSIS — Z79890 Hormone replacement therapy (postmenopausal): Secondary | ICD-10-CM

## 2021-08-13 DIAGNOSIS — K567 Ileus, unspecified: Secondary | ICD-10-CM | POA: Diagnosis not present

## 2021-08-13 DIAGNOSIS — K5909 Other constipation: Secondary | ICD-10-CM | POA: Diagnosis present

## 2021-08-13 DIAGNOSIS — Z818 Family history of other mental and behavioral disorders: Secondary | ICD-10-CM

## 2021-08-13 DIAGNOSIS — I340 Nonrheumatic mitral (valve) insufficiency: Secondary | ICD-10-CM | POA: Diagnosis not present

## 2021-08-13 DIAGNOSIS — Z933 Colostomy status: Secondary | ICD-10-CM

## 2021-08-13 DIAGNOSIS — Z9049 Acquired absence of other specified parts of digestive tract: Secondary | ICD-10-CM

## 2021-08-13 DIAGNOSIS — Z8249 Family history of ischemic heart disease and other diseases of the circulatory system: Secondary | ICD-10-CM

## 2021-08-13 DIAGNOSIS — G4730 Sleep apnea, unspecified: Secondary | ICD-10-CM

## 2021-08-13 DIAGNOSIS — K644 Residual hemorrhoidal skin tags: Secondary | ICD-10-CM | POA: Diagnosis present

## 2021-08-13 DIAGNOSIS — Z79899 Other long term (current) drug therapy: Secondary | ICD-10-CM

## 2021-08-13 DIAGNOSIS — K458 Other specified abdominal hernia without obstruction or gangrene: Secondary | ICD-10-CM

## 2021-08-13 DIAGNOSIS — K435 Parastomal hernia without obstruction or  gangrene: Secondary | ICD-10-CM | POA: Diagnosis not present

## 2021-08-13 DIAGNOSIS — Z87440 Personal history of urinary (tract) infections: Secondary | ICD-10-CM

## 2021-08-13 DIAGNOSIS — I10 Essential (primary) hypertension: Secondary | ICD-10-CM

## 2021-08-13 DIAGNOSIS — Z888 Allergy status to other drugs, medicaments and biological substances status: Secondary | ICD-10-CM

## 2021-08-13 DIAGNOSIS — D649 Anemia, unspecified: Principal | ICD-10-CM

## 2021-08-13 DIAGNOSIS — K219 Gastro-esophageal reflux disease without esophagitis: Secondary | ICD-10-CM | POA: Diagnosis present

## 2021-08-13 DIAGNOSIS — Z90710 Acquired absence of both cervix and uterus: Secondary | ICD-10-CM

## 2021-08-13 HISTORY — PX: LYSIS OF ADHESION: SHX5961

## 2021-08-13 HISTORY — PX: LAPAROSCOPIC PARASTOMAL HERNIA: SHX6347

## 2021-08-13 LAB — TYPE AND SCREEN
ABO/RH(D): A POS
Antibody Screen: NEGATIVE

## 2021-08-13 SURGERY — REPAIR, HERNIA, PARASTOMAL, LAPAROSCOPIC
Anesthesia: General

## 2021-08-13 MED ORDER — METOPROLOL TARTRATE 5 MG/5ML IV SOLN: 5.0000 mg | Freq: Four times a day (QID) | INTRAVENOUS | Status: DC | PRN

## 2021-08-13 MED ORDER — CEFAZOLIN SODIUM-DEXTROSE 2-4 GM/100ML-% IV SOLN
2.0000 g | INTRAVENOUS | Status: AC
  Administered 2021-08-13: 2 g via INTRAVENOUS
  Filled 2021-08-13: qty 100

## 2021-08-13 MED ORDER — METHOCARBAMOL 1000 MG/10ML IJ SOLN: 1000.0000 mg | Freq: Four times a day (QID) | INTRAVENOUS | Status: DC | PRN

## 2021-08-13 MED ORDER — MAGNESIUM HYDROXIDE 400 MG/5ML PO SUSP: 30.0000 mL | Freq: Every day | ORAL | Status: DC | PRN

## 2021-08-13 MED ORDER — CEFAZOLIN SODIUM-DEXTROSE 2-4 GM/100ML-% IV SOLN
2.0000 g | Freq: Three times a day (TID) | INTRAVENOUS | Status: AC
  Administered 2021-08-13: 2 g via INTRAVENOUS
  Filled 2021-08-13: qty 100

## 2021-08-13 MED ORDER — ENSURE PRE-SURGERY PO LIQD
592.0000 mL | Freq: Once | ORAL | Status: DC
  Filled 2021-08-13: qty 592

## 2021-08-13 MED ORDER — PROPOFOL 10 MG/ML IV BOLUS
INTRAVENOUS | Status: AC
  Filled 2021-08-13: qty 20

## 2021-08-13 MED ORDER — BUPIVACAINE LIPOSOME 1.3 % IJ SUSP
INTRAMUSCULAR | Status: AC
  Filled 2021-08-13: qty 20

## 2021-08-13 MED ORDER — AMISULPRIDE (ANTIEMETIC) 5 MG/2ML IV SOLN: 10.0000 mg | Freq: Once | INTRAVENOUS | Status: DC | PRN

## 2021-08-13 MED ORDER — PHENYLEPHRINE HCL (PRESSORS) 10 MG/ML IV SOLN
INTRAVENOUS | Status: AC
  Filled 2021-08-13: qty 1

## 2021-08-13 MED ORDER — MIDAZOLAM HCL 2 MG/2ML IJ SOLN
INTRAMUSCULAR | Status: AC
Start: 2021-08-13 — End: ?
  Filled 2021-08-13: qty 2

## 2021-08-13 MED ORDER — METRONIDAZOLE 500 MG/100ML IV SOLN
500.0000 mg | Freq: Two times a day (BID) | INTRAVENOUS | Status: AC
  Administered 2021-08-13: 500 mg via INTRAVENOUS
  Filled 2021-08-13: qty 100

## 2021-08-13 MED ORDER — CHLORHEXIDINE GLUCONATE CLOTH 2 % EX PADS: 6.0000 | MEDICATED_PAD | Freq: Once | CUTANEOUS | Status: DC

## 2021-08-13 MED ORDER — PHENYLEPHRINE 80 MCG/ML (10ML) SYRINGE FOR IV PUSH (FOR BLOOD PRESSURE SUPPORT)
PREFILLED_SYRINGE | INTRAVENOUS | Status: AC
  Filled 2021-08-13: qty 10

## 2021-08-13 MED ORDER — LACTATED RINGERS IV BOLUS
1000.0000 mL | Freq: Three times a day (TID) | INTRAVENOUS | Status: AC | PRN
  Administered 2021-08-14: 1000 mL via INTRAVENOUS

## 2021-08-13 MED ORDER — ROSUVASTATIN CALCIUM 10 MG PO TABS
10.0000 mg | ORAL_TABLET | Freq: Every day | ORAL | Status: DC
  Administered 2021-08-13 – 2021-08-16 (×4): 10 mg via ORAL
  Filled 2021-08-13 (×4): qty 1

## 2021-08-13 MED ORDER — BUPIVACAINE-EPINEPHRINE 0.25% -1:200000 IJ SOLN
INTRAMUSCULAR | Status: AC
  Filled 2021-08-13: qty 1

## 2021-08-13 MED ORDER — ENOXAPARIN SODIUM 40 MG/0.4ML IJ SOSY
40.0000 mg | PREFILLED_SYRINGE | INTRAMUSCULAR | Status: DC
  Administered 2021-08-14 – 2021-08-17 (×4): 40 mg via SUBCUTANEOUS
  Filled 2021-08-13 (×4): qty 0.4

## 2021-08-13 MED ORDER — LIP MEDEX EX OINT
TOPICAL_OINTMENT | Freq: Two times a day (BID) | CUTANEOUS | Status: DC
  Administered 2021-08-13: 1 via TOPICAL
  Administered 2021-08-14: 75 via TOPICAL
  Administered 2021-08-15: 1 via TOPICAL
  Administered 2021-08-16: 75 via TOPICAL
  Filled 2021-08-13 (×2): qty 7

## 2021-08-13 MED ORDER — PROPOFOL 1000 MG/100ML IV EMUL
INTRAVENOUS | Status: AC
  Filled 2021-08-13: qty 100

## 2021-08-13 MED ORDER — ACETAMINOPHEN 500 MG PO TABS
1000.0000 mg | ORAL_TABLET | ORAL | Status: AC
  Administered 2021-08-13: 1000 mg via ORAL
  Filled 2021-08-13: qty 2

## 2021-08-13 MED ORDER — SCOPOLAMINE 1 MG/3DAYS TD PT72
1.0000 | MEDICATED_PATCH | TRANSDERMAL | Status: DC
Start: 2021-08-13 — End: 2021-08-13
  Administered 2021-08-13: 1.5 mg via TRANSDERMAL
  Filled 2021-08-13: qty 1

## 2021-08-13 MED ORDER — MELATONIN 5 MG PO TABS
5.0000 mg | ORAL_TABLET | Freq: Every evening | ORAL | Status: DC | PRN
Start: 2021-08-13 — End: 2021-08-17

## 2021-08-13 MED ORDER — PROCHLORPERAZINE MALEATE 10 MG PO TABS: 10.0000 mg | ORAL_TABLET | Freq: Four times a day (QID) | ORAL | Status: DC | PRN

## 2021-08-13 MED ORDER — ONDANSETRON HCL 4 MG/2ML IJ SOLN
4.0000 mg | Freq: Four times a day (QID) | INTRAMUSCULAR | Status: DC | PRN
  Administered 2021-08-14 – 2021-08-15 (×3): 4 mg via INTRAVENOUS
  Filled 2021-08-13 (×4): qty 2

## 2021-08-13 MED ORDER — DIPHENHYDRAMINE HCL 12.5 MG/5ML PO ELIX: 12.5000 mg | ORAL_SOLUTION | Freq: Four times a day (QID) | ORAL | Status: DC | PRN

## 2021-08-13 MED ORDER — BUPIVACAINE-EPINEPHRINE 0.25% -1:200000 IJ SOLN
INTRAMUSCULAR | Status: DC | PRN
  Administered 2021-08-13: 50 mL

## 2021-08-13 MED ORDER — METRONIDAZOLE 500 MG/100ML IV SOLN
500.0000 mg | INTRAVENOUS | Status: AC
  Administered 2021-08-13: 500 mg via INTRAVENOUS
  Filled 2021-08-13: qty 100

## 2021-08-13 MED ORDER — ROCURONIUM BROMIDE 10 MG/ML (PF) SYRINGE
PREFILLED_SYRINGE | INTRAVENOUS | Status: AC
  Filled 2021-08-13: qty 10

## 2021-08-13 MED ORDER — 0.9 % SODIUM CHLORIDE (POUR BTL) OPTIME
TOPICAL | Status: DC | PRN
  Administered 2021-08-13: 1000 mL

## 2021-08-13 MED ORDER — APREPITANT 40 MG PO CAPS
40.0000 mg | ORAL_CAPSULE | Freq: Once | ORAL | Status: AC
  Administered 2021-08-13: 40 mg via ORAL
  Filled 2021-08-13: qty 1

## 2021-08-13 MED ORDER — LACTATED RINGERS IV SOLN: INTRAVENOUS | Status: DC

## 2021-08-13 MED ORDER — SODIUM CHLORIDE 0.9 % IV SOLN: 250.0000 mL | INTRAVENOUS | Status: DC | PRN

## 2021-08-13 MED ORDER — SODIUM CHLORIDE 0.9% FLUSH
3.0000 mL | Freq: Two times a day (BID) | INTRAVENOUS | Status: DC
  Administered 2021-08-13 – 2021-08-17 (×7): 3 mL via INTRAVENOUS

## 2021-08-13 MED ORDER — ONDANSETRON HCL 4 MG/2ML IJ SOLN: 4.0000 mg | Freq: Once | INTRAMUSCULAR | Status: DC | PRN

## 2021-08-13 MED ORDER — HYDROMORPHONE HCL 1 MG/ML IJ SOLN
0.5000 mg | INTRAMUSCULAR | Status: DC | PRN
  Administered 2021-08-13 – 2021-08-14 (×2): 1 mg via INTRAVENOUS
  Filled 2021-08-13 (×2): qty 1

## 2021-08-13 MED ORDER — ORAL CARE MOUTH RINSE
15.0000 mL | Freq: Once | OROMUCOSAL | Status: AC
Start: 2021-08-13 — End: 2021-08-13

## 2021-08-13 MED ORDER — KETAMINE HCL 10 MG/ML IJ SOLN
INTRAMUSCULAR | Status: DC | PRN
  Administered 2021-08-13 (×2): 10 mg via INTRAVENOUS

## 2021-08-13 MED ORDER — DEXAMETHASONE SODIUM PHOSPHATE 10 MG/ML IJ SOLN
INTRAMUSCULAR | Status: AC
  Filled 2021-08-13: qty 1

## 2021-08-13 MED ORDER — CHLORHEXIDINE GLUCONATE 0.12 % MT SOLN
15.0000 mL | Freq: Once | OROMUCOSAL | Status: AC
  Administered 2021-08-13: 15 mL via OROMUCOSAL

## 2021-08-13 MED ORDER — LIDOCAINE HCL (PF) 2 % IJ SOLN
INTRAMUSCULAR | Status: AC
  Filled 2021-08-13: qty 5

## 2021-08-13 MED ORDER — LIDOCAINE HCL (PF) 2 % IJ SOLN
INTRAMUSCULAR | Status: AC
Start: 2021-08-13 — End: ?
  Filled 2021-08-13: qty 5

## 2021-08-13 MED ORDER — HYDROMORPHONE HCL 1 MG/ML IJ SOLN
0.2500 mg | INTRAMUSCULAR | Status: DC | PRN
  Administered 2021-08-13: 0.5 mg via INTRAVENOUS

## 2021-08-13 MED ORDER — MIDAZOLAM HCL 5 MG/5ML IJ SOLN
INTRAMUSCULAR | Status: DC | PRN
  Administered 2021-08-13: 2 mg via INTRAVENOUS

## 2021-08-13 MED ORDER — BUPIVACAINE LIPOSOME 1.3 % IJ SUSP
INTRAMUSCULAR | Status: DC | PRN
  Administered 2021-08-13: 20 mL

## 2021-08-13 MED ORDER — PROPOFOL 10 MG/ML IV BOLUS
INTRAVENOUS | Status: AC
Start: 2021-08-13 — End: ?
  Filled 2021-08-13: qty 20

## 2021-08-13 MED ORDER — SIMETHICONE 80 MG PO CHEW
40.0000 mg | CHEWABLE_TABLET | Freq: Four times a day (QID) | ORAL | Status: DC | PRN
  Administered 2021-08-14: 40 mg via ORAL
  Filled 2021-08-13: qty 1

## 2021-08-13 MED ORDER — FLUTICASONE PROPIONATE 50 MCG/ACT NA SUSP: 1.0000 | Freq: Every day | NASAL | Status: DC | PRN

## 2021-08-13 MED ORDER — TRAMADOL HCL 50 MG PO TABS: 50.0000 mg | ORAL_TABLET | Freq: Four times a day (QID) | ORAL | Status: DC | PRN

## 2021-08-13 MED ORDER — CLONAZEPAM 0.5 MG PO TABS: 0.5000 mg | ORAL_TABLET | Freq: Two times a day (BID) | ORAL | Status: DC | PRN

## 2021-08-13 MED ORDER — FLUOXETINE HCL 20 MG PO CAPS
40.0000 mg | ORAL_CAPSULE | Freq: Every day | ORAL | Status: DC
  Administered 2021-08-14 – 2021-08-17 (×4): 40 mg via ORAL
  Filled 2021-08-13 (×4): qty 2

## 2021-08-13 MED ORDER — FENTANYL CITRATE (PF) 100 MCG/2ML IJ SOLN
INTRAMUSCULAR | Status: DC | PRN
  Administered 2021-08-13 (×2): 50 ug via INTRAVENOUS
  Administered 2021-08-13: 100 ug via INTRAVENOUS
  Administered 2021-08-13: 50 ug via INTRAVENOUS

## 2021-08-13 MED ORDER — OXYCODONE HCL 5 MG PO TABS: 5.0000 mg | ORAL_TABLET | Freq: Four times a day (QID) | ORAL | 0 refills | Status: DC | PRN

## 2021-08-13 MED ORDER — SODIUM CHLORIDE 0.9% FLUSH: 3.0000 mL | INTRAVENOUS | Status: DC | PRN

## 2021-08-13 MED ORDER — BISACODYL 10 MG RE SUPP: 10.0000 mg | Freq: Every day | RECTAL | Status: DC | PRN

## 2021-08-13 MED ORDER — ENALAPRILAT 1.25 MG/ML IV SOLN: 0.6250 mg | Freq: Four times a day (QID) | INTRAVENOUS | Status: DC | PRN

## 2021-08-13 MED ORDER — ONDANSETRON 4 MG PO TBDP: 4.0000 mg | ORAL_TABLET | Freq: Four times a day (QID) | ORAL | Status: DC | PRN

## 2021-08-13 MED ORDER — PROPOFOL 10 MG/ML IV BOLUS
INTRAVENOUS | Status: DC | PRN
  Administered 2021-08-13: 25 ug/kg/min via INTRAVENOUS
  Administered 2021-08-13: 150 mg via INTRAVENOUS

## 2021-08-13 MED ORDER — GABAPENTIN 300 MG PO CAPS
300.0000 mg | ORAL_CAPSULE | ORAL | Status: AC
  Administered 2021-08-13: 300 mg via ORAL
  Filled 2021-08-13: qty 1

## 2021-08-13 MED ORDER — GABAPENTIN 300 MG PO CAPS
300.0000 mg | ORAL_CAPSULE | Freq: Two times a day (BID) | ORAL | Status: DC
  Administered 2021-08-13 – 2021-08-14 (×3): 300 mg via ORAL
  Filled 2021-08-13 (×3): qty 1

## 2021-08-13 MED ORDER — BUPIVACAINE LIPOSOME 1.3 % IJ SUSP: 20.0000 mL | Freq: Once | INTRAMUSCULAR | Status: DC

## 2021-08-13 MED ORDER — PHENYLEPHRINE 80 MCG/ML (10ML) SYRINGE FOR IV PUSH (FOR BLOOD PRESSURE SUPPORT)
PREFILLED_SYRINGE | INTRAVENOUS | Status: DC | PRN
  Administered 2021-08-13: 80 ug via INTRAVENOUS

## 2021-08-13 MED ORDER — LACTATED RINGERS IV SOLN: INTRAVENOUS | Status: DC | PRN

## 2021-08-13 MED ORDER — DIPHENHYDRAMINE HCL 50 MG/ML IJ SOLN: 12.5000 mg | Freq: Four times a day (QID) | INTRAMUSCULAR | Status: DC | PRN

## 2021-08-13 MED ORDER — GLYCOPYRROLATE PF 0.2 MG/ML IJ SOSY
PREFILLED_SYRINGE | INTRAMUSCULAR | Status: DC | PRN
  Administered 2021-08-13: .2 mg via INTRAVENOUS

## 2021-08-13 MED ORDER — ENSURE PRE-SURGERY PO LIQD
296.0000 mL | Freq: Once | ORAL | Status: DC
  Filled 2021-08-13: qty 296

## 2021-08-13 MED ORDER — ACETAMINOPHEN 500 MG PO TABS
1000.0000 mg | ORAL_TABLET | Freq: Four times a day (QID) | ORAL | Status: DC
  Administered 2021-08-13 – 2021-08-14 (×5): 1000 mg via ORAL
  Filled 2021-08-13 (×5): qty 2

## 2021-08-13 MED ORDER — ONDANSETRON HCL 4 MG/2ML IJ SOLN
INTRAMUSCULAR | Status: AC
  Filled 2021-08-13: qty 2

## 2021-08-13 MED ORDER — SUGAMMADEX SODIUM 200 MG/2ML IV SOLN
INTRAVENOUS | Status: DC | PRN
  Administered 2021-08-13: 200 mg via INTRAVENOUS

## 2021-08-13 MED ORDER — HYDROMORPHONE HCL 1 MG/ML IJ SOLN
INTRAMUSCULAR | Status: AC
  Administered 2021-08-13: 0.5 mg via INTRAVENOUS
  Filled 2021-08-13: qty 2

## 2021-08-13 MED ORDER — PHENYLEPHRINE HCL-NACL 20-0.9 MG/250ML-% IV SOLN
INTRAVENOUS | Status: DC | PRN
  Administered 2021-08-13: 35 ug/min via INTRAVENOUS

## 2021-08-13 MED ORDER — KETAMINE HCL 10 MG/ML IJ SOLN
INTRAMUSCULAR | Status: AC
  Filled 2021-08-13: qty 1

## 2021-08-13 MED ORDER — DEXAMETHASONE SODIUM PHOSPHATE 10 MG/ML IJ SOLN
INTRAMUSCULAR | Status: DC | PRN
  Administered 2021-08-13: 10 mg via INTRAVENOUS

## 2021-08-13 MED ORDER — FENTANYL CITRATE (PF) 250 MCG/5ML IJ SOLN
INTRAMUSCULAR | Status: AC
  Filled 2021-08-13: qty 5

## 2021-08-13 MED ORDER — SODIUM CHLORIDE 0.9 % IV SOLN: Freq: Three times a day (TID) | INTRAVENOUS | Status: DC | PRN

## 2021-08-13 MED ORDER — MAGIC MOUTHWASH: 15.0000 mL | Freq: Four times a day (QID) | ORAL | Status: DC | PRN

## 2021-08-13 MED ORDER — LORATADINE 10 MG PO TABS
10.0000 mg | ORAL_TABLET | Freq: Every day | ORAL | Status: DC
  Administered 2021-08-14 – 2021-08-17 (×4): 10 mg via ORAL
  Filled 2021-08-13 (×4): qty 1

## 2021-08-13 MED ORDER — CALCIUM POLYCARBOPHIL 625 MG PO TABS
625.0000 mg | ORAL_TABLET | Freq: Two times a day (BID) | ORAL | Status: DC
  Administered 2021-08-13 – 2021-08-14 (×3): 625 mg via ORAL
  Filled 2021-08-13 (×3): qty 1

## 2021-08-13 MED ORDER — ROCURONIUM BROMIDE 10 MG/ML (PF) SYRINGE
PREFILLED_SYRINGE | INTRAVENOUS | Status: DC | PRN
  Administered 2021-08-13: 80 mg via INTRAVENOUS
  Administered 2021-08-13: 20 mg via INTRAVENOUS
  Administered 2021-08-13: 10 mg via INTRAVENOUS

## 2021-08-13 MED ORDER — LIDOCAINE 2% (20 MG/ML) 5 ML SYRINGE
INTRAMUSCULAR | Status: DC | PRN
  Administered 2021-08-13: 80 mg via INTRAVENOUS

## 2021-08-13 MED ORDER — PROCHLORPERAZINE EDISYLATE 10 MG/2ML IJ SOLN
5.0000 mg | Freq: Four times a day (QID) | INTRAMUSCULAR | Status: DC | PRN
  Administered 2021-08-14: 10 mg via INTRAVENOUS
  Filled 2021-08-13: qty 2

## 2021-08-13 MED ORDER — OXYCODONE HCL 5 MG PO TABS
5.0000 mg | ORAL_TABLET | ORAL | Status: DC | PRN
  Administered 2021-08-13 – 2021-08-14 (×3): 10 mg via ORAL
  Filled 2021-08-13 (×4): qty 2

## 2021-08-13 MED ORDER — METHOCARBAMOL 500 MG PO TABS
500.0000 mg | ORAL_TABLET | Freq: Four times a day (QID) | ORAL | Status: DC | PRN
  Administered 2021-08-13: 500 mg via ORAL
  Filled 2021-08-13: qty 1

## 2021-08-13 MED ORDER — ONDANSETRON HCL 4 MG/2ML IJ SOLN
INTRAMUSCULAR | Status: DC | PRN
  Administered 2021-08-13: 4 mg via INTRAVENOUS

## 2021-08-13 SURGICAL SUPPLY — 45 items
APPLIER CLIP 5 13 M/L LIGAMAX5 (MISCELLANEOUS)
BAG COUNTER SPONGE SURGICOUNT (BAG) ×2 IMPLANT
BINDER ABDOMINAL 12 ML 46-62 (SOFTGOODS) IMPLANT
CABLE HIGH FREQUENCY MONO STRZ (ELECTRODE) ×2 IMPLANT
CHLORAPREP W/TINT 26 (MISCELLANEOUS) ×2 IMPLANT
CLIP APPLIE 5 13 M/L LIGAMAX5 (MISCELLANEOUS) IMPLANT
COVER SURGICAL LIGHT HANDLE (MISCELLANEOUS) ×2 IMPLANT
DERMABOND ADVANCED (GAUZE/BANDAGES/DRESSINGS) ×1
DERMABOND ADVANCED .7 DNX12 (GAUZE/BANDAGES/DRESSINGS) IMPLANT
DEVICE SECURE STRAP 25 ABSORB (INSTRUMENTS) ×1 IMPLANT
DEVICE TROCAR PUNCTURE CLOSURE (ENDOMECHANICALS) ×2 IMPLANT
DRAPE WARM FLUID 44X44 (DRAPES) ×2 IMPLANT
DRSG TEGADERM 2-3/8X2-3/4 SM (GAUZE/BANDAGES/DRESSINGS) ×4 IMPLANT
DRSG TEGADERM 4X4.75 (GAUZE/BANDAGES/DRESSINGS) ×2 IMPLANT
ELECT REM PT RETURN 15FT ADLT (MISCELLANEOUS) ×2 IMPLANT
GAUZE SPONGE 2X2 8PLY STRL LF (GAUZE/BANDAGES/DRESSINGS) IMPLANT
GLOVE ECLIPSE 8.0 STRL XLNG CF (GLOVE) ×2 IMPLANT
GLOVE INDICATOR 8.0 STRL GRN (GLOVE) ×2 IMPLANT
GOWN STRL REUS W/ TWL XL LVL3 (GOWN DISPOSABLE) ×3 IMPLANT
GOWN STRL REUS W/TWL XL LVL3 (GOWN DISPOSABLE) ×3
IRRIG SUCT STRYKERFLOW 2 WTIP (MISCELLANEOUS) ×2
IRRIGATION SUCT STRKRFLW 2 WTP (MISCELLANEOUS) IMPLANT
KIT BASIN OR (CUSTOM PROCEDURE TRAY) ×2 IMPLANT
KIT TURNOVER KIT A (KITS) IMPLANT
MARKER SKIN DUAL TIP RULER LAB (MISCELLANEOUS) ×2 IMPLANT
MESH PHASIX RESORB RECT 10X15 (Mesh General) ×1 IMPLANT
MESH VENTRALIGHT ST 8X10 (Mesh General) ×1 IMPLANT
NDL SPNL 22GX3.5 QUINCKE BK (NEEDLE) IMPLANT
NEEDLE SPNL 22GX3.5 QUINCKE BK (NEEDLE) IMPLANT
PAD POSITIONING PINK XL (MISCELLANEOUS) ×2 IMPLANT
PENCIL SMOKE EVACUATOR (MISCELLANEOUS) IMPLANT
SCISSORS LAP 5X35 DISP (ENDOMECHANICALS) ×2 IMPLANT
SET TUBE SMOKE EVAC HIGH FLOW (TUBING) ×2 IMPLANT
SLEEVE ADV FIXATION 5X100MM (TROCAR) ×2 IMPLANT
SPIKE FLUID TRANSFER (MISCELLANEOUS) ×2 IMPLANT
SPONGE GAUZE 2X2 STER 10/PKG (GAUZE/BANDAGES/DRESSINGS) ×1
STRIP CLOSURE SKIN 1/2X4 (GAUZE/BANDAGES/DRESSINGS) ×3 IMPLANT
SUT MNCRL AB 4-0 PS2 18 (SUTURE) ×2 IMPLANT
SUT PDS AB 1 CT1 27 (SUTURE) ×9 IMPLANT
SUT PROLENE 1 CT 1 30 (SUTURE) ×11 IMPLANT
TOWEL OR 17X26 10 PK STRL BLUE (TOWEL DISPOSABLE) ×2 IMPLANT
TRAY LAPAROSCOPIC (CUSTOM PROCEDURE TRAY) ×2 IMPLANT
TROCAR 11X100 Z THREAD (TROCAR) IMPLANT
TROCAR ADV FIXATION 5X100MM (TROCAR) ×2 IMPLANT
TROCAR Z-THREAD OPTICAL 5X100M (TROCAR) ×2 IMPLANT

## 2021-08-13 NOTE — Op Note (Signed)
08/13/2021  PATIENT:  Joanna Yang  51 y.o. female  Patient Care Team: Chesley Noon, MD as PCP - General (Family Medicine) Jettie Booze, MD as PCP - Cardiology (Cardiology) Yisroel Ramming, Everardo All, MD as Consulting Physician (Obstetrics and Gynecology) Leighton Ruff, MD as Consulting Physician (Colon and Rectal Surgery) Michael Boston, MD as Consulting Physician (General Surgery)  PRE-OPERATIVE DIAGNOSIS:  PARASTOMAL HERNIA AROUND COLOSTOMY  POST-OPERATIVE DIAGNOSIS:  PARASTOMAL HERNIA AROUND COLOSTOMY  PROCEDURE:   LAPAROSCOPIC REPAIR OF PARASTOMAL HERNIA WITH MESH  (See OR Findings below) LAPAROSCOPIC LYSIS OF ADHESIONS TAP BLOCK - BILATERAL  SURGEON:  Adin Hector, MD  ASSISTANT: Leighton Ruff, MD, FACS, FASCRS  An experienced assistant was required given the standard of surgical care given the complexity of the case.  This assistant was needed for exposure, dissection, suction, tissue approximation, retraction, perception, etc.   ANESTHESIA:     General  Regional TRANSVERSUS ABDOMINIS PLANE (TAP) nerve block for perioperative & postoperative pain control provided with liposomal bupivacaine (Experel) mixed with 0.25% bupivacaine as a Bilateral TAP block x 31m each side at the level of the transverse abdominis & preperitoneal spaces along the flank at the anterior axillary line, from subcostal ridge to iliac crest under laparoscopic guidance   EBL:  Total I/O In: 700 [I.V.:700] Out: 500 [Urine:400; Blood:100]  Per anesthesia record  Delay start of Pharmacological VTE agent (>24hrs) due to surgical blood loss or risk of bleeding:  no  DRAINS: none   SPECIMEN:  No Specimen  DISPOSITION OF SPECIMEN:  N/A  COUNTS:  YES  PLAN OF CARE: Admit for overnight observation  PATIENT DISPOSITION:  PACU - hemodynamically stable.  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 Recommendation  was made for surgical repair:  The anatomy & physiology of the abdominal wall was discussed. The pathophysiology of hernias was discussed. Natural history risks without surgery including progeressive enlargement, pain, incarceration & strangulation was discussed. Contributors to complications such as smoking, obesity, diabetes, prior surgery, etc were discussed.  I feel the risks of no intervention will lead to serious problems that outweigh the operative risks; therefore, I recommended surgery to reduce and repair the hernia. I explained laparoscopic techniques with possible need for an open approach. I noted the probable use of mesh to patch and/or buttress the hernia repair.  Risks such as bleeding, infection, abscess, need for further treatment, heart attack, death, and other risks were discussed. I noted a good likelihood this will help address the problem. Goals of post-operative recovery were discussed as well. Possibility that this will not correct all symptoms was explained. I stressed the importance of low-impact activity, aggressive pain control, avoiding constipation, & not pushing through pain to minimize risk of post-operative chronic pain or injury. Possibility of reherniation especially with smoking, obesity, diabetes, immunosuppression, and other health conditions was discussed. We will work to minimize complications.   An educational handout further explaining the pathology & treatment options was given as well. Questions were answered. The patient expresses understanding & wishes to proceed with surgery.   OR FINDINGS:   Hernia location:  Parastomal around LUQ colostomy  Hernia type:  Parastomal  - Reducible  Hernia size - largest dimension 8cm x 8cm  Type of repair: Laparoscopic underlay repair.  Primary repair of parastomal hernia   Placement of mesh: Intraperitoneal underlay repair  Name of mesh:  IN CONTACT WITH COLON:  Phasix Mesh (a knitted monofilament mesh scaffold  using Poly-4-hydroxybutyrate (P4HB), a biologically derived, fully resorbable material) Size of mesh: 15x10cm Orientation:  oblique  BETWEEN PHASIX MESH & VISCERAL PERITONEUM:   Ventralight  25x20cm  oblique  FINAL Mesh overlap: 7cm    DESCRIPTION:   Informed consent was confirmed.  The patient underwent general anaesthesia without difficulty.  The patient was positioned appropriately.  VTE prevention in place.  Colostomy plan remove in Prolene pursestring stitch placed around the edges to help close it down during surgery.  The patient's abdomen was clipped, prepped, & draped in a sterile fashion.  Ioban placed over the abdomen to help cover the colostomy protect the skin.  Surgical timeout confirmed our plan.  Peritoneal entry with a laparoscopic port was obtained using Varess spring needle entry technique in the left upper abdomen as the patient was positioned in reverse Trendelenburg.  I induced carbon dioxide insufflation.  No change in end tidal CO2 measurements.  Full symmetrical abdominal distention.  Initial port was carefully placed.  Camera inspection revealed no injury.  Extra ports were carefully placed under direct laparoscopic visualization.   I could see adhesions on the parietal peritoneum under the abdominal wall.  I did laparoscopic lysis of adhesions to expose the entire anterior abdominal wall.  I primarily used focused sharp dissection.  I freed the greater omentum off the midline adhesions that were contiguous with the falciform ligament.  Came to an obvious parastomal hernia in the left upper quadrant about 8 x 8 cm.  Nothing incarcerated within it.  Freed off omental adhesions to the paracolostomy hernia as well as adhesions of the colon going up into the parastomal hernia inferiorly.  I freed off the falciform ligament and central peritoneum to expose the retrorectus fascia   found the mid descending colon Hartman and with Prolene suture in place.  Of note patient's  abdomen was quite floppy and the ileocecal region was actually up in the left upper quadrant which had been noted in CAT scans before without any duodenal malnutrition.  I ran from the ileocecal region to the jejunum to try and get the cecum to fall down back into the right lower quadrant.  I made sure hemostasis was good.    It seemed that patient would benefit from some primary closure of the parastomal hernia.  We did that with #1 PDS interrupted suture using a laparoscopic suture passer.  We closed it obliquely from the left inferior aspect cephalad.  That seemed to provide things best without tension.  Did 3 interrupted sutures.  That closed the parastomal hernia down well.  However I was highly skeptical primary care will be adequate and Dr. Marcello Moores agreed.    Therefore proceeded with a Sugarbaker technique.  Because this was a distal transverse: Colostomy, mesentery was more towards the midline.  Closure was somewhat oblique.  Therefore decided to laser the meshes obliquely from the left hip towards the right upper quadrant.  To rent mesh erosion on the colon I used a phasix's mesh 15 x 10 cm.  I placed stitches around the edges and sides x6.  Create a fascial defect supraumbilically at the old midline closure and placed the mesh in and rolled it.  I had it laying deep to the transverse colostomy.  I ended up transecting some greater omentum off the distal and mid transverse colon to get it out of the way.  I then used a suture passer to patch up all 6 stitches to help Hammack the distal transverse colon.  I  made sure the phasix mesh overlapped inferolaterally.  Brought sutures up so that it was snug but not obstructing.  Dr. Marcello Moores agreed.  Tied those down.  To ensure more definitive long-term repair and the fact that this is a permanent colostomy, side to use permanent mesh now that the colon was protected with a phasix mesh.  To ensure that I would have adequate radial coverage outside of the hernia  defect, I chose a 25x20cm dual sided mesh.  I placed #1 Prolene stitches around its edge about every 5 cm = 12 total.  I rolled the mesh & placed into the peritoneal cavity through the midline fascia defect.  I unrolled the mesh and positioned it appropriately.  I secured the mesh to cover up the hernia defect using a laparoscopic suture passer to pass the tails of the Prolene through the abdominal wall & tagged them with clamps for good transfascial suturing.  I started out in four corners to make sure I had the mesh centered under the hernia defect appropriately, and then proceeded to work in quadrants.  Had it oriented obliquely such that the mesh laid from the left anterior superior iliac spine across midline to the epigastric region.  This provided good overlap.  This also came across the supraumbilical fascial defect that I had needed great to have the meshes go through.  We evacuated CO2 & desufflated the abdomen.  I tied the fascial stitches down. I closed the fascial defect that I placed the mesh through using #1 PDS interrupted transverse stitches primarily.  I reinsufflated the abdomen. The mesh provided at least circumferential coverage around the entire region of hernia defects.  I secured the mesh centrally with an additional trans fascial stitch in & out the mesh using #1 PDS under laparoscopic visualization to primarily closed the supraumbilical fascial defect where I had placed the meshes..   I tacked the edges & central part of the mesh to the peritoneum/posterior rectus fascia with SecureStrap absorbable tacks.   I did reinspection. Hemostasis was good. Mesh laid well. I completed a broad field block of local anesthesia at fascial stitch sites & fascial closure areas.    I ran the bowel from the ileocecal valve to the proximal jejunum encouraging the ileocecal region to flop back down in the right lower quadrant by having the patient be position reversed Trendelenburg had a positioning.   There is no twisting or torsion or volvulus station.  We held off on doing any cecopexy since there is no great data for that.  Do not feel is appropriate doing appendectomy in the setting of mesh repair  Capnoperitoneum was evacuated. Ports were removed. The skin was closed with Monocryl at the port sites and Steri-Strips on the fascial stitch puncture sites.  I used Dermabond around the more central fascial stitches around where the colostomy was to help protect those sites patient is being extubated to go to the recovery room.  I discussed operative findings, updated the patient's status, discussed probable steps to recovery, and gave postoperative recommendations to the patient's spouse, Dellis Filbert.  Recommendations were made.  Questions were answered.  He expressed understanding & appreciation.  Adin Hector, M.D., F.A.C.S. Gastrointestinal and Minimally Invasive Surgery [facility] [address], [place], [place] [address] [phone] Main / Paging  08/13/2021 11:27 AM

## 2021-08-13 NOTE — Plan of Care (Signed)

## 2021-08-13 NOTE — Anesthesia Procedure Notes (Signed)
Procedure Name: Intubation Date/Time: 08/13/2021 9:02 AM  Performed by: Lavina Hamman, CRNAPre-anesthesia Checklist: Patient identified, Emergency Drugs available, Suction available and Patient being monitored Patient Re-evaluated:Patient Re-evaluated prior to induction Oxygen Delivery Method: Circle System Utilized Preoxygenation: Pre-oxygenation with 100% oxygen Induction Type: IV induction Ventilation: Mask ventilation without difficulty Laryngoscope Size: Mac and 3 Grade View: Grade II Tube type: Oral Number of attempts: 1 Airway Equipment and Method: Stylet and Oral airway Placement Confirmation: ETT inserted through vocal cords under direct vision, positive ETCO2 and breath sounds checked- equal and bilateral Secured at: 21 cm Tube secured with: Tape Dental Injury: Teeth and Oropharynx as per pre-operative assessment  Comments: ATOI

## 2021-08-13 NOTE — Anesthesia Postprocedure Evaluation (Signed)
Anesthesia Post Note  Patient: Joanna Yang  Procedure(s) Performed: LAPAROSCOPIC PRIMARY PARASTOMAL HERNIA REPAIR WITH MESH AND BILATERAL TAP BLOCK LYSIS OF ADHESION     Patient location during evaluation: PACU Anesthesia Type: General Level of consciousness: awake and alert and oriented Pain management: pain level controlled Vital Signs Assessment: post-procedure vital signs reviewed and stable Respiratory status: spontaneous breathing, nonlabored ventilation and respiratory function stable Cardiovascular status: blood pressure returned to baseline and stable Postop Assessment: no apparent nausea or vomiting Anesthetic complications: no   No notable events documented.  Last Vitals:  Vitals:   08/13/21 1215 08/13/21 1230  BP: 103/75 96/64  Pulse: 88 83  Resp: 17 (!) 7  Temp:  (!) 36.4 C  SpO2: 96% 95%    Last Pain:  Vitals:   08/13/21 1230  TempSrc:   PainSc: 6                  Burl Tauzin A.

## 2021-08-13 NOTE — Transfer of Care (Signed)
Immediate Anesthesia Transfer of Care Note  Patient: Joanna Yang  Procedure(s) Performed: Procedure(s): LAPAROSCOPIC PRIMARY PARASTOMAL HERNIA REPAIR WITH MESH AND BILATERAL TAP BLOCK (N/A) LYSIS OF ADHESION (N/A)  Patient Location: PACU  Anesthesia Type:General  Level of Consciousness:  sedated, patient cooperative and responds to stimulation  Airway & Oxygen Therapy:Patient Spontanous Breathing and Patient connected to face mask oxgen  Post-op Assessment:  Report given to PACU RN and Post -op Vital signs reviewed and stable  Post vital signs:  Reviewed and stable  Last Vitals:  Vitals:   08/13/21 1143 08/13/21 1145  BP: 101/68 98/64  Pulse: 80 80  Resp: 12 10  Temp:  36.4 C  SpO2: 97% 58%    Complications: No apparent anesthesia complications

## 2021-08-13 NOTE — Interval H&P Note (Signed)
History and Physical Interval Note:  08/13/2021 7:03 AM  Joanna Yang  has presented today for surgery, with the diagnosis of PARASTOMAL HERNIA AROUND COLOSTOMY.  The various methods of treatment have been discussed with the patient and family. After consideration of risks, benefits and other options for treatment, the patient has consented to  Procedure(s): LAPAROSCOPIC PARASTOMAL HERNIA REPAIR WITH MESH (N/A) LYSIS OF ADHESION (N/A) as a surgical intervention.  The patient's history has been reviewed, patient examined, no change in status, stable for surgery.  I have reviewed the patiet's chart and labs.  Questions were answered to the patient's satisfaction.    Parstomal hernia stable  I have re-reviewed the the patient's records, history, medications, and allergies.  I have re-examined the patient.  I again discussed intraoperative plans and goals of post-operative recovery.  The patient agrees to proceed.  Joanna Yang  Jun 24, 1970 [id number]  Patient Care Team: Chesley Noon, MD as PCP - General (Family Medicine) Jettie Booze, MD as PCP - Cardiology (Cardiology) Nunzio Cobbs, MD as Consulting Physician (Obstetrics and Gynecology)  Patient Active Problem List   Diagnosis Date Noted   Internal hernia 12/21/2019   Colonic obstruction ([place]) 12/21/2019   Status post surgery 12/18/2019   Status post laparoscopic hysterectomy 12/18/2019   Genetic testing 10/07/2019   Family history of breast cancer    Discomfort in chest 10/29/2016   Chronic fatigue 10/29/2016   Allergic rhinitis due to allergen 10/29/2016   Vitamin D deficiency 10/29/2016    Past Medical History:  Diagnosis Date   Anemia    Anxiety    Complication of anesthesia    Endometrial polyp 03/22/2019   Family history of breast cancer    Fibroid    GERD (gastroesophageal reflux disease)    Heart murmur    pt. thinks she has a benign murmur dx'd during pregnancy   Mitral regurgitation    a.  Prior 2D echo in 2014 showed technically limited study, EF 60%, mild MR, trace TR.   Pneumonia    PONV (postoperative nausea and vomiting)    Right ovarian cyst 07/29/2019   Sleep apnea    Uses a mouth guard    Past Surgical History:  Procedure Laterality Date   ABDOMINAL HYSTERECTOMY     ANAL RECTAL MANOMETRY N/A 04/19/2020   Procedure: ANO RECTAL MANOMETRY;  Surgeon: Leighton Ruff, MD;  Location: [facility];  Service: Endoscopy;  Laterality: N/A;   CESAREAN SECTION  2009   COLECTOMY     COLONOSCOPY     COLOSTOMY     CYSTOSCOPY N/A 12/18/2019   Procedure: CYSTOSCOPY;  Surgeon: Nunzio Cobbs, MD;  Location: [facility];  Service: Gynecology;  Laterality: N/A;   ESOPHAGOGASTRODUODENOSCOPY ENDOSCOPY     IUD REMOVAL  07/29/2019   expelling IUD   LAPAROTOMY N/A 12/21/2019   Procedure: EXPLORATORY LAPAROTOMY; RESECTION OF DESCENDING AND SIGMOID COLON AND TRANSVERSE COLOSTOMY;  Surgeon: Armandina Gemma, MD;  Location: [facility];  Service: General;  Laterality: N/A;   TOTAL LAPAROSCOPIC HYSTERECTOMY WITH SALPINGECTOMY N/A 12/18/2019   Procedure: TOTAL LAPAROSCOPIC HYSTERECTOMY WITH SALPINGECTOMY and lysis of adhesions;  Surgeon: Nunzio Cobbs, MD;  Location: [facility];  Service: Gynecology;  Laterality: N/A;   TUBAL LIGATION  2009    Social History   Socioeconomic History   Marital status: Married    Spouse name: Not on file   Number of children: Not on file   Years  of education: Not on file   Highest education level: Not on file  Occupational History   Occupation: housewife  Tobacco Use   Smoking status: Never   Smokeless tobacco: Never  Vaping Use   Vaping Use: Never used  Substance and Sexual Activity   Alcohol use: No    Alcohol/week: 0.0 standard drinks of alcohol   Drug use: No   Sexual activity: Yes    Partners: Male    Birth control/protection: Surgical    Comment: Tubal/Hyst  Other Topics Concern   Not on file   Social History Narrative   Lives with husband and children.    Housewife.    Social Determinants of Health   Financial Resource Strain: Not on file  Food Insecurity: Not on file  Transportation Needs: Not on file  Physical Activity: Not on file  Stress: Not on file  Social Connections: Not on file  Intimate Partner Violence: Not on file    Family History  Problem Relation Age of Onset   Hypertension Mother    Arrhythmia Mother    Depression Mother    Heart disease Mother    Heart failure Mother    Diabetes Mother    Hypertension Father    Breast cancer Sister 78   Cancer Paternal Grandmother        tumor in spine   Dementia Paternal Grandfather     Medications Prior to Admission  Medication Sig Dispense Refill Last Dose   acetaminophen (TYLENOL) 325 MG tablet Take 650 mg by mouth every 6 (six) hours as needed for mild pain, fever or headache.      clonazePAM (KLONOPIN) 0.5 MG tablet Take 0.5 mg by mouth daily as needed for anxiety.      fexofenadine (ALLEGRA) 180 MG tablet Take 180 mg by mouth daily.      FLUoxetine (PROZAC) 20 MG capsule Take 40 mg by mouth daily.      fluticasone (FLONASE) 50 MCG/ACT nasal spray Place 1 spray into both nostrils daily as needed for allergies or rhinitis.      Melatonin 5 MG CAPS Take 5 mg by mouth at bedtime as needed (sleep).      rosuvastatin (CRESTOR) 10 MG tablet Take 10 mg by mouth at bedtime.      metoprolol tartrate (LOPRESSOR) 50 MG tablet TAKE 1 TABLET BY MOUTH 2 HOURS PRIOR TO YOUR CT (Patient not taking: Reported on 07/29/2021) 1 tablet 0 Not Taking   simethicone (MYLICON) 80 MG chewable tablet Chew 1 tablet (80 mg total) by mouth 4 (four) times daily as needed for flatulence. 30 tablet 0     Current Facility-Administered Medications  Medication Dose Route Frequency Provider Last Rate Last Admin   acetaminophen (TYLENOL) tablet 1,000 mg  1,000 mg Oral On Call to OR Michael Boston, MD       aprepitant (EMEND) capsule 40 mg  40  mg Oral Once Josephine Igo, MD       bupivacaine liposome (EXPAREL) 1.3 % injection 266 mg  20 mL Infiltration Once Michael Boston, MD       ceFAZolin (ANCEF) IVPB 2g/100 mL premix  2 g Intravenous On Call to OR Michael Boston, MD       And   metroNIDAZOLE (FLAGYL) IVPB 500 mg  500 mg Intravenous On Call to OR Michael Boston, MD       chlorhexidine (PERIDEX) 0.12 % solution 15 mL  15 mL Mouth/Throat Once Roderic Palau, MD  Or   Oral care mouth rinse  15 mL Mouth Rinse Once Roderic Palau, MD       Chlorhexidine Gluconate Cloth 2 % PADS 6 each  6 each Topical Once Michael Boston, MD       And   Chlorhexidine Gluconate Cloth 2 % PADS 6 each  6 each Topical Once Michael Boston, MD       Derrill Memo ON 08/14/2021] feeding supplement (ENSURE PRE-SURGERY) liquid 296 mL  296 mL Oral Once Michael Boston, MD       feeding supplement (ENSURE PRE-SURGERY) liquid 592 mL  592 mL Oral Once Michael Boston, MD       gabapentin (NEURONTIN) capsule 300 mg  300 mg Oral On Call to OR Michael Boston, MD       lactated ringers infusion   Intravenous Continuous Roderic Palau, MD       scopolamine (TRANSDERM-SCOP) 1 MG/3DAYS 1.5 mg  1 patch Transdermal Q72H Josephine Igo, MD         Allergies  Allergen Reactions   Sulfamethoxazole-Trimethoprim Swelling   Sulfur     Other reaction(s): Unknown    BP 126/82   Pulse 82   Temp 98.2 F (36.8 C) (Oral)   Resp 15   LMP 07/28/2019   SpO2 99%   Labs: No results found for this or any previous visit (from the past 48 hour(s)).  Imaging / Studies: No results found.   Adin Hector, M.D., F.A.C.S. Gastrointestinal and Minimally Invasive Surgery [facility] [address], [place], [place] [address] [phone] Main / Paging  08/13/2021 7:03 AM     Adin Hector

## 2021-08-13 NOTE — Discharge Instructions (Signed)
[facility] REPAIR: POST OP INSTRUCTIONS  ######################################################################  EAT Gradually transition to a high fiber diet with a fiber supplement over the next few weeks after discharge.  Start with a pureed / full liquid diet (see below)  WALK Walk an hour a day.  Control your pain to do that.    CONTROL PAIN Control pain so that you can walk, sleep, tolerate sneezing/coughing, and go up/down stairs.  HAVE A BOWEL MOVEMENT DAILY Keep your bowels regular to avoid problems.  OK to try a laxative to override constipation.  OK to use an antidairrheal to slow down diarrhea.  Call if not better after 2 tries  CALL IF YOU HAVE PROBLEMS/CONCERNS Call if you are still struggling despite following these instructions. Call if you have concerns not answered by these instructions  ######################################################################    DIET: Follow a light bland diet & liquids the first 24 hours after arrival home, such as soup, liquids, starches, etc.  Be sure to drink plenty of fluids.  Quickly advance to a usual solid diet within a few days.  Avoid fast food or heavy meals as your are more likely to get nauseated or have irregular bowels.  A low-fat, high-fiber diet for the rest of your life is ideal.   Take your usually prescribed home medications unless otherwise directed.  PAIN CONTROL: Pain is best controlled by a usual combination of three different methods TOGETHER: Ice/Heat Over the counter pain medication Prescription pain medication Most patients will experience some swelling and bruising around the hernia(s) such as the bellybutton, groins, or old incisions.  Ice packs or heating pads (30-60 minutes up to 6 times a day) will help. Use ice for the first few days to help decrease swelling and bruising, then switch to heat to help relax tight/sore spots and speed recovery.  Some people prefer to use ice alone, heat alone, alternating  between ice & heat.  Experiment to what works for you.  Swelling and bruising can take several weeks to resolve.   It is helpful to take an over-the-counter pain medication regularly for the first few weeks.  Choose one of the following that works best for you: Naproxen (Aleve, etc)  Two 244m tabs twice a day Ibuprofen (Advil, etc) Three 2045mtabs four times a day (every meal & bedtime) Acetaminophen (Tylenol, etc) 325-65043mour times a day (every meal & bedtime) A  prescription for pain medication should be given to you upon discharge.  Take your pain medication as prescribed.  If you are having problems/concerns with the prescription medicine (does not control pain, nausea, vomiting, rash, itching, etc), please call us [phone] see if we need to switch you to a different pain medicine that will work better for you and/or control your side effect better. If you need a refill on your pain medication, please contact your pharmacy.  They will contact our office to request authorization. Prescriptions will not be filled after 5 pm or on week-ends.  Avoid getting constipated.  Between the surgery and the pain medications, it is common to experience some constipation.  Increasing fluid intake and taking a fiber supplement (such as Metamucil, Citrucel, FiberCon, MiraLax, etc) 1-2 times a day regularly will usually help prevent this problem from occurring.  A mild laxative (prune juice, Milk of Magnesia, MiraLax, etc) should be taken according to package directions if there are no bowel movements after 48 hours.    Wash / shower every day.  You may shower over the dressings  as they are waterproof.    It is good for closed incisions and even open wounds to be washed every day.  Shower every day.  Short baths are fine.  Wash the incisions and wounds clean with soap & water.    You may leave closed incisions open to air if it is dry.   You may cover the incision with clean gauze & replace it after  your daily shower for comfort.  TEGADERM & STERISTRIPS:  You have clear gauze band-aid dressings over your closed incision(s).  You also have skin tapes called Steristrips on your incisions.  Leave these in place.  You may trim any edges that curl up with clean scissors.  You may remove all dressings & tapes in the shower in a week.    ACTIVITIES as tolerated:   You may resume regular (light) daily activities beginning the next day--such as daily self-care, walking, climbing stairs--gradually increasing activities as tolerated.  Control your pain so that you can walk an hour a day.  If you can walk 30 minutes without difficulty, it is safe to try more intense activity such as jogging, treadmill, bicycling, low-impact aerobics, swimming, etc. Save the most intensive and strenuous activity for last such as sit-ups, heavy lifting, contact sports, etc  Refrain from any heavy lifting or straining until you are off narcotics for pain control.   DO NOT PUSH THROUGH PAIN.  Let pain be your guide: If it hurts to do something, don't do it.  Pain is your body warning you to avoid that activity for another week until the pain goes down. You may drive when you are no longer taking prescription pain medication, you can comfortably wear a seatbelt, and you can safely maneuver your car and apply brakes. You may have sexual intercourse when it is comfortable.   FOLLOW UP in our office Please call [facility] at [phone] to set up an appointment to see your surgeon in the office for a follow-up appointment approximately 2-3 weeks after your surgery. Make sure that you call for this appointment the day you arrive home to insure a convenient appointment time.  9.  If you have disability of FMLA / Family leave forms, please bring the forms to the office for processing.  (do not give to your surgeon).  WHEN TO CALL us [phone]: Poor pain control Reactions / problems with new medications (rash/itching, nausea,  etc)  Fever over 101.5 F (38.5 C) Inability to urinate Nausea and/or vomiting Worsening swelling or bruising Continued bleeding from incision. Increased pain, redness, or drainage from the incision   The clinic staff is available to answer your questions during regular business hours (8:30am-5pm).  Please don't hesitate to call and ask to speak to one of our nurses for clinical concerns.   If you have a medical emergency, go to the nearest emergency room or call 911.  A surgeon from [facility] is always on call at the hospitals in [facility], [place], [place], [place], [place]  [address] ?  [address] [address], [place], [place]   [address] MAIN: [phone] ? TOLL FREE: [phone] ? FAX: [phone] [email or web address]    ###############  #######################################################  Ostomy Support Information  You've heard that people get along just fine with only one of their eyes, or one of their lungs, or one of their kidneys. But you also know that you have only one intestine and only one bladder, and that  leaves you feeling awfully empty, both physically and emotionally: You think no other people go around without part of their intestine with the ends of their intestines sticking out through their abdominal walls.   YOU ARE NOT ALONE.  There are nearly three quarters of a million people in the [place] who have an ostomy; people who have had surgery to remove all or part of their colons or bladders.   There is even a national association, the [place] [employer] [place] with over 350 local affiliated support groups that are organized by volunteers who provide peer support and counseling. [place] has a toll free telephone num-ber, [phone] and an educational, interactive website, www.ostomy.org   An ostomy is an opening in the belly (abdominal wall) made by surgery. Ostomates are people who have had  this procedure. The opening (stoma) allows the kidney or bowel to grdischarge waste. An external pouch covers the stoma to collect waste. Pouches are are a simple bag and are odor free. Different companies have disposable or reusable pouches to fit one's lifestyle. An ostomy can either be temporary or permanent.   THERE ARE THREE MAIN TYPES OF OSTOMIES Colostomy. A colostomy is a surgically created opening in the large intestine (colon). Ileostomy. An ileostomy is a surgically created opening in the small intestine. Urostomy. A urostomy is a surgically created opening to divert urine away from the bladder.  OSTOMY Care  The following guidelines will make care of your colostomy easier. Keep this information close by for quick reference.  Helpful DIET hints Eat a well-balanced diet including vegetables and fresh fruits. Eat on a regular schedule.  Drink at least 6 to 8 glasses of fluids daily. Eat slowly in a relaxed atmosphere. Chew your food thoroughly. Avoid chewing gum, smoking, and drinking from a straw. This will help decrease the amount of air you swallow, which may help reduce gas. Eating yogurt or drinking buttermilk may help reduce gas.  To control gas at night, do not eat after 8 p.m. This will give your bowel time to quiet down before you go to bed.  If gas is a problem, you can purchase Beano. Sprinkle Beano on the first bite of food before eating to reduce gas. It has no flavor and should not change the taste of your food. You can buy Beano over the counter at your local drugstore.  Foods like fish, onions, garlic, broccoli, asparagus, and cabbage produce odor. Although your pouch is odor-proof, if you eat these foods you may notice a stronger odor when emptying your pouch. If this is a concern, you may want to limit these foods in your diet.  If you have an ileostomy, you will have chronic diarrhea & need to drink more liquids to avoid getting dehydrated.  Consider antidiarrheal  medicine like imodium (loperamide) or Lomotil to help slow down bowel movements / diarrhea into your ileostomy bag.  GETTING TO GOOD BOWEL HEALTH WITH AN ILEOSTOMY    With the colon bypassed & not in use, you will have small bowel diarrhea.   It is important to thicken & slow your bowel movements down.   The goal: 4-6 small BOWEL MOVEMENTS A DAY It is important to drink plenty of liquids to avoid getting dehydrated  CONTROLLING ILEOSTOMY DIARRHEA  TAKE A FIBER SUPPLEMENT (FiberCon or Benefiner soluble fiber) twice a day - to thicken stools by absorbing excess fluid and retrain the intestines to act more normally.  Slowly increase the dose over a few weeks.  Too much fiber too soon can backfire and cause cramping & bloating.  TAKE AN IRON SUPPLEMENT twice a day to naturally constipate your bowels.  Usually ferrous sulfate 384m twice a day)  TAKE ANTI-DIARRHEAL MEDICINES: Loperamide (Imodium) can slow down diarrhea.  Start with two tablets (= 443m first and then try one tablet every 6 hours.  Can go up to 2 pills four times day (8 pills of 84m6max) Avoid if you are having fevers or severe pain.  If you are not better or start feeling worse, stop all medicines and call your doctor for advice LoMotil (Diphenoxylate / Atropine) is another medicine that can constipate & slow down bowel moevements Pepto Bismol (bismuth) can gently thicken bowels as well  If diarrhea is worse,: drink plenty of liquids and try simpler foods for a few days to avoid stressing your intestines further. Avoid dairy products (especially milk & ice cream) for a short time.  The intestines often can lose the ability to digest lactose when stressed. Avoid foods that cause gassiness or bloating.  Typical foods include beans and other legumes, cabbage, broccoli, and dairy foods.  Every person has some sensitivity to other foods, so listen to our body and avoid those foods that trigger problems for you.Call your doctor if you  are getting worse or not better.  Sometimes further testing (cultures, endoscopy, X-ray studies, bloodwork, etc) may be needed to help diagnose and treat the cause of the diarrhea. Take extra anti-diarrheal medicines (maximum is 8 pills of 84mg33mperamide a day)   Tips for POUCHING an OSTOMY   Changing Your Pouch The best time to change your pouch is in the morning, before eating or drinking anything. Your stoma can function at any time, but it will function more after eating or drinking.   Applying the pouching system  Place all your equipment close at hand before removing your pouch.  Wash your hands.  Stand or sit in front of a mirror. Use the position that works best for you. Remember that you must keep the skin around the stoma wrinkle-free for a good seal.  Gently remove the used pouch (1-piece system) or the pouch and old wafer (2-piece system). Empty the pouch into the toilet. Save the closure clip to use again.  Wash the stoma itself and the skin around the stoma. Your stoma may bleed a little when being washed. This is normal. Rinse and pat dry. You may use a wash cloth or soft paper towels (like Bounty), mild soap (like Dial, Safeguard, or IvorMongoliand water. Avoid soaps that contain perfumes or lotions.  For a new pouch (1-piece system) or a new wafer (2-piece system), measure your stoma using the stoma guide in each box of supplies.  Trace the shape of your stoma onto the back of the new pouch or the back of the new wafer. Cut out the opening. Remove the paper backing and set it aside.  Optional: Apply a skin barrier powder to surrounding skin if it is irritated (bare or weeping), and dust off the excess. Optional: Apply a skin-prep wipe (such as Skin Prep or All-Kare) to the skin around the stoma, and let it dry. Do not apply this solution if the skin is irritated (red, tender, or broken) or if you have shaved around the stoma. Optional: Apply a skin barrier paste (such as  Stomahesive, Coloplast, or Premium) around the opening cut in the back of the pouch or wafer. Allow it to dry for 30  to 60 seconds.  Hold the pouch (1-piece system) or wafer (2-piece system) with the sticky side toward your body. Make sure the skin around the stoma is wrinkle-free. Center the opening on the stoma, then press firmly to your abdomen (Fig. 4). Look in the mirror to check if you are placing the pouch, or wafer, in the right position. For a 2-piece system, snap the pouch onto the wafer. Make sure it snaps into place securely.  Place your hand over the stoma and the pouch or wafer for about 30 seconds. The heat from your hand can help the pouch or wafer stick to your skin.  Add deodorant (such as Super Banish or Nullo) to your pouch. Other options include food extracts such as vanilla oil and peppermint extract. Add about 10 drops of the deodorant to the pouch. Then apply the closure clamp. Note: Do not use toxic  chemicals or commercial cleaning agents in your pouch. These substances may harm the stoma.  Optional: For extra seal, apply tape to all 4 sides around the pouch or wafer, as if you were framing a picture. You may use any brand of medical adhesive tape. Change your pouch every 5 to 7 days. Change it immediately if a leak occurs.  Wash your hands afterwards.  If you are wearing a 2-piece system, you may use 2 new pouches per week and alternate them. Rinse the pouch with mild soap and warm water and hang it to dry for the next day. Apply the fresh pouch. Alternate the 2 pouches like this for a week. After a week, change the wafer and begin with 2 new pouches. Place the old pouches in a plastic bag, and put them in the trash.   LIVING WITH AN OSTOMY  Emptying Your Pouch Empty your pouch when it is one-third full (of urine, stool, and/or gas). If you wait until your pouch is fuller than this, it will be more difficult to empty and more noticeable. When you empty your pouch, either  put toilet paper in the toilet bowl first, or flush the toilet while you empty the pouch. This will reduce splashing. You can empty the pouch between your legs or to one side while sitting, or while standing or stooping. If you have a 2-piece system, you can snap off the pouch to empty it. Remember that your stoma may function during this time. If you wish to rinse your pouch after you empty it, a [place] baster can be helpful. When using a baster, squirt water up into the pouch through the opening at the bottom. With a 2-piece system, you can snap off the pouch to rinse it. After rinsing  your pouch, empty it into the toilet. When rinsing your pouch at home, put a few granules of Dreft soap in the rinse water. This helps lubricate and freshen your pouch. The inside of your pouch can be sprayed with non-stick cooking oil (Pam spray). This may help reduce stool sticking to the inside of the pouch.  Bathing You may shower or bathe with your pouch on or off. Remember that your stoma may function during this time.  The materials you use to wash your stoma and the skin around it should be clean, but they do not need to be sterile.  Wearing Your Pouch During hot weather, or if you perspire a lot in general, wear a cover over your pouch. This may prevent a rash on your skin under the pouch. Pouch covers are sold at  ostomy supply stores. Wear the pouch inside your underwear for better support. Watch your weight. Any gain or loss of 10 to 15 pounds or more can change the way your pouch fits.  Going Away From Home A collapsible cup (like those that come in travel kits) or a soft plastic squirt bottle with a pull-up top (like a travel bottle for shampoo) can be used for rinsing your pouch when you are away from home. Tilt the opening of the pouch at an upward angle when using a cup to rinse.  Carry wet wipes or extra tissues to use in public bathrooms.  Carry an extra pouching system with you at all  times.  Never keep ostomy supplies in the glove compartment of your car. Extreme heat or cold can damage the skin barriers and adhesive wafers on the pouch.  When you travel, carry your ostomy supplies with you at all times. Keep them within easy reach. Do not pack ostomy supplies in baggage that will be checked or otherwise separated from you, because your baggage might be lost. If you're traveling out of the country, it is helpful to have a letter stating that you are carrying ostomy supplies as a medical necessity.  If you need ostomy supplies while traveling, look in the yellow pages of the telephone book under "Surgical Supplies." Or call the local ostomy organization to find out where supplies are available.  Do not let your ostomy supplies get low. Always order new pouches before you use the last one.  Reducing Odor Limit foods such as broccoli, cabbage, onions, fish, and garlic in your diet to help reduce odor. Each time you empty your pouch, carefully clean the opening of the pouch, both inside and outside, with toilet paper. Rinse your pouch 1 or 2 times daily after you empty it (see directions for emptying your pouch and going away from home). Add deodorant (such as Super Banish or Nullo) to your pouch. Use air deodorizers in your bathroom. Do not add aspirin to your pouch. Even though aspirin can help prevent odor, it could cause ulcers on your stoma.  When to call the doctor Call the doctor if you have any of the following symptoms: Purple, black, or white stoma Severe cramps lasting more than 6 hours Severe watery discharge from the stoma lasting more than 6 hours No output from the colostomy for 3 days Excessive bleeding from your stoma Swelling of your stoma to more than 1/2-inch larger than usual Pulling inward of your stoma below skin level Severe skin irritation or deep ulcers Bulging or other changes in your abdomen  When to call your ostomy nurse Call your  ostomy/enterostomal therapy (WOCN) nurse if any of the following occurs: Frequent leaking of your pouching system Change in size or appearance of your stoma, causing discomfort or problems with your pouch Skin rash or rawness Weight gain or loss that causes problems with your pouch     FREQUENTLY ASKED QUESTIONS   Why haven't you met any of these folks who have an ostomy?  Well, maybe you have! You just did not recognize them because an ostomy doesn't show. It can be kept secret if you wish. Why, maybe some of your best friends, office associates or neighbors have an ostomy ... you never can tell. People facing ostomy surgery have many quality-of-life questions like: Will you bulge? Smell? Make noises? Will you feel waste leaving your body? Will you be a captive of the toilet? Will you starve? Be  a social outcast? Get/stay married? Have babies? Easily bathe, go swimming, bend over?  OK, let's look at what you can expect:   Will you bulge?  Remember, without part of the intestine or bladder, and its contents, you should have a flatter tummy than before. You can expect to wear, with little exception, what you wore before surgery ... and this in-cludes tight clothing and bathing suits.   Will you smell?  Today, thanks to modern odor proof pouching systems, you can walk into an ostomy support group meeting and not smell anything that is foul or offensive. And, for those with an ileostomy or colostomy who are concerned about odor when emptying their pouch, there are in-pouch deodorants that can be used to eliminate any waste odors that may exist.   Will you make noises?  Everyone produces gas, especially if they are an air-swallower. But intestinal sounds that occur from time to time are no differ-ent than a gurgling tummy, and quite often your clothing will muffle any sounds.   Will you feel the waste discharges?  For those with a colostomy or ileostomy there might be a slight pressure when  waste leaves your body, but understand that the intestines have no nerve endings, so there will be no unpleasant sensations. Those with a urostomy will probably be unaware of any kidney drainage.   Will you be a captive of the toilet?  Immediately post-op you will spend more time in the bathroom than you will after your body recovers from surgery. Every person is different, but on average those with an ileostomy or urostomy may empty their pouches 4 to 6 times a day; a little  less if you have a colostomy. The average wear time between pouch system changes is 3 to 5 days and the changing process should take less than 30 minutes.   Will I need to be on a special diet? Most people return to their normal diet when they have recovered from surgery. Be sure to chew your food well, eat a well-balanced diet and drink plenty of fluids. If you experience problems with a certain food, wait a couple of weeks and try it again.  Will there be odor and noises? Pouching systems are designed to be odor-proof or odor-resistant. There are deodorants that can be used in the pouch. Medications are also available to help reduce odor. Limit gas-producing foods and carbonated beverages. You will experience less gas and fewer noises as you heal from surgery.  How much time will it take to care for my ostomy? At first, you may spend a lot of time learning about your ostomy and how to take care of it. As you become more comfortable and skilled at changing the pouching system, it will take very little time to care for it.   Will I be able to return to work? People with ostomies can perform most jobs. As soon as you have healed from surgery, you should be able to return to work. Heavy lifting (more than 10 pounds) may be discouraged.   What about intimacy? Sexual relationships and intimacy are important and fulfilling aspects of your life. They should continue after ostomy surgery. Intimacy-related concerns should be  discussed openly between you and your partner.   Can I wear regular clothing? You do not need to wear special clothing. Ostomy pouches are fairly flat and barely noticeable. Elastic undergarments will not hurt the stoma or prevent the ostomy from functioning.   Can I participate in sports?  An ostomy should not limit your involvement in sports. Many people with ostomies are runners, skiers, swimmers or participate in other active lifestyles. Talk with your caregiver first before doing heavy physical activity.  Will you starve?  Not if you follow doctor's orders at each stage of your post-op adjustment. There is no such thing as an "ostomy diet". Some people with an ostomy will be able to eat and tolerate anything; others may find diffi-culty with some foods. Each person is an individual and must determine, by trial, what is best for them. A good practice for all is to drink plenty of water.   Will you be a social outcast?  Have you met anyone who has an ostomy and is a social outcast? Why should you be the first? Only your attitude and self image will effect how you are treated. No confi-dent person is an Occupational psychologist.    PROFESSIONAL HELP   Resources are available if you need help or have questions about your ostomy.   Specially trained nurses called Wound, Ostomy Continence Nurses (WOCN) are available for consultation in most major medical centers.  Consider getting an ostomy consult at an outpatient ostomy clinic.   [place] has an [facility] run by an Programmer, systems at the [facility].  [phone]. [facility] can help set up an appointment   The [employer] ([employer]OA) is a group made up of many local chapters throughout the [place]. These local groups hold meetings and provide support to prospective and existing ostomates. They sponsor educational events and have qualified visitors to make personal or telephone visits. Contact the UOA for  the chapter nearest you and for other educational publications.  More detailed information can be found in Colostomy Guide, a publication of the [employer] (UOA). Contact UOA at 1-252 311 0105 or visit their web site at https://arellano.com/. The website contains links to other sites, suppliers and resources.  Tree surgeon Start Services: Start at the website to enlist for support.  Your Wound Ostomy (WOCN) nurse may have started this process. https://www.hollister.com/en/securestart Secure Start services are designed to support people as they live their lives with an ostomy or neurogenic bladder. Enrolling is easy and at no cost to the patient. We realize that each person's needs and life journey are different. Through Secure Start services, we want to help people live their life, their way.  #######################################################

## 2021-08-14 ENCOUNTER — Encounter (HOSPITAL_COMMUNITY): Payer: Self-pay | Admitting: Surgery

## 2021-08-14 ENCOUNTER — Other Ambulatory Visit: Payer: Self-pay

## 2021-08-14 MED ORDER — HYDROMORPHONE HCL 1 MG/ML IJ SOLN
0.5000 mg | INTRAMUSCULAR | Status: DC | PRN
  Administered 2021-08-14 – 2021-08-15 (×3): 1 mg via INTRAVENOUS
  Filled 2021-08-14 (×3): qty 1

## 2021-08-14 MED ORDER — POLYETHYLENE GLYCOL 3350 17 G PO PACK
17.0000 g | PACK | Freq: Two times a day (BID) | ORAL | Status: DC
  Administered 2021-08-14 – 2021-08-17 (×6): 17 g via ORAL
  Filled 2021-08-14 (×6): qty 1

## 2021-08-14 MED ORDER — POLYETHYLENE GLYCOL 3350 17 G PO PACK: 17.0000 g | PACK | Freq: Two times a day (BID) | ORAL | Status: DC | PRN

## 2021-08-14 NOTE — Progress Notes (Signed)
Transition of Care (TOC) Screening Note  Patient Details  Name: Joanna Yang Date of Birth: 1970-06-02  Transition of Care [facility]) CM/SW Contact:    Sherie Don, LCSW Phone Number: 08/14/2021, 10:54 AM  Transition of Care Department [facility]) has reviewed patient and no TOC needs have been identified at this time. We will continue to monitor patient advancement through interdisciplinary progression rounds. If new patient transition needs arise, please place a TOC consult.

## 2021-08-14 NOTE — Progress Notes (Signed)
Joanna Yang [id number] August 07, 1970  CARE TEAM:  PCP: Chesley Noon, MD  Outpatient Care Team: Patient Care Team: Chesley Noon, MD as PCP - General (Family Medicine) Jettie Booze, MD as PCP - Cardiology (Cardiology) Yisroel Ramming, Everardo All, MD as Consulting Physician (Obstetrics and Gynecology) Leighton Ruff, MD as Consulting Physician (Colon and Rectal Surgery) Michael Boston, MD as Consulting Physician (General Surgery)  Inpatient Treatment Team: Treatment Team: Attending Provider: Michael Boston, MD; Registered Nurse: Orbie Pyo, RN; Registered Nurse: Jennye Boroughs, RN; Pharmacist: Eudelia Bunch, [facility]   Problem List:   Principal Problem:   Parastomal hernia without obstruction or gangrene Active Problems:   Colostomy present ([place])   GAD (generalized anxiety disorder)   Gastro-esophageal reflux disease without esophagitis   Parastomal hernia   1 Day Post-Op  08/13/2021  POST-OPERATIVE DIAGNOSIS:  PARASTOMAL HERNIA AROUND COLOSTOMY   PROCEDURE:   LAPAROSCOPIC REPAIR OF PARASTOMAL HERNIA WITH MESH  (See OR Findings below) LAPAROSCOPIC LYSIS OF ADHESIONS TAP BLOCK - BILATERAL   SURGEON:  Adin Hector, MD    Assessment  [facility] Stay = 0 days)  Plan:  -Pain control.  Monitor.  Ice/heat.  Standing Tylenol.  Breakthrough muscle laxer's and narcotics.  Try and mobilize -Advance diet.  Patient wants to stay on fulls for now.  Possibly solids later.  Moderate risk for ileus given mesh repair & history of constipation/defecation dysfunction.  We will see -Bowel regimen -Resume SSRI for baseline anxiety.  Seems stable. -GERD.  As needed control. -Follow blood pressure. -VTE prophylaxis- SCDs, etc -mobilize as tolerated to help recovery  Disposition:  Disposition:  The patient is from: Home  Anticipate discharge to:  Home  Anticipated Date of Discharge is:  June 23,2023    Barriers to discharge:  Pending Clinical  improvement (more likely than not)  Patient currently is NOT MEDICALLY STABLE for discharge from the hospital from a surgery standpoint.      I reviewed nursing notes, last 24 h vitals and pain scores, last 48 h intake and output, last 24 h labs and trends, and last 24 h imaging results. I have reviewed this patient's available data, including medical history, events of note, test results, etc as part of my evaluation.  A significant portion of that time was spent in counseling.  Care during the described time interval was provided by me.  This care required moderate level of medical decision making.  08/14/2021    Subjective: (Chief complaint)  Patient feeling rather sore.  Mother-in-law in room.  Nursing in room.  Tolerated liquids but hesitant to advance.    Objective:  Vital signs:  Vitals:   08/13/21 1900 08/13/21 2212 08/14/21 0210 08/14/21 0615  BP: 105/70 113/75 107/75 105/76  Pulse: 100 98 89 88  Resp: '16 18 16 18  '$ Temp: 98 F (36.7 C) 97.7 F (36.5 C) 97.8 F (36.6 C) 98.4 F (36.9 C)  TempSrc: Axillary Oral Oral Oral  SpO2: 97% 97% 99% 99%  Weight:      Height:        Last BM Date : 08/12/21  Intake/Output   Yesterday:  06/21 0701 - 06/22 0700 In: 2732.5 [P.O.:720; I.V.:2012.5] Out: 2025 [DXIPJ:8250; Blood:100] This shift:  No intake/output data recorded.  Bowel function:  Flatus: YES  BM:  No  Drain: (No drain)   Physical Exam:  General: Pt awake/alert in no acute distress Eyes: PERRL, normal EOM.  Sclera clear.  No icterus  Neuro: CN II-XII intact w/o focal sensory/motor deficits. Lymph: No head/neck/groin lymphadenopathy Psych:  No delerium/psychosis/paranoia.  Oriented x 4 HENT: Normocephalic, Mucus membranes moist.  No thrush Neck: Supple, No tracheal deviation.  No obvious thyromegaly Chest: No pain to chest wall compression.  Good respiratory excursion.  No audible wheezing CV:  Pulses intact.  Regular rhythm.  No major  extremity edema MS: Normal AROM mjr joints.  No obvious deformity  Abdomen: Soft.  Mildy distended.  Mildly tender at incisions only.  Colostomy pink.  Scant gas in bag.  No evidence of peritonitis.  No incarcerated hernias.  Ext:   No deformity.  No mjr edema.  No cyanosis Skin: No petechiae / purpurea.  No major sores.  Warm and dry    Results:   Cultures: No results found for this or any previous visit (from the past 720 hour(s)).  Labs: Results for orders placed or performed during the hospital encounter of 08/13/21 (from the past 48 hour(s))  Type and screen [place]     Status: None   Collection Time: 08/13/21  7:15 AM  Result Value Ref Range   ABO/RH(D) A POS    Antibody Screen NEG    Sample Expiration      08/16/2021,2359 Performed at [facility], [place] [address] [place], [place] [address]     Imaging / Studies: No results found.  Medications / Allergies: per chart  Antibiotics: Anti-infectives (From admission, onward)    Start     Dose/Rate Route Frequency Ordered Stop   08/13/21 2000  metroNIDAZOLE (FLAGYL) IVPB 500 mg        500 mg 100 mL/hr over 60 Minutes Intravenous Every 12 hours 08/13/21 1354 08/13/21 2157   08/13/21 1630  ceFAZolin (ANCEF) IVPB 2g/100 mL premix        2 g 200 mL/hr over 30 Minutes Intravenous Every 8 hours 08/13/21 1354 08/13/21 2050   08/13/21 0700  ceFAZolin (ANCEF) IVPB 2g/100 mL premix       See Hyperspace for full Linked Orders Report.   2 g 200 mL/hr over 30 Minutes Intravenous On call to O.R. 08/13/21 7341 08/13/21 0839   08/13/21 0700  metroNIDAZOLE (FLAGYL) IVPB 500 mg       See Hyperspace for full Linked Orders Report.   500 mg 100 mL/hr over 60 Minutes Intravenous On call to O.R. 08/13/21 9379 08/13/21 0850         Note: Portions of this report may have been transcribed using voice recognition software. Every effort was made to ensure accuracy; however, inadvertent  computerized transcription errors may be present.   Any transcriptional errors that result from this process are unintentional.    Adin Hector, MD, FACS, MASCRS Esophageal, Gastrointestinal & Colorectal Surgery Robotic and Minimally Invasive Surgery  [facility], [place]  [place]. [address], [place], [place] [address] [phone] Fax [phone] Main  CONTACT INFORMATION:  Weekday (9AM-5PM): Call [facility] main office at [phone]  Weeknight (5PM-9AM) or Weekend/Holiday: Check [email or web address] (password " [email or web address]") for General Surgery [facility] coverage  (Please, do not use SecureChat as it is not reliable communication to operating surgeons for immediate patient care)      08/14/2021  7:51 AM

## 2021-08-15 DIAGNOSIS — Z833 Family history of diabetes mellitus: Secondary | ICD-10-CM | POA: Diagnosis not present

## 2021-08-15 DIAGNOSIS — Z90710 Acquired absence of both cervix and uterus: Secondary | ICD-10-CM | POA: Diagnosis not present

## 2021-08-15 DIAGNOSIS — Z933 Colostomy status: Secondary | ICD-10-CM | POA: Diagnosis not present

## 2021-08-15 DIAGNOSIS — F411 Generalized anxiety disorder: Secondary | ICD-10-CM | POA: Diagnosis present

## 2021-08-15 DIAGNOSIS — K219 Gastro-esophageal reflux disease without esophagitis: Secondary | ICD-10-CM | POA: Diagnosis present

## 2021-08-15 DIAGNOSIS — Z818 Family history of other mental and behavioral disorders: Secondary | ICD-10-CM | POA: Diagnosis not present

## 2021-08-15 DIAGNOSIS — K648 Other hemorrhoids: Secondary | ICD-10-CM | POA: Diagnosis present

## 2021-08-15 DIAGNOSIS — Z87440 Personal history of urinary (tract) infections: Secondary | ICD-10-CM | POA: Diagnosis not present

## 2021-08-15 DIAGNOSIS — Z9049 Acquired absence of other specified parts of digestive tract: Secondary | ICD-10-CM | POA: Diagnosis not present

## 2021-08-15 DIAGNOSIS — K567 Ileus, unspecified: Secondary | ICD-10-CM | POA: Diagnosis not present

## 2021-08-15 DIAGNOSIS — K5909 Other constipation: Secondary | ICD-10-CM | POA: Diagnosis present

## 2021-08-15 DIAGNOSIS — K435 Parastomal hernia without obstruction or  gangrene: Secondary | ICD-10-CM | POA: Diagnosis present

## 2021-08-15 DIAGNOSIS — Z888 Allergy status to other drugs, medicaments and biological substances status: Secondary | ICD-10-CM | POA: Diagnosis not present

## 2021-08-15 DIAGNOSIS — Z79890 Hormone replacement therapy (postmenopausal): Secondary | ICD-10-CM | POA: Diagnosis not present

## 2021-08-15 DIAGNOSIS — G4730 Sleep apnea, unspecified: Secondary | ICD-10-CM | POA: Diagnosis present

## 2021-08-15 DIAGNOSIS — Z803 Family history of malignant neoplasm of breast: Secondary | ICD-10-CM | POA: Diagnosis not present

## 2021-08-15 DIAGNOSIS — Z79899 Other long term (current) drug therapy: Secondary | ICD-10-CM | POA: Diagnosis not present

## 2021-08-15 DIAGNOSIS — Z8249 Family history of ischemic heart disease and other diseases of the circulatory system: Secondary | ICD-10-CM | POA: Diagnosis not present

## 2021-08-15 DIAGNOSIS — K644 Residual hemorrhoidal skin tags: Secondary | ICD-10-CM | POA: Diagnosis present

## 2021-08-15 MED ORDER — METHOCARBAMOL 500 MG PO TABS
1000.0000 mg | ORAL_TABLET | Freq: Four times a day (QID) | ORAL | Status: DC
  Administered 2021-08-15 – 2021-08-17 (×8): 1000 mg via ORAL
  Filled 2021-08-15 (×9): qty 2

## 2021-08-15 MED ORDER — GABAPENTIN 100 MG PO CAPS
100.0000 mg | ORAL_CAPSULE | Freq: Three times a day (TID) | ORAL | Status: DC
  Administered 2021-08-15 – 2021-08-17 (×7): 100 mg via ORAL
  Filled 2021-08-15 (×7): qty 1

## 2021-08-15 MED ORDER — SIMETHICONE 80 MG PO CHEW
80.0000 mg | CHEWABLE_TABLET | Freq: Four times a day (QID) | ORAL | Status: DC
  Administered 2021-08-15 – 2021-08-17 (×9): 80 mg via ORAL
  Filled 2021-08-15 (×9): qty 1

## 2021-08-15 MED ORDER — ACETAMINOPHEN 650 MG RE SUPP: 650.0000 mg | Freq: Four times a day (QID) | RECTAL | Status: DC | PRN

## 2021-08-15 MED ORDER — OXYCODONE HCL 5 MG PO TABS
10.0000 mg | ORAL_TABLET | ORAL | Status: DC | PRN
  Administered 2021-08-15: 10 mg via ORAL
  Filled 2021-08-15: qty 2

## 2021-08-15 MED ORDER — OXYCODONE HCL 5 MG PO TABS
10.0000 mg | ORAL_TABLET | ORAL | Status: DC | PRN
  Administered 2021-08-17: 10 mg via ORAL
  Filled 2021-08-15: qty 2

## 2021-08-15 MED ORDER — METHOCARBAMOL 1000 MG PO TABS: 1000.0000 mg | ORAL_TABLET | Freq: Four times a day (QID) | ORAL | 2 refills | Status: DC | PRN

## 2021-08-15 MED ORDER — LACTATED RINGERS IV SOLN: INTRAVENOUS | Status: DC

## 2021-08-15 MED ORDER — FAMOTIDINE 20 MG PO TABS
20.0000 mg | ORAL_TABLET | Freq: Two times a day (BID) | ORAL | Status: DC | PRN
  Administered 2021-08-15 – 2021-08-16 (×2): 20 mg via ORAL
  Filled 2021-08-15 (×2): qty 1

## 2021-08-15 MED ORDER — ACETAMINOPHEN 325 MG PO TABS
325.0000 mg | ORAL_TABLET | Freq: Four times a day (QID) | ORAL | Status: DC | PRN
  Administered 2021-08-15: 650 mg via ORAL
  Filled 2021-08-15: qty 2

## 2021-08-15 MED ORDER — CHLORHEXIDINE GLUCONATE CLOTH 2 % EX PADS
6.0000 | MEDICATED_PAD | Freq: Every day | CUTANEOUS | Status: DC
  Administered 2021-08-15 – 2021-08-16 (×2): 6 via TOPICAL

## 2021-08-15 MED ORDER — NAPROXEN 250 MG PO TABS
500.0000 mg | ORAL_TABLET | Freq: Two times a day (BID) | ORAL | Status: DC
  Administered 2021-08-15 – 2021-08-17 (×5): 500 mg via ORAL
  Filled 2021-08-15 (×5): qty 2

## 2021-08-15 NOTE — Evaluation (Signed)
Physical Therapy Evaluation Patient Details Name: Joanna Yang MRN: [id number] DOB: 15-Mar-1970 Today's Date: 08/15/2021  History of Present Illness  Joanna Yang  , is 51 YO female  presented 08/13/2021  for surgery, with the diagnosis of PARASTOMAL HERNIA AROUND COLOSTOMY, S/P LAPAROSCOPIC REPAIR OF PARASTOMAL HERNIA WITH MESH   ,  LAPAROSCOPIC LYSIS OF ADHESIONS on 6/21.  Clinical Impression  The patient  has been ambulating in hall with family.  Patient expresses concern that she is limited I  taking deep  breaths. Encouraged patient to continue IS as able. Patient instructed in log roll, side to sit/to side for comfort in bed mobility, use of pillow to guard abdomen. Patient currently has assistance of family for mobility. No further acute PT needs at this time. PT will sign off. Patient does not desire a RW for home at this time.     Recommendations for follow up therapy are one component of a multi-disciplinary discharge planning process, led by the attending physician.  Recommendations may be updated based on patient status, additional functional criteria and insurance authorization.  Follow Up Recommendations No PT follow up      Assistance Recommended at Discharge PRN  Patient can return home with the following  Help with stairs or ramp for entrance;Assist for transportation;Assistance with cooking/housework    Equipment Recommendations None recommended by PT  Recommendations for Other Services       Functional Status Assessment Patient has not had a recent decline in their functional status     Precautions / Restrictions Precautions Precaution Comments: coloctomy Restrictions Weight Bearing Restrictions: No      Mobility  Bed Mobility Overal bed mobility: Needs Assistance Bed Mobility: Rolling, Sidelying to Sit, Sit to Sidelying Rolling: Supervision Sidelying to sit: Supervision     Sit to sidelying: Supervision General bed mobility comments: instructions given  and patient performed x 2 to log roll and side to sit to sidelying for abdominal protection    Transfers Overall transfer level: Needs assistance Equipment used: None Transfers: Sit to/from Stand Sit to Stand: Supervision           General transfer comment: close supervisioon, slow to rise, guarding    Ambulation/Gait Ambulation/Gait assistance: Min guard Gait Distance (Feet): 20 Feet (x2) Assistive device: None Gait Pattern/deviations: Step-through pattern Gait velocity: decr     General Gait Details: slow and guarded  Stairs            Wheelchair Mobility    Modified Rankin (Stroke Patients Only)       Balance Overall balance assessment: Mild deficits observed, not formally tested                                           Pertinent Vitals/Pain Pain Assessment Pain Assessment: 0-10 Pain Score: 7  Pain Location: abdomen Pain Descriptors / Indicators: Discomfort, Cramping, Squeezing Pain Intervention(s): Limited activity within patient's tolerance, Monitored during session, Premedicated before session, Repositioned (instructed in use of pillow over abdomen for guarding)    Home Living Family/patient expects to be discharged to:: Private residence Living Arrangements: Spouse/significant other;Children;Other relatives Available Help at Discharge: Family;Available 24 hours/day Type of Home: House Home Access: Stairs to enter Entrance Stairs-Rails: None Entrance Stairs-Number of Steps: 3   Home Layout: Two level;Able to live on main level with bedroom/bathroom Home Equipment: None      Prior Function Prior Level of  Function : Independent/Modified Independent                     Hand Dominance   Dominant Hand: Right    Extremity/Trunk Assessment   Upper Extremity Assessment Upper Extremity Assessment: Overall WFL for tasks assessed    Lower Extremity Assessment Lower Extremity Assessment: Overall WFL for tasks  assessed    Cervical / Trunk Assessment Cervical / Trunk Assessment: Normal;Other exceptions Cervical / Trunk Exceptions: guarding of trunk/abdomen  Communication   Communication: No difficulties  Cognition Arousal/Alertness: Awake/alert Behavior During Therapy: WFL for tasks assessed/performed Overall Cognitive Status: Within Functional Limits for tasks assessed                                          General Comments      Exercises     Assessment/Plan    PT Assessment Patient does not need any further PT services  PT Problem List         PT Treatment Interventions      PT Goals (Current goals can be found in the Care Plan section)  Acute Rehab PT Goals Patient Stated Goal: go home PT Goal Formulation: All assessment and education complete, DC therapy    Frequency       Co-evaluation               AM-PAC PT "6 Clicks" Mobility  Outcome Measure Help needed turning from your back to your side while in a flat bed without using bedrails?: None Help needed moving from lying on your back to sitting on the side of a flat bed without using bedrails?: None Help needed moving to and from a bed to a chair (including a wheelchair)?: None Help needed standing up from a chair using your arms (e.g., wheelchair or bedside chair)?: None Help needed to walk in hospital room?: A Little Help needed climbing 3-5 steps with a railing? : A Little 6 Click Score: 22    End of Session   Activity Tolerance: Patient tolerated treatment well;Patient limited by pain Patient left: in bed;with call bell/phone within reach;with family/visitor present Nurse Communication: Mobility status PT Visit Diagnosis: Pain    Time: 1610-9604 PT Time Calculation (min) (ACUTE ONLY): 30 min   Charges:   PT Evaluation $PT Eval Low Complexity: 1 Low PT Treatments $Self Care/Home Management: 8-22        Blanchard Kelch PT Acute Rehabilitation Services Office  [phone] Weekend [phone]   Rada Hay 08/15/2021, 12:25 PM

## 2021-08-15 NOTE — Progress Notes (Signed)
PVR was 630cc. Foley was placed.

## 2021-08-16 ENCOUNTER — Inpatient Hospital Stay (HOSPITAL_COMMUNITY): Payer: Managed Care, Other (non HMO)

## 2021-08-16 LAB — BASIC METABOLIC PANEL
Anion gap: 4 — ABNORMAL LOW (ref 5–15)
BUN: 10 mg/dL (ref 6–20)
CO2: 30 mmol/L (ref 22–32)
Calcium: 8.4 mg/dL — ABNORMAL LOW (ref 8.9–10.3)
Chloride: 106 mmol/L (ref 98–111)
Creatinine, Ser: 0.7 mg/dL (ref 0.44–1.00)
GFR, Estimated: >60 mL/min (ref >60)
Glucose, Bld: 95 mg/dL (ref 70–99)
Potassium: 3.6 mmol/L (ref 3.5–5.1)
Sodium: 140 mmol/L (ref 135–145)

## 2021-08-16 LAB — CBC
HCT: 35.1 % — ABNORMAL LOW (ref 36.0–46.0)
Hemoglobin: 11.7 g/dL — ABNORMAL LOW (ref 12.0–15.0)
MCH: 30.3 pg (ref 26.0–34.0)
MCHC: 33.3 g/dL (ref 30.0–36.0)
MCV: 90.9 fL (ref 80.0–100.0)
Platelets: 161 10*3/uL (ref 150–400)
RBC: 3.86 MIL/uL — ABNORMAL LOW (ref 3.87–5.11)
RDW: 13.2 % (ref 11.5–15.5)
WBC: 4.9 10*3/uL (ref 4.0–10.5)
nRBC: 0 % (ref 0.0–0.2)

## 2021-08-16 NOTE — Progress Notes (Signed)
3 Days Post-Op   Subjective/Chief Complaint: Feeling better Passing more gas in the ostomy No BM yet Reports so mild shortness of breath    Objective: Vital signs in last 24 hours: Temp:  [97.6 F (36.4 C)-99.7 F (37.6 C)] 99.7 F (37.6 C) (06/24 0529) Pulse Rate:  [83-94] 88 (06/24 0529) Resp:  [16-18] 18 (06/24 0529) BP: (92-110)/(71-79) 110/79 (06/24 0529) SpO2:  [91 %-96 %] 91 % (06/24 0529) Last BM Date : 08/12/21  Intake/Output from previous day: 06/23 0701 - 06/24 0700 In: 1900 [P.O.:1080; I.V.:820] Out: 3400 [Urine:3400] Intake/Output this shift: No intake/output data recorded.  Exam: Looks well Awake and alert No resp distress Ostomy pink  Lab Results:  Recent Labs    08/16/21 0523  WBC 4.9  HGB 11.7*  HCT 35.1*  PLT 161   BMET Recent Labs    08/16/21 0523  NA 140  K 3.6  CL 106  CO2 30  GLUCOSE 95  BUN 10  CREATININE 0.70  CALCIUM 8.4*   PT/INR No results for input(s): "LABPROT", "INR" in the last 72 hours. ABG No results for input(s): "PHART", "HCO3" in the last 72 hours.  Invalid input(s): "PCO2", "PO2"  Studies/Results: No results found.  Anti-infectives: Anti-infectives (From admission, onward)    Start     Dose/Rate Route Frequency Ordered Stop   08/13/21 2000  metroNIDAZOLE (FLAGYL) IVPB 500 mg        500 mg 100 mL/hr over 60 Minutes Intravenous Every 12 hours 08/13/21 1354 08/13/21 2157   08/13/21 1630  ceFAZolin (ANCEF) IVPB 2g/100 mL premix        2 g 200 mL/hr over 30 Minutes Intravenous Every 8 hours 08/13/21 1354 08/13/21 2050   08/13/21 0700  ceFAZolin (ANCEF) IVPB 2g/100 mL premix       See Hyperspace for full Linked Orders Report.   2 g 200 mL/hr over 30 Minutes Intravenous On call to O.R. 08/13/21 0981 08/13/21 0839   08/13/21 0700  metroNIDAZOLE (FLAGYL) IVPB 500 mg       See Hyperspace for full Linked Orders Report.   500 mg 100 mL/hr over 60 Minutes Intravenous On call to O.R. 08/13/21 1914 08/13/21 0850        Assessment/Plan: s/p Procedure(s): LAPAROSCOPIC PRIMARY PARASTOMAL HERNIA REPAIR WITH MESH AND BILATERAL TAP BLOCK (N/A) LYSIS OF ADHESION (N/A)   Will check a CXR  She wants to go home but would like to have a BM first which is what we would like as well given her complex history She will also go home with the foley Potential d/c later today vs tomorrow   LOS: 1 day    Abigail Miyamoto MD 08/16/2021

## 2021-12-31 ENCOUNTER — Other Ambulatory Visit: Payer: Self-pay | Admitting: Obstetrics and Gynecology

## 2021-12-31 ENCOUNTER — Encounter: Payer: Self-pay | Admitting: Student

## 2021-12-31 DIAGNOSIS — Z1231 Encounter for screening mammogram for malignant neoplasm of breast: Secondary | ICD-10-CM

## 2022-03-04 ENCOUNTER — Ambulatory Visit
Admission: RE | Admit: 2022-03-04 | Discharge: 2022-03-04 | Disposition: A | Discharge status: Home / Self Care | Payer: Managed Care, Other (non HMO) | Source: Ambulatory Visit | Attending: Obstetrics and Gynecology | Admitting: Obstetrics and Gynecology

## 2022-03-04 DIAGNOSIS — Z1231 Encounter for screening mammogram for malignant neoplasm of breast: Secondary | ICD-10-CM

## 2022-07-14 ENCOUNTER — Ambulatory Visit (INDEPENDENT_AMBULATORY_CARE_PROVIDER_SITE_OTHER): Payer: Self-pay | Admitting: Plastic Surgery

## 2022-07-14 DIAGNOSIS — Z719 Counseling, unspecified: Secondary | ICD-10-CM

## 2022-07-14 NOTE — Progress Notes (Signed)
Patient ID: Joanna Yang, female    DOB: [birth year], 52 y.o.   MRN: [id number]   Chief Complaint  Patient presents with   Advice Only   Laser Consultation    The patient is a 52 year old female here for evaluation of her skin.  Due to megacolon the patient had a colonic obstruction and underwent colon resection with an ostomy creation.  She has come to terms with having this for the long-term.  She said she is very happy with the improvement in her lifestyle.  She does have some fibroids, reflux and anxiety.  She is also had a hysterectomy, C-section and lysis of adhesions.  She is here to see if she is a candidate for hair reduction with the laser.  Her sister has done this as well.  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  She is interested in the lip and chin area, under the arms and in the groin area.    Review of Systems  Constitutional: Negative.   HENT: Negative.    Eyes: Negative.   Respiratory: Negative.    Cardiovascular: Negative.   Gastrointestinal: Negative.   Genitourinary: Negative.   Musculoskeletal: Negative.     Past Medical History:  Diagnosis Date   Anemia    Anxiety    Colonic obstruction (HCC) 12/21/2019   Complication of anesthesia    Endometrial polyp 03/22/2019   Family history of breast cancer    Fibroid    GERD (gastroesophageal reflux disease)    Heart murmur    pt. thinks she has a benign murmur dx'd during pregnancy   Mitral regurgitation    a. Prior 2D echo in 2014 showed technically limited study, EF 60%, mild MR, trace TR.   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    Past Surgical History:  Procedure Laterality Date   ABDOMINAL HYSTERECTOMY     ANAL RECTAL MANOMETRY N/A 04/19/2020   Procedure: ANO RECTAL MANOMETRY;  Surgeon: Romie Levee, MD;   Location: [facility];  Service: Endoscopy;  Laterality: N/A;   CESAREAN SECTION  2009   COLECTOMY     COLONOSCOPY     COLOSTOMY     CYSTOSCOPY N/A 12/18/2019   Procedure: CYSTOSCOPY;  Surgeon: Patton Salles, MD;  Location: [facility];  Service: Gynecology;  Laterality: N/A;   ESOPHAGOGASTRODUODENOSCOPY ENDOSCOPY     IUD REMOVAL  07/29/2019   expelling IUD   LAPAROSCOPIC PARASTOMAL HERNIA N/A 08/13/2021   Procedure: LAPAROSCOPIC PRIMARY PARASTOMAL HERNIA REPAIR WITH MESH AND BILATERAL TAP BLOCK;  Surgeon: Karie Soda, MD;  Location: [facility];  Service: General;  Laterality: N/A;   LAPAROTOMY N/A 12/21/2019   Procedure: EXPLORATORY LAPAROTOMY; RESECTION OF DESCENDING AND SIGMOID COLON AND TRANSVERSE COLOSTOMY;  Surgeon: Darnell Level, MD;  Location: [facility];  Service: General;  Laterality: N/A;   LYSIS OF ADHESION N/A 08/13/2021   Procedure: LYSIS OF ADHESION;  Surgeon: Karie Soda, MD;  Location: [facility];  Service: General;  Laterality: N/A;   TOTAL LAPAROSCOPIC HYSTERECTOMY WITH SALPINGECTOMY N/A 12/18/2019   Procedure: TOTAL LAPAROSCOPIC HYSTERECTOMY WITH SALPINGECTOMY and lysis of adhesions;  Surgeon: Patton Salles, MD;  Location: [facility];  Service: Gynecology;  Laterality: N/A;   TUBAL LIGATION  2009      Current Outpatient Medications:    acetaminophen (TYLENOL) 325 MG tablet, Take 650 mg by mouth every 6 (six) hours as needed for mild pain, fever or headache., Disp: , Rfl:    clonazePAM (KLONOPIN) 0.5 MG tablet, Take 0.5 mg by mouth daily as needed for anxiety., Disp: , Rfl:    fexofenadine (ALLEGRA) 180 MG tablet, Take 180 mg by mouth daily., Disp: , Rfl:    FLUoxetine (PROZAC) 20 MG capsule, Take 40 mg by mouth daily., Disp: , Rfl:    Melatonin 5 MG CAPS, Take 5 mg by mouth at bedtime as needed (sleep)., Disp: , Rfl:    oxyCODONE (OXY IR/ROXICODONE) 5 MG immediate release tablet, Take 1 tablet (5 mg total) by mouth every 6  (six) hours as needed for moderate pain, severe pain or breakthrough pain., Disp: 20 tablet, Rfl: 0   rosuvastatin (CRESTOR) 10 MG tablet, Take 10 mg by mouth at bedtime., Disp: , Rfl:    Objective:   There were no vitals filed for this visit.  Physical Exam Constitutional:      Appearance: Normal appearance.  Cardiovascular:     Rate and Rhythm: Normal rate.  Pulmonary:     Effort: Pulmonary effort is normal.  Musculoskeletal:        General: No swelling or deformity.  Skin:    General: Skin is warm.     Capillary Refill: Capillary refill takes more than 3 seconds.  Neurological:     Mental Status: She is alert.  Psychiatric:        Mood and Affect: Mood normal.        Behavior: Behavior normal.        Thought Content: Thought content normal.        Judgment: Judgment normal.     Assessment & Plan:  Encounter for counseling  Patient knows that we will start with the groin area and see if we can get some improvement and reduction in hair growth.  Depending on how she responds we can move to the other areas of her concern.  She is a good candidate for laser hair removal and we will get her a quote and set her up if she so desires.  Alena Bills Zykeria Laguardia, DO

## 2022-07-29 ENCOUNTER — Ambulatory Visit (INDEPENDENT_AMBULATORY_CARE_PROVIDER_SITE_OTHER): Payer: Self-pay | Admitting: Student

## 2022-07-29 DIAGNOSIS — Z719 Counseling, unspecified: Secondary | ICD-10-CM

## 2022-07-29 NOTE — Progress Notes (Addendum)
Patient is a 52 year old who presents to the clinic today requesting hair reduction/removal in her bikini area.  Patient also requested removal of some hair she had on her medial thighs.  She she denies any history of skin cancer.  She denies any current infections or autoimmune disorders.  She states that she is not pregnant as she is status post hysterectomy.  She denies any history of keloids.  We discussed the risks of the procedure and expectations.  All of the patient's questions were answered to her satisfaction.  Patient wished to proceed with procedure.   Preoperative Dx: hair to bikini area  Postoperative Dx:  same  Procedure: laser hair removal to bikini area  Anesthesia: none  Description of Procedure:  Risks and complications were explained to the patient. Consent was confirmed. Time out was called and all information was confirmed to be correct. The area  area was prepped with alcohol and wiped dry. BBL forever bare laser was set at 8-9 J/cm2 using 640 nm filter with 20 width and 13 degrees Celsius. The inguinal/bikini was lasered.  The BBL forever bare laser was then set to 8.5 J/cm with a pulse width of 15 and a temperature of 15.  The hair to the medial thighs was lasered.  The patient tolerated the procedure well and there were no complications.  Postprocedural instructions were discussed with the patient.  The patient is to follow up in 4 weeks.   Chaperone was present during the entirety of today's procedure.

## 2022-08-19 ENCOUNTER — Ambulatory Visit
Admission: RE | Admit: 2022-08-19 | Discharge: 2022-08-19 | Disposition: A | Discharge status: Home / Self Care | Payer: Managed Care, Other (non HMO) | Source: Ambulatory Visit | Attending: Physician Assistant | Admitting: Physician Assistant

## 2022-08-19 ENCOUNTER — Telehealth: Payer: Self-pay | Admitting: Interventional Cardiology

## 2022-08-19 ENCOUNTER — Other Ambulatory Visit: Payer: Self-pay | Admitting: Physician Assistant

## 2022-08-19 DIAGNOSIS — M50123 Cervical disc disorder at C6-C7 level with radiculopathy: Secondary | ICD-10-CM

## 2022-08-19 DIAGNOSIS — M5412 Radiculopathy, cervical region: Secondary | ICD-10-CM

## 2022-08-19 DIAGNOSIS — R002 Palpitations: Secondary | ICD-10-CM

## 2022-08-19 NOTE — Telephone Encounter (Signed)
Spoke to the pt, yesterday pt had appointment with her PCP c/o left arm pain for the past 5 days         (starting at the left side of neck toward the fingers), she did receive an injection of Toradol and Depo-Medrol. EKG was completed , advised it was abnormal and to contact cardiology. PCP will like to compare her current EKG to the previous EKG. Pt stated couple of times this month she has brief moments of rapid HR.   Pt stated she had x-ray of her neck completed this morning. She is scheduled with [facility] on 08/26/22 however, she will like to be seen sooner. Explained ED precautions, pt voiced understanding. Will forward to MD and nurse for advise.

## 2022-08-19 NOTE — Telephone Encounter (Signed)
Pt states she is having left arm and back pain as well as numbness in her fingers. She would like a callback regarding this matter. Please advise.

## 2022-08-20 NOTE — Telephone Encounter (Signed)
Do we have the ECG from PCP? Can order 2 week Zio patch for palpitations

## 2022-08-21 ENCOUNTER — Ambulatory Visit: Payer: Managed Care, Other (non HMO) | Attending: Interventional Cardiology

## 2022-08-21 DIAGNOSIS — R002 Palpitations: Secondary | ICD-10-CM | POA: Diagnosis not present

## 2022-08-21 NOTE — Telephone Encounter (Signed)
I reviewed the ECG from her primary care doctor's office and compared it to the ECG done in 2022.  There is no significant change.  Both ECGs had a nonspecific T wave abnormality.

## 2022-08-21 NOTE — Progress Notes (Unsigned)
Applied a 14 day Zio XT monitor to patient in the office ?

## 2022-08-21 NOTE — Telephone Encounter (Signed)
EKG requested from Dr  Fortunato Curling office.  Note is in care everywhere.  From 6/25 office visit-  ECG Results - documented in this encounter ECG 12 lead (08/18/2022 3:24 PM EDT) ECG Results - ECG 12 lead (08/18/2022 3:24 PM EDT) Impressions  [facility] - 08/18/2022 3:24 PM EDT  NSR with no specific T wave abnormality     I spoke with patient and let her know Dr Eldridge Dace recommends she wear a 2 week zio patch.  Option give to patient to have monitor mailed to her or have applied in office today or at time of appointment on 7/3.  Patient would like to have applied in office today.  She will come in today at 11:00

## 2022-08-26 ENCOUNTER — Ambulatory Visit: Payer: Managed Care, Other (non HMO) | Attending: Physician Assistant | Admitting: Physician Assistant

## 2022-08-26 ENCOUNTER — Encounter: Payer: Self-pay | Admitting: Physician Assistant

## 2022-08-26 VITALS — BP 106/72 | HR 93 | Ht 66.0 in | Wt 167.4 lb

## 2022-08-26 DIAGNOSIS — F411 Generalized anxiety disorder: Secondary | ICD-10-CM

## 2022-08-26 DIAGNOSIS — R072 Precordial pain: Secondary | ICD-10-CM

## 2022-08-26 DIAGNOSIS — E785 Hyperlipidemia, unspecified: Secondary | ICD-10-CM | POA: Diagnosis not present

## 2022-08-26 DIAGNOSIS — M79602 Pain in left arm: Secondary | ICD-10-CM

## 2022-08-26 MED ORDER — ROSUVASTATIN CALCIUM 5 MG PO TABS: 5.0000 mg | ORAL_TABLET | Freq: Every day | ORAL | 3 refills | Status: DC

## 2022-08-26 MED ORDER — METOPROLOL TARTRATE 100 MG PO TABS: 100.0000 mg | ORAL_TABLET | Freq: Once | ORAL | 0 refills | Status: DC

## 2022-08-26 NOTE — Patient Instructions (Addendum)
Medication Instructions:  Decrease rosuvastatin (Crestor) to 5 mg daily *If you need a refill on your cardiac medications before your next appointment, please call your pharmacy*   Lab Work: BMET-TODAY Fasting lipids and lft's in 8 weeks If you have labs (blood work) drawn today and your tests are completely normal, you will receive your results only by: MyChart Message (if you have MyChart) OR A paper copy in the mail If you have any lab test that is abnormal or we need to change your treatment, we will call you to review the results.   Testing/Procedures: Your physician has requested that you have an echocardiogram. Echocardiography is a painless test that uses sound waves to create images of your heart. It provides your doctor with information about the size and shape of your heart and how well your heart's chambers and valves are working. This procedure takes approximately one hour. There are no restrictions for this procedure. Please do NOT wear cologne, perfume, aftershave, or lotions (deodorant is allowed). Please arrive 15 minutes prior to your appointment time.     Your cardiac CT will be scheduled at one of the below locations:   [facility] [address] [place], [place] [address] [phone]  OR  [facility] [address] [place], [place] [address] [phone]  OR   [facility] [address] [place], [place] [address] [phone]  If scheduled at [facility], please arrive at the [facility] En[facility]ance (Entrance C2) of [facility] 30 minutes prior to test start time. You can use the FREE valet parking offered at entrance C (encouraged to control the heart rate for the test)  Proceed to the [facility] Radiology Department (first floor) to check-in and test prep.  All radiology patients and guests should use entrance C2 at [facility],  accessed from [facility], even though the hospital's physical address listed is [address].    If scheduled at [facility] or [facility], please arrive 15 mins early for check-in and test prep.   Please follow these instructions carefully (unless otherwise directed):  An IV will be required for this test and Nitroglycerin will be given.  Hold all erectile dysfunction medications at least 3 days (72 hrs) prior to test. (Ie viagra, cialis, sildenafil, tadalafil, etc)   On the Night Before the Test: Be sure to Drink plenty of water. Do not consume any caffeinated/decaffeinated beverages or chocolate 12 hours prior to your test. Do not take any antihistamines 12 hours prior to your test. If the patient has contrast allergy: Patient will need a prescription for Prednisone and very clear instructions (as follows): Prednisone 50 mg - take 13 hours prior to test Take another Prednisone 50 mg 7 hours prior to test Take another Prednisone 50 mg 1 hour prior to test Take Benadryl 50 mg 1 hour prior to test Patient must complete all four doses of above prophylactic medications. Patient will need a ride after test due to Benadryl.  On the Day of the Test: Drink plenty of water until 1 hour prior to the test. Do not eat any food 1 hour prior to test. You may take your regular medications prior to the test.  Take metoprolol tartrate (Lopressor) 100 mg two hours prior to test. If you take Furosemide/Hydrochlorothiazide/Spironolactone, please HOLD on the morning of the test. FEMALES- please wear underwire-free bra if available, avoid  dresses & tight clothing       After the Test: Drink plenty of water. After receiving IV contrast, you may experience a mild flushed feeling. This is normal. On occasion, you may experience a mild rash up to 24 hours after the test. This is not dangerous. If this occurs, you can take Benadryl 25  mg and increase your fluid intake. If you experience trouble breathing, this can be serious. If it is severe call 911 IMMEDIATELY. If it is mild, please call our office. If you take any of these medications: Glipizide/Metformin, Avandament, Glucavance, please do not take 48 hours after completing test unless otherwise instructed.  We will call to schedule your test 2-4 weeks out understanding that some insurance companies will need an authorization prior to the service being performed.   For more information and frequently asked questions, please visit our website : [email or web address]  For non-scheduling related questions, please contact the cardiac imaging nurse navigator should you have any questions/concerns: Rockwell [place], Cardiac Imaging Nurse Navigator Larey Brick, Cardiac Imaging Nurse Navigator [facility] Direct Office Dial: [phone]   For scheduling needs, including cancellations and rescheduling, please call [place], [phone].   Follow-Up: At [facility], you and your health needs are our priority.  As part of our continuing mission to provide you with exceptional heart care, we have created designated Provider Care Teams.  These Care Teams include your primary Cardiologist (physician) and Advanced Practice Providers (APPs -  Physician Assistants and Nurse Practitioners) who all work together to provide you with the care you need, when you need it.    Your next appointment:   2-3 week(s)  Provider:   [name], MD  or Jari Favre, PA-C     Low-Sodium Eating Plan Salt (sodium) helps you keep a healthy balance of fluids in your body. Too much sodium can raise your blood pressure. It can also cause fluid and waste to be held in your body. Your health care provider or dietitian may recommend a low-sodium eating plan if you have high blood pressure (hypertension), kidney disease, liver disease, or heart failure.  Eating less sodium can help lower your blood pressure and reduce swelling. It can also protect your heart, liver, and kidneys. What are tips for following this plan? Reading food labels  Check food labels for the amount of sodium per serving. If you eat more than one serving, you must multiply the listed amount by the number of servings. Choose foods with less than 140 milligrams (mg) of sodium per serving. Avoid foods with 300 mg of sodium or more per serving. Always check how much sodium is in a product, even if the label says "unsalted" or "no salt added." Shopping  Buy products labeled as "low-sodium" or "no salt added." Buy fresh foods. Avoid canned foods and pre-made or frozen meals. Avoid canned, cured, or processed meats. Buy breads that have less than 80 mg of sodium per slice. Cooking  Eat more home-cooked food. Try to eat less restaurant, buffet, and fast food. Try not to add salt when you cook. Use salt-free seasonings or herbs instead of table salt or sea salt. Check with your provider or pharmacist before using salt substitutes. Cook with plant-based oils, such as canola, sunflower, or olive oil. Meal planning When eating at a restaurant, ask if your food can be made with less salt or no salt. Avoid dishes labeled as brined, pickled, cured, or smoked. Avoid dishes made with soy sauce,  miso, or teriyaki sauce. Avoid foods that have monosodium glutamate (MSG) in them. MSG may be added to some restaurant food, sauces, soups, bouillon, and canned foods. Make meals that can be grilled, baked, poached, roasted, or steamed. These are often made with less sodium. General information Try to limit your sodium intake to 1,500-2,300 mg each day, or the amount told by your provider. What foods should I eat? Fruits Fresh, frozen, or canned fruit. Fruit juice. Vegetables Fresh or frozen vegetables. "No salt added" canned vegetables. "No salt added" tomato sauce and paste. Low-sodium or  reduced-sodium tomato and vegetable juice. Grains Low-sodium cereals, such as oats, puffed wheat and rice, and shredded wheat. Low-sodium crackers. Unsalted rice. Unsalted pasta. Low-sodium bread. Whole grain breads and whole grain pasta. Meats and other proteins Fresh or frozen meat, poultry, seafood, and fish. These should have no added salt. Low-sodium canned tuna and salmon. Unsalted nuts. Dried peas, beans, and lentils without added salt. Unsalted canned beans. Eggs. Unsalted nut butters. Dairy Milk. Soy milk. Cheese that is naturally low in sodium, such as ricotta cheese, fresh mozzarella, or Swiss cheese. Low-sodium or reduced-sodium cheese. Cream cheese. Yogurt. Seasonings and condiments Fresh and dried herbs and spices. Salt-free seasonings. Low-sodium mustard and ketchup. Sodium-free salad dressing. Sodium-free light mayonnaise. Fresh or refrigerated horseradish. Lemon juice. Vinegar. Other foods Homemade, reduced-sodium, or low-sodium soups. Unsalted popcorn and pretzels. Low-salt or salt-free chips. The items listed above may not be all the foods and drinks you can have. Talk to a dietitian to learn more. What foods should I avoid? Vegetables Sauerkraut, pickled vegetables, and relishes. Olives. [place] fries. Onion rings. Regular canned vegetables, except low-sodium or reduced-sodium items. Regular canned tomato sauce and paste. Regular tomato and vegetable juice. Frozen vegetables in sauces. Grains Instant hot cereals. Bread stuffing, pancake, and biscuit mixes. Croutons. Seasoned rice or pasta mixes. Noodle soup cups. Boxed or frozen macaroni and cheese. Regular salted crackers. Self-rising flour. Meats and other proteins Meat or fish that is salted, canned, smoked, spiced, or pickled. Precooked or cured meat, such as sausages or meat loaves. Tomasa Blase. Ham. Pepperoni. Hot dogs. Corned beef. Chipped beef. Salt pork. Jerky. Pickled herring, anchovies, and sardines. Regular canned tuna.  Salted nuts. Dairy Processed cheese and cheese spreads. Hard cheeses. Cheese curds. Blue cheese. Feta cheese. String cheese. Regular cottage cheese. Buttermilk. Canned milk. Fats and oils Salted butter. Regular margarine. Ghee. Bacon fat. Seasonings and condiments Onion salt, garlic salt, seasoned salt, table salt, and sea salt. Canned and packaged gravies. Worcestershire sauce. Tartar sauce. Barbecue sauce. Teriyaki sauce. Soy sauce, including reduced-sodium soy sauce. Steak sauce. Fish sauce. Oyster sauce. Cocktail sauce. Horseradish that you find on the shelf. Regular ketchup and mustard. Meat flavorings and tenderizers. Bouillon cubes. Hot sauce. Pre-made or packaged marinades. Pre-made or packaged taco seasonings. Relishes. Regular salad dressings. Salsa. Other foods Salted popcorn and pretzels. Corn chips and puffs. Potato and tortilla chips. Canned or dried soups. Pizza. Frozen entrees and pot pies. The items listed above may not be all the foods and drinks you should avoid. Talk to a dietitian to learn more. This information is not intended to replace advice given to you by your health care provider. Make sure you discuss any questions you have with your health care provider. Document Revised: 02/26/2022 Document Reviewed: 02/26/2022 Elsevier Patient Education  2024 [employer]  Heart-Healthy Eating Plan Many factors influence your heart health, including eating and exercise habits. Heart health is also called coronary health. Coronary risk increases with abnormal  blood fat (lipid) levels. A heart-healthy eating plan includes limiting unhealthy fats, increasing healthy fats, limiting salt (sodium) intake, and making other diet and lifestyle changes. What is my plan? Your health care provider may recommend that: You limit your fat intake to _________% or less of your total calories each day. You limit your saturated fat intake to _________% or less of your total calories each day. You  limit the amount of cholesterol in your diet to less than _________ mg per day. You limit the amount of sodium in your diet to less than _________ mg per day. What are tips for following this plan? Cooking Cook foods using methods other than frying. Baking, boiling, grilling, and broiling are all good options. Other ways to reduce fat include: Removing the skin from poultry. Removing all visible fats from meats. Steaming vegetables in water or broth. Meal planning  At meals, imagine dividing your plate into fourths: Fill one-half of your plate with vegetables and green salads. Fill one-fourth of your plate with whole grains. Fill one-fourth of your plate with lean protein foods. Eat 2-4 cups of vegetables per day. One cup of vegetables equals 1 cup (91 g) broccoli or cauliflower florets, 2 medium carrots, 1 large bell pepper, 1 large sweet potato, 1 large tomato, 1 medium white potato, 2 cups (150 g) raw leafy greens. Eat 1-2 cups of fruit per day. One cup of fruit equals 1 small apple, 1 large banana, 1 cup (237 g) mixed fruit, 1 large orange,  cup (82 g) dried fruit, 1 cup (240 mL) 100% fruit juice. Eat more foods that contain soluble fiber. Examples include apples, broccoli, carrots, beans, peas, and barley. Aim to get 25-30 g of fiber per day. Increase your consumption of legumes, nuts, and seeds to 4-5 servings per week. One serving of dried beans or legumes equals  cup (90 g) cooked, 1 serving of nuts is  oz (12 almonds, 24 pistachios, or 7 walnut halves), and 1 serving of seeds equals  oz (8 g). Fats Choose healthy fats more often. Choose monounsaturated and polyunsaturated fats, such as olive and canola oils, avocado oil, flaxseeds, walnuts, almonds, and seeds. Eat more omega-3 fats. Choose salmon, mackerel, sardines, tuna, flaxseed oil, and ground flaxseeds. Aim to eat fish at least 2 times each week. Check food labels carefully to identify foods with trans fats or high amounts  of saturated fat. Limit saturated fats. These are found in animal products, such as meats, butter, and cream. Plant sources of saturated fats include palm oil, palm kernel oil, and coconut oil. Avoid foods with partially hydrogenated oils in them. These contain trans fats. Examples are stick margarine, some tub margarines, cookies, crackers, and other baked goods. Avoid fried foods. General information Eat more home-cooked food and less restaurant, buffet, and fast food. Limit or avoid alcohol. Limit foods that are high in added sugar and simple starches such as foods made using white refined flour (white breads, pastries, sweets). Lose weight if you are overweight. Losing just 5-10% of your body weight can help your overall health and prevent diseases such as diabetes and heart disease. Monitor your sodium intake, especially if you have high blood pressure. Talk with your health care provider about your sodium intake. Try to incorporate more vegetarian meals weekly. What foods should I eat? Fruits All fresh, canned (in natural juice), or frozen fruits. Vegetables Fresh or frozen vegetables (raw, steamed, roasted, or grilled). Green salads. Grains Most grains. Choose whole wheat and whole grains  most of the time. Rice and pasta, including brown rice and pastas made with whole wheat. Meats and other proteins Lean, well-trimmed beef, veal, pork, and lamb. Chicken and [place] without skin. All fish and shellfish. Wild duck, rabbit, pheasant, and venison. Egg whites or low-cholesterol egg substitutes. Dried beans, peas, lentils, and tofu. Seeds and most nuts. Dairy Low-fat or nonfat cheeses, including ricotta and mozzarella. Skim or 1% milk (liquid, powdered, or evaporated). Buttermilk made with low-fat milk. Nonfat or low-fat yogurt. Fats and oils Non-hydrogenated (trans-free) margarines. Vegetable oils, including soybean, sesame, sunflower, olive, avocado, peanut, safflower, corn, canola, and  cottonseed. Salad dressings or mayonnaise made with a vegetable oil. Beverages Water (mineral or sparkling). Coffee and tea. Unsweetened ice tea. Diet beverages. Sweets and desserts Sherbet, gelatin, and fruit ice. Small amounts of dark chocolate. Limit all sweets and desserts. Seasonings and condiments All seasonings and condiments. The items listed above may not be a complete list of foods and beverages you can eat. Contact a dietitian for more options. What foods should I avoid? Fruits Canned fruit in heavy syrup. Fruit in cream or butter sauce. Fried fruit. Limit coconut. Vegetables Vegetables cooked in cheese, cream, or butter sauce. Fried vegetables. Grains Breads made with saturated or trans fats, oils, or whole milk. Croissants. Sweet rolls. Donuts. High-fat crackers, such as cheese crackers and chips. Meats and other proteins Fatty meats, such as hot dogs, ribs, sausage, bacon, rib-eye roast or steak. High-fat deli meats, such as salami and bologna. Caviar. Domestic duck and goose. Organ meats, such as liver. Dairy Cream, sour cream, cream cheese, and creamed cottage cheese. Whole-milk cheeses. Whole or 2% milk (liquid, evaporated, or condensed). Whole buttermilk. Cream sauce or high-fat cheese sauce. Whole-milk yogurt. Fats and oils Meat fat, or shortening. Cocoa butter, hydrogenated oils, palm oil, coconut oil, palm kernel oil. Solid fats and shortenings, including bacon fat, salt pork, lard, and butter. Nondairy cream substitutes. Salad dressings with cheese or sour cream. Beverages Regular sodas and any drinks with added sugar. Sweets and desserts Frosting. Pudding. Cookies. Cakes. Pies. Milk chocolate or white chocolate. Buttered syrups. Full-fat ice cream or ice cream drinks. The items listed above may not be a complete list of foods and beverages to avoid. Contact a dietitian for more information. Summary Heart-healthy meal planning includes limiting unhealthy fats,  increasing healthy fats, limiting salt (sodium) intake and making other diet and lifestyle changes. Lose weight if you are overweight. Losing just 5-10% of your body weight can help your overall health and prevent diseases such as diabetes and heart disease. Focus on eating a balance of foods, including fruits and vegetables, low-fat or nonfat dairy, lean protein, nuts and legumes, whole grains, and heart-healthy oils and fats. This information is not intended to replace advice given to you by your health care provider. Make sure you discuss any questions you have with your health care provider. Document Revised: 03/17/2021 Document Reviewed: 03/17/2021 Elsevier Patient Education  2024 [employer].

## 2022-08-26 NOTE — Progress Notes (Signed)
Cardiology Office Note:  .   Date:  08/26/2022  ID:  Joanna [facility], DOB 03-23-1970, MRN [id number] PCP: [name], MD  [facility] Providers Cardiologist:  [name], MD {  History of Present Illness: .   Joanna Yang is a 52 y.o. female with past medical history of panic attacks and atypical chest pain here for follow-up appointment.  ETT 01/2016 inconclusive with upsloping of 1 mm ST depression resolved 1 minute into recovery, stress echo 02/2016 with sinus tach 2 to 3 mm horizontal to upsloping ST depression at peak stress, normal LVEF recovery and peak stress.  Went to [facility] for second opinion.  Was very anxious about her health at that time.  Multiple mammograms in the same year because she felt lumps.  Symptoms of sharp chest pain only began a few weeks prior to evaluation after initial abnormal ETT where she became worried about a heart attack.  Essentially no risk for CAD and never had chest pain with exertion in the past.  Current symptoms atypical and unlikely to be cardiac in origin.  Sounds as though she had a panic attack 03/12/2016 when she called 911.  We did offer a coronary CTA January 2018 which she declined due to concerns about radiation.  Had hysterectomy in 10/21.  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  Sister with breast cancer.  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  She saw Dr. [name] 03/19/2020 and had atypical chest pain and mild DOE.  Echo ordered for HOCM screening based on her sister.  Echo 2/22 was normal.  In the ED with panic attack and chest pain, CT 12/18/2020 with no PE, 4 mm left lower lobe nodule.  She was last seen April 2023 and wanted to talk about ED visit 12/18/2020 and upcoming surgery.  Patient says D-dimer was [facility] she went to ED which was not seen on our end.  She is on rosuvastatin for cholesterol by PCP.  Walks some but no consistent  exercise.  Joint [employer] and doing light weights.  No regular chest pain other than anxiety.  No exertional chest pain.  Asking to have coronary CTA that was offered in 2018.  LDL 99 down from 146.  Patient very anxious.  Today, she tells me that she never had her coronary CTA because she was worried about radiation.  She was started on Crestor for an LDL of 140.  Now LDL is at goal however, patient is worried about statins and side effects.  We discussed decreasing to 5 mg and repeating a lipid panel in 8 weeks.  She has been experiencing left arm pain for the last 2 weeks that goes down into her fingers and some numbness.  She does have some issues with her neck and shoulder that might be the culprit but since she has a strong family history of coronary disease we discussed a few different tests today.  Offered exercise stress test but ultimately decided on coronary CTA.  She shares that her mother passed away from atrial fibrillation and heart failure and her sister who is 35 years old has HCM.  We also discussed updating a echocardiogram.  Otherwise, patient is doing well today.  She has a monitor on since she was experiencing palpitations.  Reports no shortness of breath nor dyspnea on exertion. Reports no chest pain, pressure, or tightness. No edema, orthopnea, PND.   ROS: Pertinent  ROS in HPI  Studies Reviewed: .       Echo 04/11/20 IMPRESSIONS     1. Left ventricular ejection fraction, by estimation, is 65 to 70%. The  left ventricle has normal function. The left ventricle has no regional  wall motion abnormalities. Left ventricular diastolic parameters are  indeterminate.   2. Right ventricular systolic function is normal. The right ventricular  size is normal.   3. The mitral valve is normal in structure. No evidence of mitral valve  regurgitation.   4. The aortic valve is normal in structure. Aortic valve regurgitation is  not visualized.   FINDINGS   Left Ventricle: Left  ventricular ejection fraction, by estimation, is 65  to 70%. The left ventricle has normal function. The left ventricle has no  regional wall motion abnormalities. The left ventricular internal cavity  size was normal in size. There is   no left ventricular hypertrophy. Left ventricular diastolic parameters  are indeterminate.   Right Ventricle: The right ventricular size is normal. Right vetricular  wall thickness was not assessed. Right ventricular systolic function is  normal.   Left Atrium: Left atrial size was normal in size.   Right Atrium: Right atrial size was normal in size.   Pericardium: There is no evidence of pericardial effusion.   Mitral Valve: The mitral valve is normal in structure. No evidence of  mitral valve regurgitation.   Tricuspid Valve: The tricuspid valve is grossly normal. Tricuspid valve  regurgitation is trivial.   Aortic Valve: The aortic valve is normal in structure. Aortic valve  regurgitation is not visualized.   Pulmonic Valve: The pulmonic valve was not well visualized. Pulmonic valve  regurgitation is not visualized.   Aorta: The aortic root and ascending aorta are structurally normal, with  no evidence of dilitation.   IAS/Shunts: No atrial level shunt detected by color flow Doppler.        Physical Exam:   VS:  BP 106/72   Pulse 93   Ht 5\' 6"  (1.676 m)   Wt 167 lb 6.4 oz (75.9 kg)   LMP 07/28/2019   SpO2 97%   BMI 27.02 kg/m    Wt Readings from Last 3 Encounters:  08/26/22 167 lb 6.4 oz (75.9 kg)  08/13/21 165 lb 3.2 oz (74.9 kg)  08/05/21 165 lb 3.2 oz (74.9 kg)    GEN: Well nourished, well developed in no acute distress NECK: No JVD; No carotid bruits CARDIAC: RRR, no murmurs, rubs, gallops RESPIRATORY:  Clear to auscultation without rales, wheezing or rhonchi  ABDOMEN: Soft, non-tender, non-distended EXTREMITIES:  No edema; No deformity   ASSESSMENT AND PLAN: .   1.  Anxiety -She is on clonazepam and Prozac  2.   Precordial chest pain -echo for HCM -Sounds more musculoskeletal with pain radiating down left arm and pins-and-needles in her fingers however, will plan for coronary CTA -Discussed possibly driving LDL down to less than 70 for  risk reduction  3.  Hyperlipidemia -Continue Crestor, decreased to 5 mg daily -Will repeat a lipid panel in 8 weeks to reevaluate  4. Palpitations-three times last month -She is currently wearing a monitor and will mail it in at the end of the week -We are ordering an updated echocardiogram for further evaluation  5. Sleep apnea and mouth piece  -She has been compliant with her CPAP device -She does endorse some gasping for air when she does not wear it      Dispo: She can follow-up  in a few weeks after testing  Signed, Sharlene Dory, PA-C

## 2022-08-27 LAB — BASIC METABOLIC PANEL
BUN/Creatinine Ratio: 22 (ref 9–23)
BUN: 18 mg/dL (ref 6–24)
CO2: 26 mmol/L (ref 20–29)
Calcium: 9.8 mg/dL (ref 8.7–10.2)
Chloride: 101 mmol/L (ref 96–106)
Creatinine, Ser: 0.81 mg/dL (ref 0.57–1.00)
Glucose: 89 mg/dL (ref 70–99)
Potassium: 4.5 mmol/L (ref 3.5–5.2)
Sodium: 141 mmol/L (ref 134–144)
eGFR: 88 mL/min/{1.73_m2} (ref >59)

## 2022-09-03 ENCOUNTER — Telehealth (HOSPITAL_COMMUNITY): Payer: Self-pay | Admitting: *Deleted

## 2022-09-03 ENCOUNTER — Encounter (HOSPITAL_COMMUNITY): Payer: Self-pay

## 2022-09-03 NOTE — Telephone Encounter (Signed)
Attempted to call patient regarding upcoming cardiac CT appointment. °Left message on voicemail with name and callback number ° °Jalie Eiland RN Navigator Cardiac Imaging °[facility] °[phone] Office °[phone] Cell ° °

## 2022-09-04 ENCOUNTER — Ambulatory Visit (HOSPITAL_COMMUNITY)
Admission: RE | Admit: 2022-09-04 | Discharge: 2022-09-04 | Disposition: A | Discharge status: Home / Self Care | Payer: Managed Care, Other (non HMO) | Source: Ambulatory Visit | Attending: Physician Assistant | Admitting: Physician Assistant

## 2022-09-04 DIAGNOSIS — R072 Precordial pain: Secondary | ICD-10-CM

## 2022-09-04 MED ORDER — NITROGLYCERIN 0.4 MG SL SUBL
SUBLINGUAL_TABLET | SUBLINGUAL | Status: AC
  Filled 2022-09-04: qty 2

## 2022-09-04 MED ORDER — NITROGLYCERIN 0.4 MG SL SUBL
0.8000 mg | SUBLINGUAL_TABLET | Freq: Once | SUBLINGUAL | Status: AC
  Administered 2022-09-04: 0.8 mg via SUBLINGUAL

## 2022-09-04 MED ORDER — IOHEXOL 350 MG/ML SOLN
95.0000 mL | Freq: Once | INTRAVENOUS | Status: AC | PRN
  Administered 2022-09-04: 95 mL via INTRAVENOUS

## 2022-09-04 NOTE — Progress Notes (Signed)
Patient tolerated CT well.Vital signs stable encourage to drink water throughout day.Reasons explained and verbalized understanding. Ambulated steady gait.   

## 2022-09-07 ENCOUNTER — Ambulatory Visit (HOSPITAL_COMMUNITY)
Admission: RE | Admit: 2022-09-07 | Discharge: 2022-09-07 | Disposition: A | Discharge status: Home / Self Care | Payer: Managed Care, Other (non HMO) | Source: Ambulatory Visit | Attending: Nurse Practitioner | Admitting: Nurse Practitioner

## 2022-09-07 ENCOUNTER — Other Ambulatory Visit (HOSPITAL_COMMUNITY): Payer: Self-pay | Admitting: Nurse Practitioner

## 2022-09-07 DIAGNOSIS — K5931 Toxic megacolon: Secondary | ICD-10-CM | POA: Diagnosis not present

## 2022-09-07 DIAGNOSIS — Z433 Encounter for attention to colostomy: Secondary | ICD-10-CM | POA: Diagnosis present

## 2022-09-07 DIAGNOSIS — K9400 Colostomy complication, unspecified: Secondary | ICD-10-CM

## 2022-09-07 DIAGNOSIS — K9409 Other complications of colostomy: Secondary | ICD-10-CM | POA: Insufficient documentation

## 2022-09-07 DIAGNOSIS — L928 Other granulomatous disorders of the skin and subcutaneous tissue: Secondary | ICD-10-CM | POA: Insufficient documentation

## 2022-09-07 DIAGNOSIS — K6389 Other specified diseases of intestine: Secondary | ICD-10-CM | POA: Diagnosis not present

## 2022-09-07 MED ORDER — SILVER NITRATE-POT NITRATE 75-25 % EX MISC: CUTANEOUS | 0 refills | Status: AC

## 2022-09-07 NOTE — Progress Notes (Signed)
Joanna Yang   Reason for visit:  LLQ colostomy HPI:  Toxic megacolon with colostomy Past Medical History:  Diagnosis Date   Anemia    Anxiety    Colonic obstruction (HCC) 12/21/2019   Complication of anesthesia    Endometrial polyp 03/22/2019   Family history of breast cancer    Fibroid    GERD (gastroesophageal reflux disease)    Heart murmur    pt. thinks she has a benign murmur dx'd during pregnancy   Mitral regurgitation    a. Prior 2D echo in 2014 showed technically limited study, EF 60%, mild MR, trace TR.   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   Family History  Problem Relation Age of Onset   Hypertension Mother    Arrhythmia Mother    Depression Mother    Heart disease Mother    Heart failure Mother    Diabetes Mother    Hypertension Father    Breast cancer Sister 71   Cancer Paternal Grandmother        tumor in spine   Dementia Paternal Grandfather    Allergies  Allergen Reactions   Sulfamethoxazole-Trimethoprim Swelling   Sulfur     Other reaction(s): Unknown   Current Outpatient Medications  Medication Sig Dispense Refill Last Dose   silver nitrate applicators 75-25 % applicator Apply topically 3 (three) times a week for 15 doses. 100 each 0    acetaminophen (TYLENOL) 325 MG tablet Take 650 mg by mouth every 6 (six) hours as needed for mild pain, fever or headache.      clonazePAM (KLONOPIN) 0.5 MG tablet Take 0.5 mg by mouth daily as needed for anxiety.      FLUoxetine (PROZAC) 20 MG capsule Take 40 mg by mouth daily.      Melatonin 5 MG CAPS Take 5 mg by mouth at bedtime as needed (sleep).      metoprolol tartrate (LOPRESSOR) 100 MG tablet Take 1 tablet (100 mg total) by mouth once for 1 dose. 2 hrs prior to CT. 1 tablet 0    rosuvastatin (CRESTOR) 5 MG tablet Take 1 tablet (5 mg total) by mouth daily. 90 tablet 3     No current facility-administered medications for this encounter.   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  Musculoskeletal:  Positive for back pain.  Skin: Negative.   Psychiatric/Behavioral: Negative.    All other systems reviewed and are negative.  Vital signs:  BP 113/81   Pulse 91   Temp 98.2 F (36.8 C) (Oral)   Resp 18   Ht 5\' 6"  (1.676 m)   Wt 75.8 kg   LMP 07/28/2019   BMI 26.95 kg/m  Exam:  Physical Exam Vitals reviewed.  Constitutional:      Appearance: Normal appearance.  Abdominal:     Palpations: Abdomen is soft.  Musculoskeletal:     Comments: Hx back surgery, scars along thoracic spine  Skin:    General: Skin is warm and dry.  Neurological:     Mental Status: She is alert and oriented to person, place, and time.     Stoma type/location:  LLQ colostomy Stomal assessment/size:  oval 3 cm x 4.2 cm, was cutting 1 1/2" ring, (too small)  Peristomal assessment:  3 granulomas along stomal/epidermal junction from 4 to 6 o'clock.  Has been cutting barrier opening round and stoma is oval, the likely cause of the granuloma. I make her a new oval template for cutting her barrier going forward.  She uses a barrier ring also.   Treatment options for stomal/peristomal skin: Today, I apply silver nitrate x 2 applications. I will call her additional sticks in and she can treat these going forward.  We stop the process with NS and I send her home with 3 NS flushes. For pouching, barrier ring and 2 piece pouch, currently wears closed end pouch Output: thick brown stool- takes miralax daily.  Ostomy pouching: 2pc. With barrier ring Education provided:  etiology granulomas, usually from trauma (cutting barrier too small)    She is interested in possible colostomy irrigation.  She will come back to [facility] for an extended session for teaching once granulomas are resolved.     Impression/dx  Granulomas Colostomy  complication Discussion  Silver nitrate application (may repeat at pouch changes as needed until resolved) Possibly consider stomal irrigation for management of stool  Plan  See back as needed, teaching for irrigation or other stomal issues    Visit time: 40 minutes.   Joanna Hudson FNP-BC

## 2022-09-07 NOTE — Discharge Instructions (Signed)
May apply silver nitrate to granulomas at each pouch change (every 3-4 days) until granulomas resolved.  Supplies from [facility], call if assistance needed

## 2022-09-08 NOTE — Addendum Note (Signed)
Addended by: Oleta Mouse on: 09/08/2022 09:52 AM   Modules accepted: Orders

## 2022-09-10 ENCOUNTER — Other Ambulatory Visit: Payer: Managed Care, Other (non HMO) | Admitting: Student

## 2022-09-10 ENCOUNTER — Telehealth: Payer: Self-pay | Admitting: Genetic Counselor

## 2022-09-10 NOTE — Telephone Encounter (Signed)
Patient is aware of upcoming Genetics appointment times/dates

## 2022-09-17 ENCOUNTER — Other Ambulatory Visit (HOSPITAL_COMMUNITY): Payer: Managed Care, Other (non HMO)

## 2022-09-22 ENCOUNTER — Ambulatory Visit (HOSPITAL_COMMUNITY): Payer: Managed Care, Other (non HMO) | Attending: Cardiovascular Disease

## 2022-09-22 ENCOUNTER — Ambulatory Visit (HOSPITAL_COMMUNITY): Payer: Managed Care, Other (non HMO)

## 2022-09-22 DIAGNOSIS — R072 Precordial pain: Secondary | ICD-10-CM | POA: Diagnosis present

## 2022-09-22 LAB — ECHOCARDIOGRAM COMPLETE BUBBLE STUDY
Area-P 1/2: 4.15 cm2
S' Lateral: 2.6 cm

## 2022-09-28 NOTE — Progress Notes (Signed)
Cardiology Office Note:  .   Date:  09/28/2022  ID:  Joanna Yang, DOB 28-Jan-1971, MRN [id number] PCP: [name], MD  [facility] Providers Cardiologist:  [name], MD {  History of Present Illness: .   Joanna Yang is a 52 y.o. female with past medical history of panic attacks and atypical chest pain here for follow-up appointment.  ETT 01/2016 inconclusive with upsloping of 1 mm ST depression resolved 1 minute into recovery, stress echo 02/2016 with sinus tach 2 to 3 mm horizontal to upsloping ST depression at peak stress, normal LVEF recovery and peak stress.  Went to [facility] for second opinion.  Was very anxious about her health at that time.  Multiple mammograms in the same year because she felt lumps.  Symptoms of sharp chest pain only began a few weeks prior to evaluation after initial abnormal ETT where she became worried about a heart attack.  Essentially no risk for CAD and never had chest pain with exertion in the past.  Current symptoms atypical and unlikely to be cardiac in origin.  Sounds as though she had a panic attack 03/12/2016 when she called 911.  We did offer a coronary CTA January 2018 which she declined due to concerns about radiation.  Had hysterectomy in 10/21.  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  Sister with breast cancer.  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  She saw Dr. [name] 03/19/2020 and had atypical chest pain and mild DOE.  Echo ordered for HOCM screening based on her sister.  Echo 2/22 was normal.  In the ED with panic attack and chest pain, CT 12/18/2020 with no PE, 4 mm left lower lobe nodule.  She was last seen April 2023 and wanted to talk about ED visit 12/18/2020 and upcoming surgery.  Patient says D-dimer was [facility] she went to ED which was not seen on our end.  She is on rosuvastatin for cholesterol by PCP.  Walks some but no consistent  exercise.  Joint [employer] and doing light weights.  No regular chest pain other than anxiety.  No exertional chest pain.  Asking to have coronary CTA that was offered in 2018.  LDL 99 down from 146.  Patient very anxious.  She was last seen by me 08/26/2022, she tells me that she never had her coronary CTA because she was worried about radiation.  She was started on Crestor for an LDL of 140.  Now LDL is at goal however, patient is worried about statins and side effects.  We discussed decreasing to 5 mg and repeating a lipid panel in 8 weeks.  She has been experiencing left arm pain for the last 2 weeks that goes down into her fingers and some numbness.  She does have some issues with her neck and shoulder that might be the culprit but since she has a strong family history of coronary disease we discussed a few different tests today.  Offered exercise stress test but ultimately decided on coronary CTA.  She shares that her mother passed away from atrial fibrillation and heart failure and her sister who is 27 years old has HCM.  We also discussed updating a echocardiogram.  Otherwise, patient is doing well today.  She has a monitor on since she was experiencing palpitations.  Today, she***  ROS: Pertinent ROS in HPI  Studies Reviewed: .  Echo 04/11/20 IMPRESSIONS     1. Left ventricular ejection fraction, by estimation, is 65 to 70%. The  left ventricle has normal function. The left ventricle has no regional  wall motion abnormalities. Left ventricular diastolic parameters are  indeterminate.   2. Right ventricular systolic function is normal. The right ventricular  size is normal.   3. The mitral valve is normal in structure. No evidence of mitral valve  regurgitation.   4. The aortic valve is normal in structure. Aortic valve regurgitation is  not visualized.   FINDINGS   Left Ventricle: Left ventricular ejection fraction, by estimation, is 65  to 70%. The left ventricle has normal  function. The left ventricle has no  regional wall motion abnormalities. The left ventricular internal cavity  size was normal in size. There is   no left ventricular hypertrophy. Left ventricular diastolic parameters  are indeterminate.   Right Ventricle: The right ventricular size is normal. Right vetricular  wall thickness was not assessed. Right ventricular systolic function is  normal.   Left Atrium: Left atrial size was normal in size.   Right Atrium: Right atrial size was normal in size.   Pericardium: There is no evidence of pericardial effusion.   Mitral Valve: The mitral valve is normal in structure. No evidence of  mitral valve regurgitation.   Tricuspid Valve: The tricuspid valve is grossly normal. Tricuspid valve  regurgitation is trivial.   Aortic Valve: The aortic valve is normal in structure. Aortic valve  regurgitation is not visualized.   Pulmonic Valve: The pulmonic valve was not well visualized. Pulmonic valve  regurgitation is not visualized.   Aorta: The aortic root and ascending aorta are structurally normal, with  no evidence of dilitation.   IAS/Shunts: No atrial level shunt detected by color flow Doppler.        Physical Exam:   VS:  LMP 07/28/2019    Wt Readings from Last 3 Encounters:  09/07/22 167 lb (75.8 kg)  08/26/22 167 lb 6.4 oz (75.9 kg)  08/13/21 165 lb 3.2 oz (74.9 kg)    GEN: Well nourished, well developed in no acute distress NECK: No JVD; No carotid bruits CARDIAC: RRR, no murmurs, rubs, gallops RESPIRATORY:  Clear to auscultation without rales, wheezing or rhonchi  ABDOMEN: Soft, non-tender, non-distended EXTREMITIES:  No edema; No deformity   ASSESSMENT AND PLAN: .   1.  Anxiety -She is on clonazepam and Prozac  2.  Precordial chest pain -echo for HCM -Sounds more musculoskeletal with pain radiating down left arm and pins-and-needles in her fingers however, will plan for coronary CTA -Discussed possibly driving LDL down  to less than 70 for  risk reduction  3.  Hyperlipidemia -Continue Crestor, decreased to 5 mg daily -Will repeat a lipid panel in 8 weeks to reevaluate  4. Palpitations-three times last month -She is currently wearing a monitor and will mail it in at the end of the week -We are ordering an updated echocardiogram for further evaluation  5. Sleep apnea and mouth piece  -She has been compliant with her CPAP device -She does endorse some gasping for air when she does not wear it      Dispo: She can follow-up in a few weeks after testing  Signed, Sharlene Dory, PA-C

## 2022-09-29 ENCOUNTER — Ambulatory Visit: Payer: Managed Care, Other (non HMO) | Attending: Physician Assistant | Admitting: Physician Assistant

## 2022-09-29 ENCOUNTER — Encounter: Payer: Self-pay | Admitting: Physician Assistant

## 2022-09-29 ENCOUNTER — Ambulatory Visit: Payer: Managed Care, Other (non HMO) | Admitting: Physician Assistant

## 2022-09-29 VITALS — BP 102/78 | HR 85 | Ht 66.0 in | Wt 168.4 lb

## 2022-09-29 DIAGNOSIS — F411 Generalized anxiety disorder: Secondary | ICD-10-CM | POA: Diagnosis not present

## 2022-09-29 DIAGNOSIS — G4733 Obstructive sleep apnea (adult) (pediatric): Secondary | ICD-10-CM

## 2022-09-29 DIAGNOSIS — E785 Hyperlipidemia, unspecified: Secondary | ICD-10-CM | POA: Diagnosis not present

## 2022-09-29 DIAGNOSIS — R072 Precordial pain: Secondary | ICD-10-CM

## 2022-09-29 DIAGNOSIS — R002 Palpitations: Secondary | ICD-10-CM

## 2022-09-29 NOTE — Patient Instructions (Addendum)
Medication Instructions:   Your physician recommends that you continue on your current medications as directed. Please refer to the Current Medication list given to you today.  *If you need a refill on your cardiac medications before your next appointment, please call your pharmacy*   Lab Work:  PLEASE GET LABS FROM YOUR PRIMARY PROVIDER  LFT AND LIPIDS   If you have labs (blood work) drawn today and your tests are completely normal, you will receive your results only by: MyChart Message (if you have MyChart) OR A paper copy in the mail If you have any lab test that is abnormal or we need to change your treatment, we will call you to review the results.   Testing/Procedures:  BEFORE YEAR APPOINTMENT Your physician has requested that you have an echocardiogram. Echocardiography is a painless test that uses sound waves to create images of your heart. It provides your doctor with information about the size and shape of your heart and how well your heart's chambers and valves are working. This procedure takes approximately one hour. There are no restrictions for this procedure. Please do NOT wear cologne, perfume, aftershave, or lotions (deodorant is allowed). Please arrive 15 minutes prior to your appointment time.     Follow-Up: At [facility], you and your health needs are our priority.  As part of our continuing mission to provide you with exceptional heart care, we have created designated Provider Care Teams.  These Care Teams include your primary Cardiologist (physician) and Advanced Practice Providers (APPs -  Physician Assistants and Nurse Practitioners) who all work together to provide you with the care you need, when you need it.  We recommend signing up for the patient portal called "MyChart".  Sign up information is provided on this After Visit Summary.  MyChart is used to connect with patients for Virtual Visits (Telemedicine).  Patients are able to view lab/test results,  encounter notes, upcoming appointments, etc.  Non-urgent messages can be sent to your provider as well.   To learn more about what you can do with MyChart, go to [email or web address].    Your next appointment:   1 year(s)  Provider:  DR. Izora Ribas  Other Instructions  Low-Sodium Eating Plan Salt (sodium) helps you keep a healthy balance of fluids in your body. Too much sodium can raise your blood pressure. It can also cause fluid and waste to be held in your body. Your health care provider or dietitian may recommend a low-sodium eating plan if you have high blood pressure (hypertension), kidney disease, liver disease, or heart failure. Eating less sodium can help lower your blood pressure and reduce swelling. It can also protect your heart, liver, and kidneys. What are tips for following this plan? Reading food labels  Check food labels for the amount of sodium per serving. If you eat more than one serving, you must multiply the listed amount by the number of servings. Choose foods with less than 140 milligrams (mg) of sodium per serving. Avoid foods with 300 mg of sodium or more per serving. Always check how much sodium is in a product, even if the label says "unsalted" or "no salt added." Shopping  Buy products labeled as "low-sodium" or "no salt added." Buy fresh foods. Avoid canned foods and pre-made or frozen meals. Avoid canned, cured, or processed meats. Buy breads that have less than 80 mg of sodium per slice. Cooking  Eat more home-cooked food. Try to eat less restaurant, buffet, and fast food. Try not  to add salt when you cook. Use salt-free seasonings or herbs instead of table salt or sea salt. Check with your provider or pharmacist before using salt substitutes. Cook with plant-based oils, such as canola, sunflower, or olive oil. Meal planning When eating at a restaurant, ask if your food can be made with less salt or no salt. Avoid dishes labeled as brined,  pickled, cured, or smoked. Avoid dishes made with soy sauce, miso, or teriyaki sauce. Avoid foods that have monosodium glutamate (MSG) in them. MSG may be added to some restaurant food, sauces, soups, bouillon, and canned foods. Make meals that can be grilled, baked, poached, roasted, or steamed. These are often made with less sodium. General information Try to limit your sodium intake to 1,500-2,300 mg each day, or the amount told by your provider. What foods should I eat? Fruits Fresh, frozen, or canned fruit. Fruit juice. Vegetables Fresh or frozen vegetables. "No salt added" canned vegetables. "No salt added" tomato sauce and paste. Low-sodium or reduced-sodium tomato and vegetable juice. Grains Low-sodium cereals, such as oats, puffed wheat and rice, and shredded wheat. Low-sodium crackers. Unsalted rice. Unsalted pasta. Low-sodium bread. Whole grain breads and whole grain pasta. Meats and other proteins Fresh or frozen meat, poultry, seafood, and fish. These should have no added salt. Low-sodium canned tuna and salmon. Unsalted nuts. Dried peas, beans, and lentils without added salt. Unsalted canned beans. Eggs. Unsalted nut butters. Dairy Milk. Soy milk. Cheese that is naturally low in sodium, such as ricotta cheese, fresh mozzarella, or Swiss cheese. Low-sodium or reduced-sodium cheese. Cream cheese. Yogurt. Seasonings and condiments Fresh and dried herbs and spices. Salt-free seasonings. Low-sodium mustard and ketchup. Sodium-free salad dressing. Sodium-free light mayonnaise. Fresh or refrigerated horseradish. Lemon juice. Vinegar. Other foods Homemade, reduced-sodium, or low-sodium soups. Unsalted popcorn and pretzels. Low-salt or salt-free chips. The items listed above may not be all the foods and drinks you can have. Talk to a dietitian to learn more. What foods should I avoid? Vegetables Sauerkraut, pickled vegetables, and relishes. Olives. [place] fries. Onion rings. Regular  canned vegetables, except low-sodium or reduced-sodium items. Regular canned tomato sauce and paste. Regular tomato and vegetable juice. Frozen vegetables in sauces. Grains Instant hot cereals. Bread stuffing, pancake, and biscuit mixes. Croutons. Seasoned rice or pasta mixes. Noodle soup cups. Boxed or frozen macaroni and cheese. Regular salted crackers. Self-rising flour. Meats and other proteins Meat or fish that is salted, canned, smoked, spiced, or pickled. Precooked or cured meat, such as sausages or meat loaves. Tomasa Blase. Ham. Pepperoni. Hot dogs. Corned beef. Chipped beef. Salt pork. Jerky. Pickled herring, anchovies, and sardines. Regular canned tuna. Salted nuts. Dairy Processed cheese and cheese spreads. Hard cheeses. Cheese curds. Blue cheese. Feta cheese. String cheese. Regular cottage cheese. Buttermilk. Canned milk. Fats and oils Salted butter. Regular margarine. Ghee. Bacon fat. Seasonings and condiments Onion salt, garlic salt, seasoned salt, table salt, and sea salt. Canned and packaged gravies. Worcestershire sauce. Tartar sauce. Barbecue sauce. Teriyaki sauce. Soy sauce, including reduced-sodium soy sauce. Steak sauce. Fish sauce. Oyster sauce. Cocktail sauce. Horseradish that you find on the shelf. Regular ketchup and mustard. Meat flavorings and tenderizers. Bouillon cubes. Hot sauce. Pre-made or packaged marinades. Pre-made or packaged taco seasonings. Relishes. Regular salad dressings. Salsa. Other foods Salted popcorn and pretzels. Corn chips and puffs. Potato and tortilla chips. Canned or dried soups. Pizza. Frozen entrees and pot pies. The items listed above may not be all the foods and drinks you should avoid. Talk to  a dietitian to learn more. This information is not intended to replace advice given to you by your health care provider. Make sure you discuss any questions you have with your health care provider. Document Revised: 02/26/2022 Document Reviewed:  02/26/2022 Elsevier Patient Education  2024 [employer]  Heart-Healthy Eating Plan Eating a healthy diet is important for the health of your heart. A heart-healthy eating plan includes: Eating less unhealthy fats. Eating more healthy fats. Eating less salt in your food. Salt is also called sodium. Making other changes in your diet. Talk with your doctor or a diet specialist (dietitian) to create an eating plan that is right for you. What is my plan? Your doctor may recommend an eating plan that includes: Total fat: ______% or less of total calories a day. Saturated fat: ______% or less of total calories a day. Cholesterol: less than _________mg a day. Sodium: less than _________mg a day. What are tips for following this plan? Cooking Avoid frying your food. Try to bake, boil, grill, or broil it instead. You can also reduce fat by: Removing the skin from poultry. Removing all visible fats from meats. Steaming vegetables in water or broth. Meal planning  At meals, divide your plate into four equal parts: Fill one-half of your plate with vegetables and green salads. Fill one-fourth of your plate with whole grains. Fill one-fourth of your plate with lean protein foods. Eat 2-4 cups of vegetables per day. One cup of vegetables is: 1 cup (91 g) broccoli or cauliflower florets. 2 medium carrots. 1 large bell pepper. 1 large sweet potato. 1 large tomato. 1 medium white potato. 2 cups (150 g) raw leafy greens. Eat 1-2 cups of fruit per day. One cup of fruit is: 1 small apple 1 large banana 1 cup (237 g) mixed fruit, 1 large orange,  cup (82 g) dried fruit, 1 cup (240 mL) 100% fruit juice. Eat more foods that have soluble fiber. These are apples, broccoli, carrots, beans, peas, and barley. Try to get 20-30 g of fiber per day. Eat 4-5 servings of nuts, legumes, and seeds per week: 1 serving of dried beans or legumes equals  cup (90 g) cooked. 1 serving of nuts is  oz (12  almonds, 24 pistachios, or 7 walnut halves). 1 serving of seeds equals  oz (8 g). General information Eat more home-cooked food. Eat less restaurant, buffet, and fast food. Limit or avoid alcohol. Limit foods that are high in starch and sugar. Avoid fried foods. Lose weight if you are overweight. Keep track of how much salt (sodium) you eat. This is important if you have high blood pressure. Ask your doctor to tell you more about this. Try to add vegetarian meals each week. Fats Choose healthy fats. These include olive oil and canola oil, flaxseeds, walnuts, almonds, and seeds. Eat more omega-3 fats. These include salmon, mackerel, sardines, tuna, flaxseed oil, and ground flaxseeds. Try to eat fish at least 2 times each week. Check food labels. Avoid foods with trans fats or high amounts of saturated fat. Limit saturated fats. These are often found in animal products, such as meats, butter, and cream. These are also found in plant foods, such as palm oil, palm kernel oil, and coconut oil. Avoid foods with partially hydrogenated oils in them. These have trans fats. Examples are stick margarine, some tub margarines, cookies, crackers, and other baked goods. What foods should I eat? Fruits All fresh, canned (in natural juice), or frozen fruits. Vegetables Fresh or frozen  vegetables (raw, steamed, roasted, or grilled). Green salads. Grains Most grains. Choose whole wheat and whole grains most of the time. Rice and pasta, including brown rice and pastas made with whole wheat. Meats and other proteins Lean, well-trimmed beef, veal, pork, and lamb. Chicken and [place] without skin. All fish and shellfish. Wild duck, rabbit, pheasant, and venison. Egg whites or low-cholesterol egg substitutes. Dried beans, peas, lentils, and tofu. Seeds and most nuts. Dairy Low-fat or nonfat cheeses, including ricotta and mozzarella. Skim or 1% milk that is liquid, powdered, or evaporated. Buttermilk that is made  with low-fat milk. Nonfat or low-fat yogurt. Fats and oils Non-hydrogenated (trans-free) margarines. Vegetable oils, including soybean, sesame, sunflower, olive, peanut, safflower, corn, canola, and cottonseed. Salad dressings or mayonnaise made with a vegetable oil. Beverages Mineral water. Coffee and tea. Diet carbonated beverages. Sweets and desserts Sherbet, gelatin, and fruit ice. Small amounts of dark chocolate. Limit all sweets and desserts. Seasonings and condiments All seasonings and condiments. The items listed above may not be a complete list of foods and drinks you can eat. Contact a dietitian for more options. What foods should I avoid? Fruits Canned fruit in heavy syrup. Fruit in cream or butter sauce. Fried fruit. Limit coconut. Vegetables Vegetables cooked in cheese, cream, or butter sauce. Fried vegetables. Grains Breads that are made with saturated or trans fats, oils, or whole milk. Croissants. Sweet rolls. Donuts. High-fat crackers, such as cheese crackers. Meats and other proteins Fatty meats, such as hot dogs, ribs, sausage, bacon, rib-eye roast or steak. High-fat deli meats, such as salami and bologna. Caviar. Domestic duck and goose. Organ meats, such as liver. Dairy Cream, sour cream, cream cheese, and creamed cottage cheese. Whole-milk cheeses. Whole or 2% milk that is liquid, evaporated, or condensed. Whole buttermilk. Cream sauce or high-fat cheese sauce. Yogurt that is made from whole milk. Fats and oils Meat fat, or shortening. Cocoa butter, hydrogenated oils, palm oil, coconut oil, palm kernel oil. Solid fats and shortenings, including bacon fat, salt pork, lard, and butter. Nondairy cream substitutes. Salad dressings with cheese or sour cream. Beverages Regular sodas and juice drinks with added sugar. Sweets and desserts Frosting. Pudding. Cookies. Cakes. Pies. Milk chocolate or white chocolate. Buttered syrups. Full-fat ice cream or ice cream drinks. The  items listed above may not be a complete list of foods and drinks to avoid. Contact a dietitian for more information. Summary Heart-healthy meal planning includes eating less unhealthy fats, eating more healthy fats, and making other changes in your diet. Eat a balanced diet. This includes fruits and vegetables, low-fat or nonfat dairy, lean protein, nuts and legumes, whole grains, and heart-healthy oils and fats. This information is not intended to replace advice given to you by your health care provider. Make sure you discuss any questions you have with your health care provider. Document Revised: 03/17/2021 Document Reviewed: 03/17/2021 Elsevier Patient Education  2024 [employer].

## 2022-10-06 NOTE — Progress Notes (Signed)
52 y.o. G61P2012 Married [place] female here for annual exam.    Having hot flashes.   Decreased libido.  Weight gain.   Taking Prozac and hopes to reduce dosage.   She is lowering her dose of Crestor.  She sees cardiology.  She is due to have her Lipids and LFTs checked and requests this to be done today.   Has increased urination at night.  Wants check of A1C.   She is being seen by urologist at [facility].  She is having urinary hesitancy.   DJD of the neck.   Has discharge from her right breast:  clear coloration, once in the last 3 months.  Has this on the left side in the past but not currently.   Sister has hypertrophic cardiomyopathy.   PCP:   Antony Haste  Patient's last menstrual period was 07/28/2019.           Sexually active: Yes.    The current method of family planning is status post hysterectomy.    Exercising: No.   exercise Smoker:  no  Health Maintenance: Pap:  10/15/17 neg: HR HPV neg, 04/20/14 neg HPV HR neg History of abnormal Pap:  no MMG:  03/04/22 Breast Density Cat B, BI-RADS CAT 1 neg Colonoscopy:  12/30/18 BMD:   n/a  Result  n/a TDaP:  11/20/16 Gardasil:   no HIV: neg in the past Hep C: unsure Screening Labs: today.    reports that she has never smoked. She has never used smokeless tobacco. She reports that she does not drink alcohol and does not use drugs.  Past Medical History:  Diagnosis Date   Anemia    Anxiety    Colonic obstruction (HCC) 12/21/2019   Complication of anesthesia    DDD (degenerative disc disease), cervical    Endometrial polyp 03/22/2019   Family history of breast cancer    Fibroid    GERD (gastroesophageal reflux disease)    Heart murmur    pt. thinks she has a benign murmur dx'd during pregnancy   Mitral regurgitation    a. Prior 2D echo in 2014 showed technically limited study, EF 60%, mild MR, trace TR.   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    Past Surgical History:  Procedure Laterality Date   ABDOMINAL HYSTERECTOMY     ANAL RECTAL MANOMETRY N/A 04/19/2020   Procedure: ANO RECTAL MANOMETRY;  Surgeon: Romie Levee, MD;  Location: [facility];  Service: Endoscopy;  Laterality: N/A;   CESAREAN SECTION  2009   COLECTOMY     COLONOSCOPY     COLOSTOMY     CYSTOSCOPY N/A 12/18/2019   Procedure: CYSTOSCOPY;  Surgeon: Patton Salles, MD;  Location: [facility];  Service: Gynecology;  Laterality: N/A;   ESOPHAGOGASTRODUODENOSCOPY ENDOSCOPY     IUD REMOVAL  07/29/2019   expelling IUD   LAPAROSCOPIC PARASTOMAL HERNIA N/A 08/13/2021   Procedure: LAPAROSCOPIC PRIMARY PARASTOMAL HERNIA REPAIR WITH MESH AND BILATERAL TAP BLOCK;  Surgeon: Karie Soda, MD;  Location: [facility];  Service: General;  Laterality: N/A;   LAPAROTOMY N/A 12/21/2019   Procedure: EXPLORATORY LAPAROTOMY; RESECTION OF DESCENDING AND SIGMOID COLON AND TRANSVERSE COLOSTOMY;  Surgeon: Darnell Level, MD;  Location: [facility];  Service: General;  Laterality: N/A;   LYSIS OF ADHESION N/A 08/13/2021   Procedure: LYSIS OF  ADHESION;  Surgeon: Karie Soda, MD;  Location: [facility];  Service: General;  Laterality: N/A;   TOTAL LAPAROSCOPIC HYSTERECTOMY WITH SALPINGECTOMY N/A 12/18/2019   Procedure: TOTAL LAPAROSCOPIC HYSTERECTOMY WITH SALPINGECTOMY and lysis of adhesions;  Surgeon: Patton Salles, MD;  Location: [facility];  Service: Gynecology;  Laterality: N/A;   TUBAL LIGATION  2009    Current Outpatient Medications  Medication Sig Dispense Refill   acetaminophen (TYLENOL) 325 MG tablet Take 650 mg by mouth every 6 (six) hours as needed for mild pain, fever or headache.     clonazePAM (KLONOPIN) 0.5 MG tablet Take 0.5 mg by mouth daily as needed for anxiety.     FLUoxetine (PROZAC) 20 MG capsule Take 40 mg by mouth daily.     Melatonin 5 MG CAPS Take 5 mg by  mouth at bedtime as needed (sleep).     Polyethylene Glycol 3350 (MIRALAX PO) Take by mouth daily.     rosuvastatin (CRESTOR) 5 MG tablet Take 1 tablet (5 mg total) by mouth daily. 90 tablet 3   No current facility-administered medications for this visit.    Family History  Problem Relation Age of Onset   Hypertension Mother    Arrhythmia Mother    Depression Mother    Heart disease Mother    Heart failure Mother    Diabetes Mother    Hypertension Father    Breast cancer Sister 66   Cardiomyopathy Sister        hypertrophic   Basal cell carcinoma Sister    Cancer Paternal Grandmother        tumor in spine   Dementia Paternal Grandfather     Review of Systems  Constitutional:        Weight gain  HENT: Negative.    Eyes: Negative.   Respiratory: Negative.    Cardiovascular: Negative.   Gastrointestinal: Negative.   Endocrine: Negative.   Genitourinary: Negative.   Musculoskeletal: Negative.   Skin: Negative.   Allergic/Immunologic: Negative.   Neurological: Negative.   Hematological: Negative.   Psychiatric/Behavioral: Negative.      Exam:   BP 108/64   Pulse 92   Ht 5' 4.75" (1.645 m)   Wt 168 lb (76.2 kg)   LMP 07/28/2019   SpO2 99%   BMI 28.17 kg/m     General appearance: alert, cooperative and appears stated age Head: normocephalic, without obvious abnormality, atraumatic Neck: no adenopathy, supple, symmetrical, trachea midline and thyroid normal to inspection and palpation Lungs: clear to auscultation bilaterally Breasts: normal appearance, no masses or tenderness, No nipple retraction or dimpling, No nipple discharge or bleeding, No axillary adenopathy Heart: regular rate and rhythm Abdomen: colostomy present, abdomen is soft, non-tender; no masses, no organomegaly Extremities: extremities normal, atraumatic, no cyanosis or edema Skin: skin color, texture, turgor normal. No rashes or lesions Lymph nodes: cervical, supraclavicular, and axillary nodes  normal. Neurologic: grossly normal  Pelvic: External genitalia:  no lesions              No abnormal inguinal nodes palpated.              Urethra:  normal appearing urethra with no masses, tenderness or lesions              Bartholins and Skenes: normal                 Vagina: normal appearing vagina with normal color and discharge, no lesions  Cervix: absent              Pap taken: no Bimanual Exam:  Uterus:  absent              Adnexa: no mass, fullness, tenderness              Rectal exam: yes.  Confirms.              Anus:  normal sphincter tone, no lesions  Chaperone was present for exam:  Senaida Ores, CMA  Assessment:   Well woman visit with gynecologic exam. Status post laparoscopic hysterectomy with bilateral salpingectomy, cystoscopy.  Megacolon. Status post partial large bowel resection with colostomy.  FH breast cancer in sister.  Personal increased lifetime risk of breast cancer - 27.9%. Right nipple discharge. Menopausal symptoms.  Elevated cholesterol.  Nocturia.   Plan: Mammogram screening discussed. Self breast awareness reviewed. Pap and HR HPV as above. Guidelines for Calcium, Vitamin D, regular exercise program including cardiovascular and weight bearing exercise. Labs:  lipids, A1C, CMP, FSH, E2.  Dx right mammogram and possible [place].  Will do bilateral breast MRI after dx right breast imaging is complete. Follow up annually and prn.

## 2022-10-07 ENCOUNTER — Inpatient Hospital Stay: Payer: Managed Care, Other (non HMO) | Admitting: Genetic Counselor

## 2022-10-07 ENCOUNTER — Inpatient Hospital Stay: Payer: Managed Care, Other (non HMO)

## 2022-10-12 ENCOUNTER — Ambulatory Visit (HOSPITAL_BASED_OUTPATIENT_CLINIC_OR_DEPARTMENT_OTHER): Payer: Self-pay | Admitting: Physical Therapy

## 2022-10-19 ENCOUNTER — Ambulatory Visit (HOSPITAL_BASED_OUTPATIENT_CLINIC_OR_DEPARTMENT_OTHER): Payer: Managed Care, Other (non HMO) | Attending: Family Medicine | Admitting: Physical Therapy

## 2022-10-19 DIAGNOSIS — R278 Other lack of coordination: Secondary | ICD-10-CM | POA: Insufficient documentation

## 2022-10-19 DIAGNOSIS — M6281 Muscle weakness (generalized): Secondary | ICD-10-CM | POA: Insufficient documentation

## 2022-10-19 DIAGNOSIS — M62838 Other muscle spasm: Secondary | ICD-10-CM | POA: Insufficient documentation

## 2022-10-19 NOTE — Therapy (Signed)
OUTPATIENT PHYSICAL THERAPY CERVICAL EVALUATION   Patient Name: Joanna Yang MRN: [id number] DOB:[birth year], 52 y.o., female Today's Date: 10/20/2022  END OF SESSION:  PT End of Session - 10/20/22 0925     Visit Number 1    Number of Visits 6    Date for PT Re-Evaluation 12/01/22    PT Start Time 1145    PT Stop Time 1228    PT Time Calculation (min) 43 min    Activity Tolerance Patient tolerated treatment well    Behavior During Therapy [facility] for tasks assessed/performed           Anemia, anxiety, uterine fibroids, heart murmur, mitral regurgitation, sleep apnea  Past Medical History:  Diagnosis Date   Anemia    Anxiety    Colonic obstruction (HCC) 12/21/2019   Complication of anesthesia    Endometrial polyp 03/22/2019   Family history of breast cancer    Fibroid    GERD (gastroesophageal reflux disease)    Heart murmur    pt. thinks she has a benign murmur dx'd during pregnancy   Mitral regurgitation    a. Prior 2D echo in 2014 showed technically limited study, EF 60%, mild MR, trace TR.   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   Past Surgical History:  Procedure Laterality Date   ABDOMINAL HYSTERECTOMY     ANAL RECTAL MANOMETRY N/A 04/19/2020   Procedure: ANO RECTAL MANOMETRY;  Surgeon: Romie Levee, MD;  Location: [facility];  Service: Endoscopy;  Laterality: N/A;   CESAREAN SECTION  2009   COLECTOMY     COLONOSCOPY     COLOSTOMY     CYSTOSCOPY N/A 12/18/2019   Procedure: CYSTOSCOPY;  Surgeon: Patton Salles, MD;  Location: [facility];  Service: Gynecology;  Laterality: N/A;   ESOPHAGOGASTRODUODENOSCOPY ENDOSCOPY     IUD REMOVAL  07/29/2019   expelling IUD   LAPAROSCOPIC PARASTOMAL HERNIA N/A 08/13/2021   Procedure: LAPAROSCOPIC PRIMARY PARASTOMAL HERNIA REPAIR WITH MESH AND BILATERAL TAP BLOCK;  Surgeon:  Karie Soda, MD;  Location: [facility];  Service: General;  Laterality: N/A;   LAPAROTOMY N/A 12/21/2019   Procedure: EXPLORATORY LAPAROTOMY; RESECTION OF DESCENDING AND SIGMOID COLON AND TRANSVERSE COLOSTOMY;  Surgeon: Darnell Level, MD;  Location: [facility];  Service: General;  Laterality: N/A;   LYSIS OF ADHESION N/A 08/13/2021   Procedure: LYSIS OF ADHESION;  Surgeon: Karie Soda, MD;  Location: [facility];  Service: General;  Laterality: N/A;   TOTAL LAPAROSCOPIC HYSTERECTOMY WITH SALPINGECTOMY N/A 12/18/2019   Procedure: TOTAL LAPAROSCOPIC HYSTERECTOMY WITH SALPINGECTOMY and lysis of adhesions;  Surgeon: Patton Salles, MD;  Location: [facility];  Service: Gynecology;  Laterality: N/A;   TUBAL LIGATION  2009   Patient Active Problem List   Diagnosis Date Noted   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   Gastro-esophageal reflux disease without esophagitis 08/13/2021   Parastomal hernia 08/13/2021   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 04/02/2021   Parastomal hernia without obstruction or gangrene 04/02/2021   Obstructive defecation (HCC) 04/02/2021   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   Left lower lobe pulmonary nodule 01/01/2021   Internal hernia 12/21/2019   Status post surgery 12/18/2019   Encounter for counseling 10/07/2019   Family history of breast cancer    GAD (generalized anxiety disorder)  02/08/2017   Recurrent UTI 12/11/2016   Discomfort in chest 10/29/2016   Chronic fatigue 10/29/2016   Allergic rhinitis due to allergen 10/29/2016   Vitamin D deficiency 10/29/2016    PCP: Dr Antony Haste   REFERRING PROVIDER:  Genelle Bal PA  REFERRING DIAG:  Diagnosis  M54.12 (ICD-10-CM) - Radiculopathy, cervical region  M50.123 (ICD-10-CM) - 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 She had numbness into her hands. She had pain in her 3 fingers. She had a steroid injection.  The injection significantly helped her pain.  Her radicular symptoms have resolved.  When she was in pain her biggest limitation was in driving.  She has a history of low back pain but no significant history of cervical pain.  She also has intermittent numbness in her right hand when she uses her mouse.  Is unclear if this is tied to her neck or may be a bit more of a carpal tunnel type issue.   Hand dominance: Right  PERTINENT HISTORY:  Ostomy bag, anemia, anxiety, Heart murmor   PAIN:  Are you having pain? Yes: NPRS scale: 0/10 Pain location: bilateral upper traps  Pain description: aching  Aggravating factors: driving Relieving factors: finding a comfrtable position   PRECAUTIONS: None  RED FLAGS: None     WEIGHT BEARING RESTRICTIONS: No  FALLS:  Has patient fallen in last 6 months? No  LIVING ENVIRONMENT: Nothing  OCCUPATION:  Not working   Hobbies:  Going to the gym     PLOF: Independent  PATIENT GOALS:  To develop a plan to prevent further exacerbation of pain   NEXT MD VISIT:    OBJECTIVE:   DIAGNOSTIC FINDINGS:  MRI: showed bulging disc at C6- and c7 PATIENT SURVEYS:  FOTO Not given 2nd to just here for HEP   COGNITION: Overall cognitive status: Within functional limits for tasks assessed  SENSATION: Had numbness into the middle 3 fingers on the left. On the right she has some numbness in her hand with use of  the mouse  POSTURE: No Significant postural  limitations  PALPATION:    CERVICAL ROM:   Active ROM A/PROM (deg) eval  Flexion Full  Extension Full  Right lateral flexion   Left lateral flexion   Right rotation Full   Left rotation Full     (Blank rows = not tested)  UPPER EXTREMITY ROM:  Active ROM Right eval Left eval  Shoulder flexion Full without pain Full without pain  Shoulder extension    Shoulder abduction    Shoulder adduction    Shoulder extension    Shoulder internal rotation Full without pain Full without pain  Shoulder external rotation Full without pain Full without pain  Elbow flexion    Elbow extension    Wrist flexion    Wrist extension    Wrist ulnar deviation    Wrist radial deviation    Wrist pronation    Wrist supination     (Blank rows = not tested)  UPPER EXTREMITY MMT:  MMT Right eval Left eval  Shoulder flexion 5 5  Shoulder extension    Shoulder abduction 5 5  Shoulder adduction    Shoulder extension    Shoulder internal rotation    Shoulder external rotation    Middle trapezius    Lower trapezius    Elbow flexion    Elbow extension    Wrist flexion    Wrist extension    Wrist ulnar deviation    Wrist radial deviation    Wrist pronation    Wrist supination    Grip strength     (Blank rows = not tested)   TODAY'S TREATMENT:                                                                                                                              DATE: Access Code: [email or web address] URL: [email or web address] Date: 10/20/2022 Prepared by: Lorayne Bender  Exercises - Seated Cervical Rotation AROM  - 1 x daily - 7 x weekly - 3 sets - 10 reps - Theracane Over Shoulder  - 1 x daily - 7 x weekly - 3 sets - 10 reps - Seated Upper Trapezius Stretch  - 1 x daily - 7 x weekly - 3 sets - 10 reps - Gentle Levator Scapulae Stretch  - 1 x daily - 7 x weekly - 3 sets - 10 reps - Shoulder extension with resistance - Neutral  - 1 x daily - 7 x weekly - 3 sets - 10 reps -  Scapular Retraction with Resistance  - 1 x daily - 7 x weekly - 3 sets - 10 reps   PATIENT EDUCATION:  Education details: HEP, symptom management, POC Person educated: Patient Education method: Explanation, Demonstration, Tactile cues, Verbal cues, and Handouts Education comprehension: verbalized understanding, returned demonstration, verbal cues required, tactile cues required, and needs further education  HOME EXERCISE PROGRAM: Access Code: [email or web address] URL: [email or web address] Date: 10/20/2022 Prepared by: Lorayne Bender  Exercises -  Seated Cervical Rotation AROM  - 1 x daily - 7 x weekly - 3 sets - 10 reps - Theracane Over Shoulder  - 1 x daily - 7 x weekly - 3 sets - 10 reps - Seated Upper Trapezius Stretch  - 1 x daily - 7 x weekly - 3 sets - 10 reps - Gentle Levator Scapulae Stretch  - 1 x daily - 7 x weekly - 3 sets - 10 reps - Shoulder extension with resistance - Neutral  - 1 x daily - 7 x weekly - 3 sets - 10 reps - Scapular Retraction with Resistance  - 1 x daily - 7 x weekly - 3 sets - 10 reps  ASSESSMENT:  CLINICAL IMPRESSION: Patient is a 52 year old female with acute onset of cervical pain with left radicular pain into her left hand.  She had numbness and tingling into her middle 3 fingers.  Since her steroid shots the pain is resolved.  She feels like for the past 2 weeks she has had no pain and is back to full function with her left arm.  At this time she would like to get back into her gym activities.  She would also like a program of her further exacerbation in the future.  She was given base stretches and self soft tissue mobilization today and also given base exercises that will turn into her gym program.  She would benefit from 1-2 more visits to go through gym activities and work on concepts of prevent further exacerbations of pain. OBJECTIVE IMPAIRMENTS: decreased ROM, decreased strength, increased muscle spasms, and pain.   ACTIVITY LIMITATIONS:  carrying, lifting, sleeping, and reach over head with exacerbations   PARTICIPATION LIMITATIONS: driving  PERSONAL FACTORS: Lower back pain  are also affecting patient's functional outcome.   REHAB POTENTIAL: Good  CLINICAL DECISION MAKING: Stable/uncomplicated  EVALUATION COMPLEXITY: Low   GOALS: Goals reviewed with patient? No  STG=LTG  LONG TERM GOALS: Target date: 12/01/2022    Patient will return to gym without pain  Baseline:  Goal status: INITIAL  2.  Patient will have full program to prevent further exacerbation of pain  Baseline:  Goal status: INITIAL  3.  Patient will have no pain while driving  Baseline:  Goal status: INITIAL   PLAN:  PT FREQUENCY: 1x/week  PT DURATION: 6 weeks  PLANNED INTERVENTIONS: Therapeutic exercises, Therapeutic activity, Neuromuscular re-education, Balance training, Gait training, Patient/Family education, Self Care, Joint mobilization, Stair training, DME instructions, Aquatic Therapy, Dry Needling, Electrical stimulation, Cryotherapy, Moist heat, Taping, Manual therapy, and Re-evaluation.   PLAN FOR NEXT SESSION: Assess tolerance to new exercises.  Reviewed gym exercises.  Modified gym program as necessary to prevent further exacerbation of pain.  If patient is symptomatic consider trigger point dry needling manual therapy.   Dessie Coma, PT 10/20/2022, 9:27 AM

## 2022-10-20 ENCOUNTER — Telehealth: Payer: Self-pay | Admitting: Obstetrics and Gynecology

## 2022-10-20 ENCOUNTER — Encounter: Payer: Self-pay | Admitting: Obstetrics and Gynecology

## 2022-10-20 ENCOUNTER — Ambulatory Visit (INDEPENDENT_AMBULATORY_CARE_PROVIDER_SITE_OTHER): Payer: Managed Care, Other (non HMO) | Admitting: Obstetrics and Gynecology

## 2022-10-20 ENCOUNTER — Encounter (HOSPITAL_BASED_OUTPATIENT_CLINIC_OR_DEPARTMENT_OTHER): Payer: Self-pay | Admitting: Physical Therapy

## 2022-10-20 ENCOUNTER — Other Ambulatory Visit: Payer: Self-pay

## 2022-10-20 VITALS — BP 108/64 | HR 92 | Ht 64.75 in | Wt 168.0 lb

## 2022-10-20 DIAGNOSIS — N6452 Nipple discharge: Secondary | ICD-10-CM

## 2022-10-20 DIAGNOSIS — E7800 Pure hypercholesterolemia, unspecified: Secondary | ICD-10-CM

## 2022-10-20 DIAGNOSIS — N951 Menopausal and female climacteric states: Secondary | ICD-10-CM

## 2022-10-20 DIAGNOSIS — Z01419 Encounter for gynecological examination (general) (routine) without abnormal findings: Secondary | ICD-10-CM

## 2022-10-20 DIAGNOSIS — Z9189 Other specified personal risk factors, not elsewhere classified: Secondary | ICD-10-CM

## 2022-10-20 DIAGNOSIS — R351 Nocturia: Secondary | ICD-10-CM | POA: Diagnosis not present

## 2022-10-20 NOTE — Patient Instructions (Signed)

## 2022-10-20 NOTE — Telephone Encounter (Signed)
Please schedule [facility] right mammogram and [facility].   Dx:  right nipple discharge.   Patient will need bilateral breast MRI after completing her diagnostic right breast imaging.  She has increased risk of breast cancer, 27.9%.

## 2022-10-21 ENCOUNTER — Ambulatory Visit: Payer: Managed Care, Other (non HMO) | Attending: Physician Assistant

## 2022-10-21 LAB — LIPID PANEL
Cholesterol: 142 mg/dL (ref <200)
HDL: 53 mg/dL (ref >=50)
LDL Cholesterol (Calc): 65 mg/dL
Non-HDL Cholesterol (Calc): 89 mg/dL (ref <130)
Total CHOL/HDL Ratio: 2.7 (calc) (ref <5.0)
Triglycerides: 164 mg/dL — ABNORMAL HIGH (ref <150)

## 2022-10-21 LAB — COMPREHENSIVE METABOLIC PANEL
AG Ratio: 2 (calc) (ref 1.0–2.5)
ALT: 21 U/L (ref 6–29)
AST: 15 U/L (ref 10–35)
Albumin: 4.5 g/dL (ref 3.6–5.1)
Alkaline phosphatase (APISO): 61 U/L (ref 37–153)
BUN: 11 mg/dL (ref 7–25)
CO2: 27 mmol/L (ref 20–32)
Calcium: 9.4 mg/dL (ref 8.6–10.4)
Chloride: 104 mmol/L (ref 98–110)
Creat: 0.74 mg/dL (ref 0.50–1.03)
Globulin: 2.3 g/dL (ref 1.9–3.7)
Glucose, Bld: 83 mg/dL (ref 65–99)
Potassium: 4.2 mmol/L (ref 3.5–5.3)
Sodium: 138 mmol/L (ref 135–146)
Total Bilirubin: 0.4 mg/dL (ref 0.2–1.2)
Total Protein: 6.8 g/dL (ref 6.1–8.1)

## 2022-10-21 LAB — PROLACTIN: Prolactin: 15.2 ng/mL

## 2022-10-21 LAB — ESTRADIOL: Estradiol: 97 pg/mL

## 2022-10-21 LAB — FOLLICLE STIMULATING HORMONE: FSH: 17.5 m[IU]/mL

## 2022-10-21 LAB — HEMOGLOBIN A1C
Hgb A1c MFr Bld: 5.4 %{Hb} (ref <5.7)
Mean Plasma Glucose: 108 mg/dL
eAG (mmol/L): 6 mmol/L

## 2022-10-21 LAB — TSH: TSH: 1.28 m[IU]/L

## 2022-10-21 NOTE — Telephone Encounter (Signed)
FYI. All orders placed.   Spoke w/ Destiny @ [facility] and got pt scheduled for 10/30/2022 @ 210.   Pt notified and voiced understanding. Will leave encounter open to document when pt gets scheduled for MRI. Directions for scheduling put into the order for MRI to be scheduled after dx breast imaging.

## 2022-10-21 NOTE — Telephone Encounter (Signed)
Thank you for the update!

## 2022-10-23 NOTE — Telephone Encounter (Signed)
MRI scheduled on 11/28/2022. Routing to provider for final review and closing encounter.

## 2022-10-27 ENCOUNTER — Telehealth (HOSPITAL_BASED_OUTPATIENT_CLINIC_OR_DEPARTMENT_OTHER): Payer: Self-pay | Admitting: Physical Therapy

## 2022-10-27 ENCOUNTER — Ambulatory Visit (HOSPITAL_BASED_OUTPATIENT_CLINIC_OR_DEPARTMENT_OTHER): Payer: Managed Care, Other (non HMO) | Attending: Family Medicine | Admitting: Physical Therapy

## 2022-10-27 DIAGNOSIS — M62838 Other muscle spasm: Secondary | ICD-10-CM | POA: Insufficient documentation

## 2022-10-27 DIAGNOSIS — M6281 Muscle weakness (generalized): Secondary | ICD-10-CM | POA: Insufficient documentation

## 2022-10-27 DIAGNOSIS — R278 Other lack of coordination: Secondary | ICD-10-CM | POA: Insufficient documentation

## 2022-10-27 NOTE — Telephone Encounter (Signed)
Called patient regarding no-show visit. The patient reports she was sick. She was advised of her next visit and reports she will be there if she is feeling better.

## 2022-10-29 IMAGING — MR MR BREAST BILAT WO/W CM
8 of 12 series · 33 of 48 positions shown · IV contrast (7ml gadavist)
Comparison: Previous exam(s).

CLINICAL DATA: Increased risk of breast cancer. The patient's
lifetime risk of breast cancer was calculated at 22.9%. Patient's
sister was diagnosed with breast cancer at the age of 48.

LABS:  None obtained at the time of imaging.
EXAM:
BILATERAL BREAST MRI WITH AND WITHOUT CONTRAST
TECHNIQUE: Multiplanar, multisequence MR images of both breasts were obtained
prior to and following the intravenous administration of 7 ml of
Gadavist

[Series 4: t2_tirm_tra ipat (a-p) · axial · 3.0mm · 0.70mm/px · 1 of 55 slices shown]
[im 1/55]
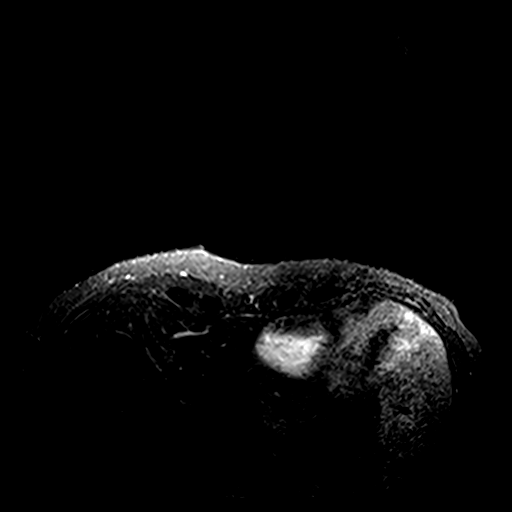

[Series 5: fl3d pre-cm no · axial · non-contrast · 1.2mm · 0.89mm/px · z∈[-118,+54]mm · 5 of 144 slices shown]
[im 1/144]
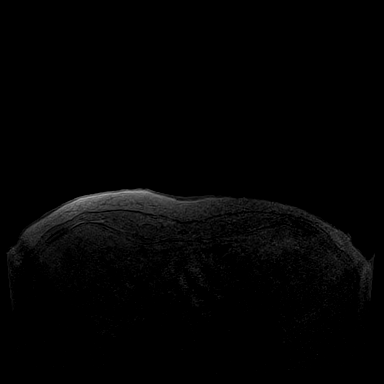
[im 36/144]
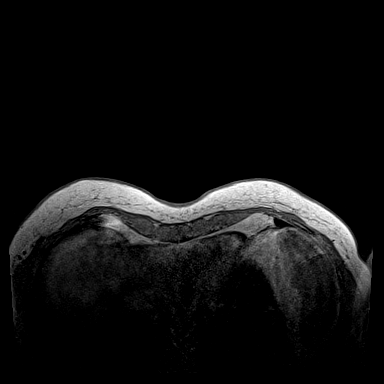
[im 72/144]
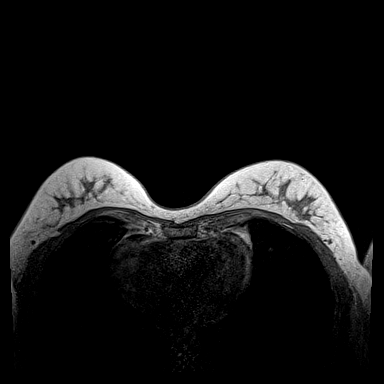
[im 108/144]
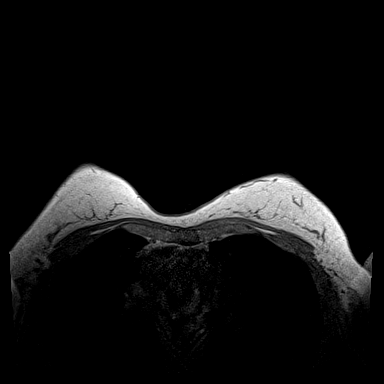
[im 144/144]
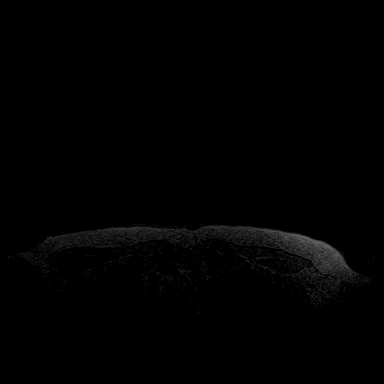

[Series 6: fl3d pre-cm · axial · non-contrast · 1.2mm · 0.89mm/px · z∈[-118,+54]mm · 5 of 144 slices shown]
[im 1/144]
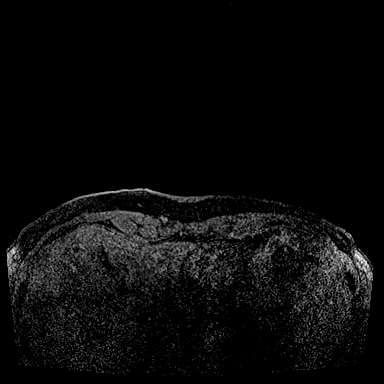
[im 36/144]
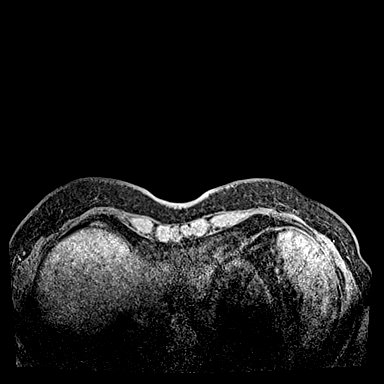
[im 72/144]
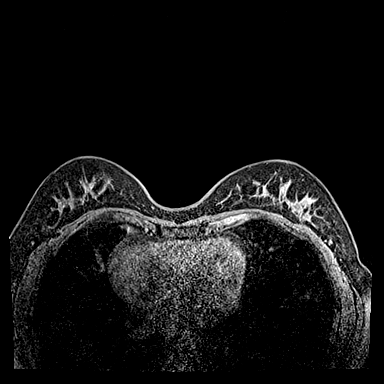
[im 108/144]
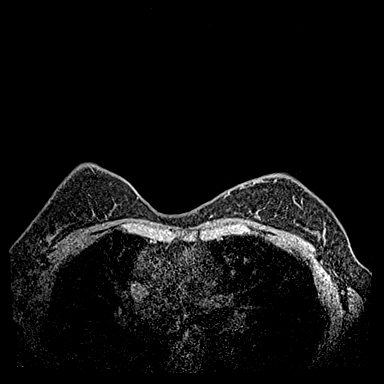
[im 144/144]
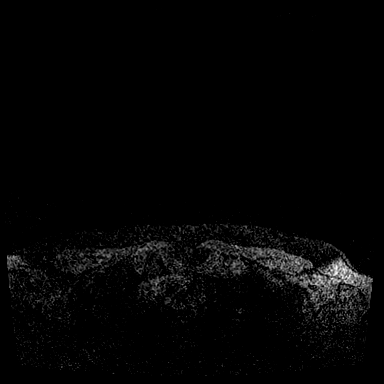

[Series 7: fl3d post-cm 20 · axial · 1.2mm · 0.89mm/px · z∈[-118,+54]mm · 5 of 144 slices shown (1 of 3)]
[im 1/144]
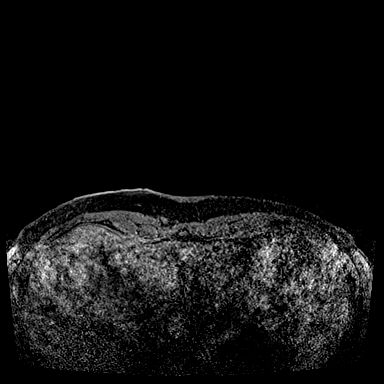
[im 36/144]
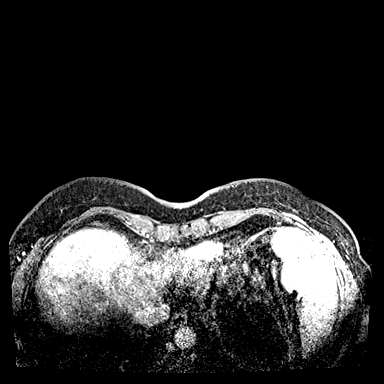
[im 72/144]
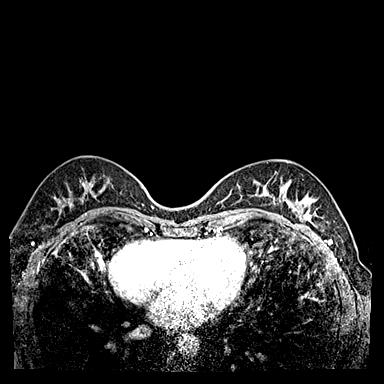
[im 108/144]
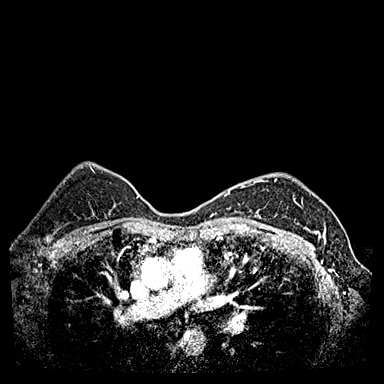
[im 144/144]
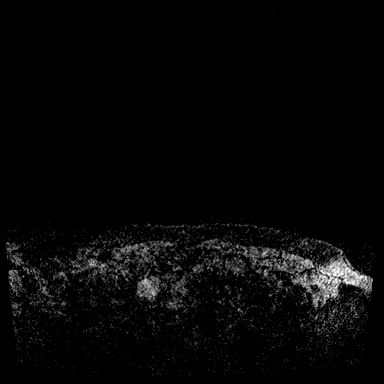

[Series 8: fl3d post-cm 20 · axial · 1.2mm · 0.89mm/px · z∈[-118,+54]mm · 5 of 144 slices shown (2 of 3)]
[im 1/144]
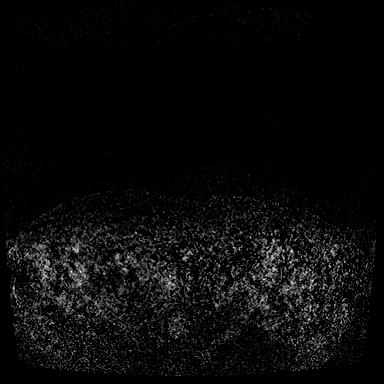
[im 36/144]
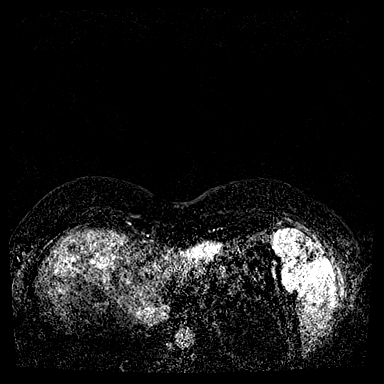
[im 72/144]
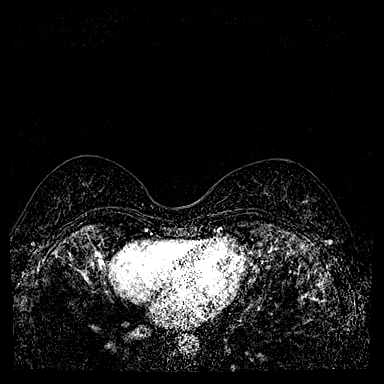
[im 108/144]
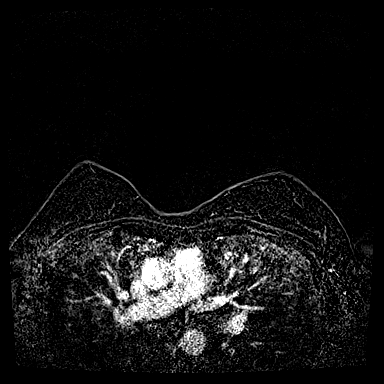
[im 144/144]
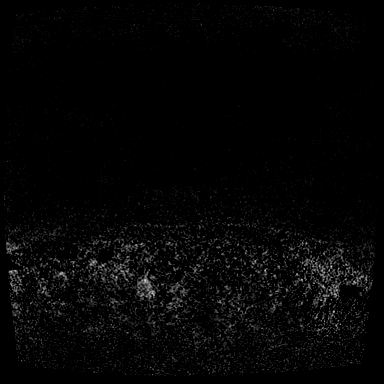

[Series 9: fl3d post-cm 20 · axial · 172.8mm · 0.89mm/px · 1 of 1 slices shown (3 of 3)]
[im 1/1]
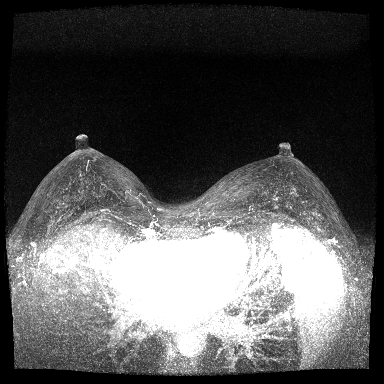

[Series 10: fl3d post-cm 3 · axial · 1.2mm · 0.89mm/px · z∈[-118,+54]mm · 6 of 144 slices shown (1 of 2)]
[im 1/144]
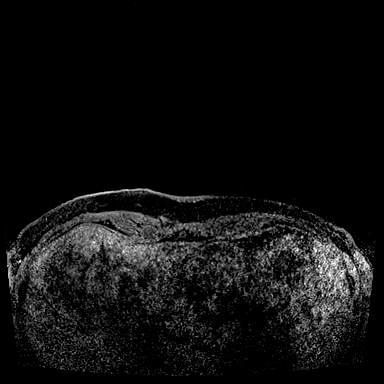
[im 29/144]
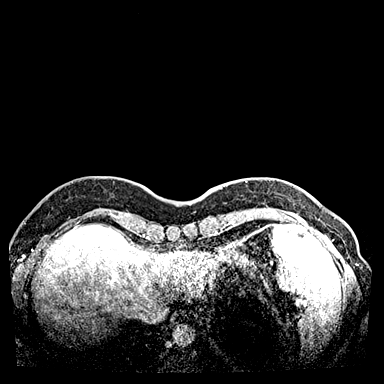
[im 58/144]
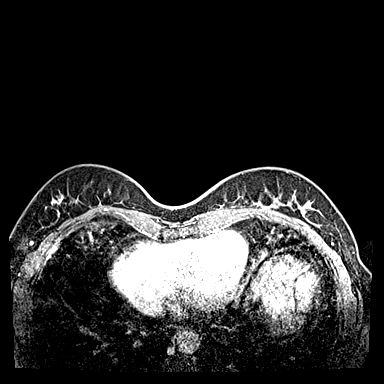
[im 86/144]
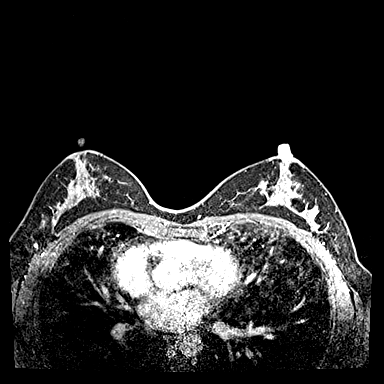
[im 115/144]
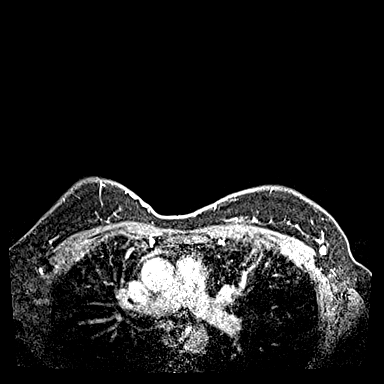
[im 144/144]
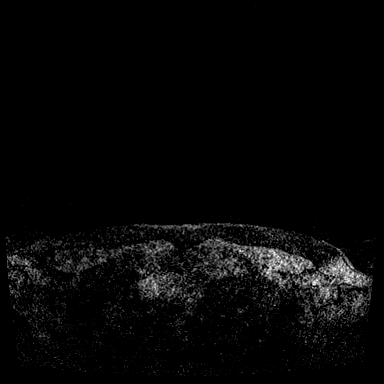

[Series 11: fl3d post-cm 3 · axial · 1.2mm · 0.89mm/px · z∈[-118,+19]mm · 5 of 144 slices shown (2 of 2)]
[im 1/144]
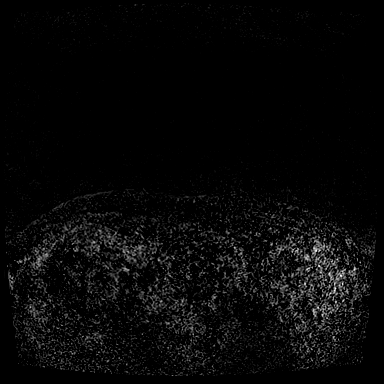
[im 29/144]
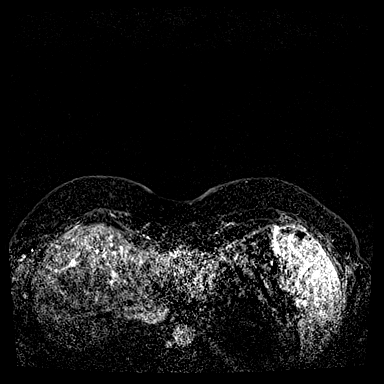
[im 58/144]
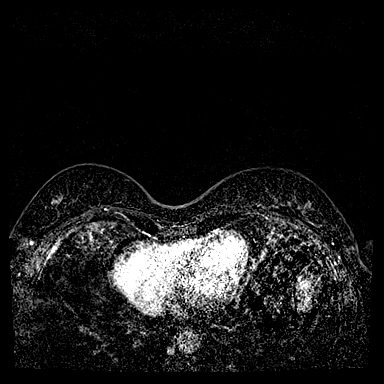
[im 86/144]
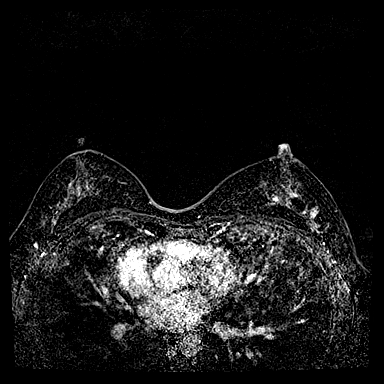
[im 115/144]
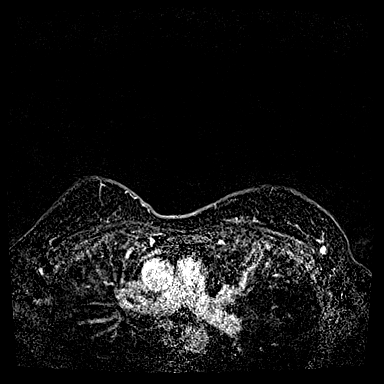

[33 of 48 positions shown; findings below may reference images not displayed]

Three-dimensional MR images were rendered by post-processing of the
original MR data on an independent workstation. The
three-dimensional MR images were interpreted, and findings are
reported in the following complete MRI report for this study. Three
dimensional images were evaluated at the independent interpreting
workstation using the DynaCAD thin client.
FINDINGS: Breast composition: c. Heterogeneous fibroglandular tissue.

Background parenchymal enhancement: Moderate.

Right breast: No mass or abnormal enhancement.

Left breast: No mass or abnormal enhancement.

Lymph nodes: No abnormal appearing lymph nodes.

Ancillary findings:  None.
IMPRESSION: No abnormal enhancement in either breast.

RECOMMENDATION:
Bilateral screening mammogram in Thursday September, 2021 is recommended.

The American Cancer Society recommends annual MRI and mammography in
patients with an estimated lifetime risk of developing breast cancer
greater than 20 - 25%, or who are known or suspected to be positive
for the breast cancer gene.

BI-RADS CATEGORY  1: Negative.

## 2022-10-30 ENCOUNTER — Ambulatory Visit
Admission: RE | Admit: 2022-10-30 | Discharge: 2022-10-30 | Disposition: A | Discharge status: Home / Self Care | Payer: Managed Care, Other (non HMO) | Source: Ambulatory Visit | Attending: Obstetrics and Gynecology | Admitting: Obstetrics and Gynecology

## 2022-10-30 DIAGNOSIS — N6452 Nipple discharge: Secondary | ICD-10-CM

## 2022-11-02 ENCOUNTER — Ambulatory Visit (HOSPITAL_COMMUNITY)
Admission: RE | Admit: 2022-11-02 | Discharge: 2022-11-02 | Disposition: A | Discharge status: Home / Self Care | Payer: Managed Care, Other (non HMO) | Source: Ambulatory Visit | Attending: Plastic Surgery | Admitting: Plastic Surgery

## 2022-11-02 DIAGNOSIS — K6389 Other specified diseases of intestine: Secondary | ICD-10-CM | POA: Diagnosis not present

## 2022-11-02 DIAGNOSIS — Y833 Surgical operation with formation of external stoma as the cause of abnormal reaction of the patient, or of later complication, without mention of misadventure at the time of the procedure: Secondary | ICD-10-CM | POA: Insufficient documentation

## 2022-11-02 DIAGNOSIS — L928 Other granulomatous disorders of the skin and subcutaneous tissue: Secondary | ICD-10-CM | POA: Insufficient documentation

## 2022-11-02 DIAGNOSIS — K9400 Colostomy complication, unspecified: Secondary | ICD-10-CM | POA: Diagnosis not present

## 2022-11-02 DIAGNOSIS — K9409 Other complications of colostomy: Secondary | ICD-10-CM | POA: Diagnosis not present

## 2022-11-02 DIAGNOSIS — Z933 Colostomy status: Secondary | ICD-10-CM | POA: Diagnosis present

## 2022-11-02 MED ORDER — SILVER NITRATE-POT NITRATE 75-25 % EX MISC
1.0000 | Freq: Once | CUTANEOUS | Status: DC
  Filled 2022-11-02: qty 1

## 2022-11-02 NOTE — Progress Notes (Signed)
[facility]   Reason for visit:  LLQ colostomy with granulomas present to stoma.  Unable to get silver nitrate sticks covered by insurance to apply at home.  HPI:  Toxic megacolon with colostomy Past Medical History:  Diagnosis Date  . Anemia   . Anxiety   . Colonic obstruction (HCC) 12/21/2019  . Complication of anesthesia   . DDD (degenerative disc disease), cervical   . Endometrial polyp 03/22/2019  . Family history of breast cancer   . Fibroid   . GERD (gastroesophageal reflux disease)   . Heart murmur    pt. thinks she has a benign murmur dx'd during pregnancy  . Mitral regurgitation    a. Prior 2D echo in 2014 showed technically limited study, EF 60%, mild MR, trace TR.  Marland Kitchen Pneumonia   . PONV (postoperative nausea and vomiting)   . Right ovarian cyst 07/29/2019  . Sleep apnea    Uses a mouth guard  . 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 Hypertension Mother   . Arrhythmia Mother   . Depression Mother   . Heart disease Mother   . Heart failure Mother   . Diabetes Mother   . Hypertension Father   . Breast cancer Sister 2  . Cardiomyopathy Sister        hypertrophic  . Basal cell carcinoma Sister   . Cancer Paternal Grandmother        tumor in spine  . Dementia Paternal Grandfather    Allergies  Allergen Reactions  . Sulfamethoxazole-Trimethoprim Swelling  . Sulfur     Other reaction(s): Unknown   Current Outpatient Medications  Medication Sig Dispense Refill Last Dose  . acetaminophen (TYLENOL) 325 MG tablet Take 650 mg by mouth every 6 (six) hours as needed for mild pain, fever or headache.     . clonazePAM (KLONOPIN) 0.5 MG tablet Take 0.5 mg by mouth daily as needed for anxiety.     Marland Kitchen FLUoxetine (PROZAC) 20 MG capsule Take 40 mg by mouth daily.     . Melatonin 5 MG CAPS Take 5 mg by mouth at bedtime as needed (sleep).     . Polyethylene Glycol 3350 (MIRALAX PO) Take by mouth daily.     .  rosuvastatin (CRESTOR) 5 MG tablet Take 1 tablet (5 mg total) by mouth daily. 90 tablet 3    No current facility-administered medications for this encounter.   ROS  Review of Systems  Gastrointestinal:        LLQ colostomy Granulomas to stoma  Skin: Negative.   All other systems reviewed and are negative. Vital signs:  LMP 07/28/2019  Exam:  Physical Exam Constitutional:      Appearance: Normal appearance.  Abdominal:     Palpations: Abdomen is soft.  Neurological:     Mental Status: She is alert and oriented to person, place, and time.  Psychiatric:        Mood and Affect: Mood normal.        Behavior: Behavior normal.    Stoma type/location:  LLQ colostomy Stomal assessment/size:  oval pink and moist  3 granulomas present along lower half.   Peristomal assessment:  intact Treatment options for stomal/peristomal skin: I apply silver nitrate to 3 raised granulomas.  Irrigated with NS.   Output: soft brown stool Ostomy pouching:  Pouch, barrier ring.   Education provided:  states she is considering irrigation and may schedule an appointment for teaching.  I send her home with additional silver nitrate to treat 2 more times.     Impression/dx  Granulomas colostomy Discussion  See above Plan  See back as needed.     Visit time: 45 minutes.   Maple Hudson FNP-BC

## 2022-11-03 ENCOUNTER — Encounter (HOSPITAL_BASED_OUTPATIENT_CLINIC_OR_DEPARTMENT_OTHER): Payer: Self-pay | Admitting: Physical Therapy

## 2022-11-03 ENCOUNTER — Ambulatory Visit (HOSPITAL_BASED_OUTPATIENT_CLINIC_OR_DEPARTMENT_OTHER): Payer: Managed Care, Other (non HMO) | Admitting: Physical Therapy

## 2022-11-03 DIAGNOSIS — M62838 Other muscle spasm: Secondary | ICD-10-CM | POA: Diagnosis present

## 2022-11-03 DIAGNOSIS — M6281 Muscle weakness (generalized): Secondary | ICD-10-CM

## 2022-11-03 DIAGNOSIS — R278 Other lack of coordination: Secondary | ICD-10-CM | POA: Diagnosis present

## 2022-11-03 NOTE — Therapy (Signed)
OUTPATIENT PHYSICAL THERAPY CERVICAL EVALUATION   Patient Name: Joanna Yang MRN: [id number] DOB:[birth year], 52 y.o., female Today's Date: 11/03/2022  END OF SESSION:  Anemia, anxiety, uterine fibroids, heart murmur, mitral regurgitation, sleep apnea  Past Medical History:  Diagnosis Date   Anemia    Anxiety    Colonic obstruction (HCC) 12/21/2019   Complication of anesthesia    DDD (degenerative disc disease), cervical    Endometrial polyp 03/22/2019   Family history of breast cancer    Fibroid    GERD (gastroesophageal reflux disease)    Heart murmur    pt. thinks she has a benign murmur dx'd during pregnancy   Mitral regurgitation    a. Prior 2D echo in 2014 showed technically limited study, EF 60%, mild MR, trace TR.   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   Past Surgical History:  Procedure Laterality Date   ABDOMINAL HYSTERECTOMY     ANAL RECTAL MANOMETRY N/A 04/19/2020   Procedure: ANO RECTAL MANOMETRY;  Surgeon: Romie Levee, MD;  Location: [facility];  Service: Endoscopy;  Laterality: N/A;   CESAREAN SECTION  2009   COLECTOMY     COLONOSCOPY     COLOSTOMY     CYSTOSCOPY N/A 12/18/2019   Procedure: CYSTOSCOPY;  Surgeon: Patton Salles, MD;  Location: [facility];  Service: Gynecology;  Laterality: N/A;   ESOPHAGOGASTRODUODENOSCOPY ENDOSCOPY     IUD REMOVAL  07/29/2019   expelling IUD   LAPAROSCOPIC PARASTOMAL HERNIA N/A 08/13/2021   Procedure: LAPAROSCOPIC PRIMARY PARASTOMAL HERNIA REPAIR WITH MESH AND BILATERAL TAP BLOCK;  Surgeon: Karie Soda, MD;  Location: [facility];  Service: General;  Laterality: N/A;   LAPAROTOMY N/A 12/21/2019   Procedure: EXPLORATORY LAPAROTOMY; RESECTION OF DESCENDING AND SIGMOID COLON AND TRANSVERSE COLOSTOMY;  Surgeon: Darnell Level, MD;  Location: [facility];  Service: General;   Laterality: N/A;   LYSIS OF ADHESION N/A 08/13/2021   Procedure: LYSIS OF ADHESION;  Surgeon: Karie Soda, MD;  Location: [facility];  Service: General;  Laterality: N/A;   TOTAL LAPAROSCOPIC HYSTERECTOMY WITH SALPINGECTOMY N/A 12/18/2019   Procedure: TOTAL LAPAROSCOPIC HYSTERECTOMY WITH SALPINGECTOMY and lysis of adhesions;  Surgeon: Patton Salles, MD;  Location: [facility];  Service: Gynecology;  Laterality: N/A;   TUBAL LIGATION  2009   Patient Active Problem List   Diagnosis Date Noted   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   Gastro-esophageal reflux disease without esophagitis 08/13/2021   Parastomal hernia 08/13/2021   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 04/02/2021   Parastomal hernia without obstruction or gangrene 04/02/2021   Obstructive defecation (HCC) 04/02/2021   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   Left lower lobe pulmonary nodule 01/01/2021   Internal hernia 12/21/2019   Status post surgery 12/18/2019   Encounter for counseling 10/07/2019   Family history of breast cancer    GAD (generalized anxiety disorder) 02/08/2017   Recurrent UTI 12/11/2016   Discomfort in chest 10/29/2016   Chronic fatigue 10/29/2016   Allergic rhinitis due to allergen 10/29/2016   Vitamin D deficiency 10/29/2016    PCP: Dr Antony Haste   REFERRING PROVIDER:  Genelle Bal PA  REFERRING DIAG:  Diagnosis  M54.12 (ICD-10-CM) - Radiculopathy, cervical region  M50.123 (ICD-10-CM) - Cervical disc disorder at C6-C7 level with radiculopathy  THERAPY DIAG:  No diagnosis found.  Rationale for Evaluation and Treatment: Rehabilitation  ONSET DATE: > 1 month prior   SUBJECTIVE:                                                                                                                                                                                                         SUBJECTIVE STATEMENT: The patient reports she  has had ome tightness in her left upper trap over the past week or so It is not really painful but she can feel it.    The patient had an insidious onset of neck and radicular pain down her left arm. She had numbness into her hands. She had pain in her 3 fingers. She had a steroid injection.  The injection significantly helped her pain.  Her radicular symptoms have resolved.  When she was in pain her biggest limitation was in driving.  She has a history of low back pain but no significant history of cervical pain.  She also has intermittent numbness in her right hand when she uses her mouse.  Is unclear if this is tied to her neck or may be a bit more of a carpal tunnel type issue.   Hand dominance: Right  PERTINENT HISTORY:  Ostomy bag, anemia, anxiety, Heart murmor   PAIN:  Are you having pain? Yes: NPRS scale: 0/10 Pain location: bilateral upper traps  Pain description: aching  Aggravating factors: driving Relieving factors: finding a comfrtable position   PRECAUTIONS: None  RED FLAGS: None     WEIGHT BEARING RESTRICTIONS: No  FALLS:  Has patient fallen in last 6 months? No  LIVING ENVIRONMENT: Nothing  OCCUPATION:  Not working   Hobbies:  Going to the gym     PLOF: Independent  PATIENT GOALS:  To develop a plan to prevent further exacerbation of pain   NEXT MD VISIT:    OBJECTIVE:   DIAGNOSTIC FINDINGS:  MRI: showed bulging disc at C6- and c7 PATIENT SURVEYS:  FOTO Not given 2nd to just here for HEP   COGNITION: Overall cognitive status: Within functional limits for tasks assessed  SENSATION: Had numbness into the middle 3 fingers on the left. On the right she has some numbness in her hand with use of the mouse  POSTURE: No Significant postural limitations  PALPATION:    CERVICAL ROM:   Active ROM A/PROM (deg) eval  Flexion Full  Extension Full  Right lateral flexion   Left lateral flexion   Right rotation Full   Left rotation Full      (  Blank rows = not tested)  UPPER EXTREMITY ROM:  Active ROM Right eval Left eval  Shoulder flexion Full without pain Full without pain  Shoulder extension    Shoulder abduction    Shoulder adduction    Shoulder extension    Shoulder internal rotation Full without pain Full without pain  Shoulder external rotation Full without pain Full without pain  Elbow flexion    Elbow extension    Wrist flexion    Wrist extension    Wrist ulnar deviation    Wrist radial deviation    Wrist pronation    Wrist supination     (Blank rows = not tested)  UPPER EXTREMITY MMT:  MMT Right eval Left eval  Shoulder flexion 5 5  Shoulder extension    Shoulder abduction 5 5  Shoulder adduction    Shoulder extension    Shoulder internal rotation    Shoulder external rotation    Middle trapezius    Lower trapezius    Elbow flexion    Elbow extension    Wrist flexion    Wrist extension    Wrist ulnar deviation    Wrist radial deviation    Wrist pronation    Wrist supination    Grip strength     (Blank rows = not tested)   TODAY'S TREATMENT:                                                                                                                              DATE:  9/10 Manual: trigger point release to cervical spine and upper trap;   Gym:  Row machine 15 lbs 2x10  LF row 2x15 15 lbs  Lat pull down cables x10 15 lbs  Biceps curl x15 Cybex abdominal crunch machine x 10  Reviewed RPE and how to scale her exercises.    Eval:Access Code: [email or web address] URL: [email or web address] Date: 10/20/2022 Prepared by: Lorayne Bender  Exercises - Seated Cervical Rotation AROM  - 1 x daily - 7 x weekly - 3 sets - 10 reps - Theracane Over Shoulder  - 1 x daily - 7 x weekly - 3 sets - 10 reps - Seated Upper Trapezius Stretch  - 1 x daily - 7 x weekly - 3 sets - 10 reps - Gentle Levator Scapulae Stretch  - 1 x daily - 7 x weekly - 3 sets - 10 reps - Shoulder extension with  resistance - Neutral  - 1 x daily - 7 x weekly - 3 sets - 10 reps - Scapular Retraction with Resistance  - 1 x daily - 7 x weekly - 3 sets - 10 reps   PATIENT EDUCATION:  Education details: HEP, symptom management, POC Person educated: Patient Education method: Explanation, Demonstration, Tactile cues, Verbal cues, and Handouts Education comprehension: verbalized understanding, returned demonstration, verbal cues required, tactile cues required, and needs further education  HOME EXERCISE PROGRAM: Access Code: [email or web address] URL: [email or web address] Date: 10/20/2022 Prepared by:  Lorayne Bender  Exercises - Seated Cervical Rotation AROM  - 1 x daily - 7 x weekly - 3 sets - 10 reps - Theracane Over Shoulder  - 1 x daily - 7 x weekly - 3 sets - 10 reps - Seated Upper Trapezius Stretch  - 1 x daily - 7 x weekly - 3 sets - 10 reps - Gentle Levator Scapulae Stretch  - 1 x daily - 7 x weekly - 3 sets - 10 reps - Shoulder extension with resistance - Neutral  - 1 x daily - 7 x weekly - 3 sets - 10 reps - Scapular Retraction with Resistance  - 1 x daily - 7 x weekly - 3 sets - 10 reps  ASSESSMENT:  CLINICAL IMPRESSION: Patient tolerated treatment well.  She did have minor tightness in her upper trap today.  Therapy worked on soft tissue mobilization to reduce tightness in the left upper trap.  We also reviewed self soft tissue mobilization using a Thera cane.  We reviewed her self stretches for home.  We also worked on her gym program.  She reviewed the exercises she normally does for gym program.  She did well. She had no tightening of the trap.   Eval:Patient is a 52 year old female with acute onset of cervical pain with left radicular pain into her left hand.  She had numbness and tingling into her middle 3 fingers.  Since her steroid shots the pain is resolved.  She feels like for the past 2 weeks she has had no pain and is back to full function with her left arm.  At this time she  would like to get back into her gym activities.  She would also like a program of her further exacerbation in the future.  She was given base stretches and self soft tissue mobilization today and also given base exercises that will turn into her gym program.  She would benefit from 1-2 more visits to go through gym activities and work on concepts of prevent further exacerbations of pain. OBJECTIVE IMPAIRMENTS: decreased ROM, decreased strength, increased muscle spasms, and pain.   ACTIVITY LIMITATIONS: carrying, lifting, sleeping, and reach over head with exacerbations   PARTICIPATION LIMITATIONS: driving  PERSONAL FACTORS: Lower back pain  are also affecting patient's functional outcome.   REHAB POTENTIAL: Good  CLINICAL DECISION MAKING: Stable/uncomplicated  EVALUATION COMPLEXITY: Low   GOALS: Goals reviewed with patient? No  STG=LTG  LONG TERM GOALS: Target date: 12/01/2022    Patient will return to gym without pain  Baseline:  Goal status: INITIAL  2.  Patient will have full program to prevent further exacerbation of pain  Baseline:  Goal status: INITIAL  3.  Patient will have no pain while driving  Baseline:  Goal status: INITIAL   PLAN:  PT FREQUENCY: 1x/week  PT DURATION: 6 weeks  PLANNED INTERVENTIONS: Therapeutic exercises, Therapeutic activity, Neuromuscular re-education, Balance training, Gait training, Patient/Family education, Self Care, Joint mobilization, Stair training, DME instructions, Aquatic Therapy, Dry Needling, Electrical stimulation, Cryotherapy, Moist heat, Taping, Manual therapy, and Re-evaluation.   PLAN FOR NEXT SESSION: Assess tolerance to new exercises.  Reviewed gym exercises.  Modified gym program as necessary to prevent further exacerbation of pain.  If patient is symptomatic consider trigger point dry needling manual therapy.   Dessie Coma, PT 11/03/2022, 10:16 AM

## 2022-11-05 NOTE — Discharge Instructions (Signed)
May repeat silver nitrate applications twice.  Return as needed.  Ensure that barrier is cut large enough.

## 2022-11-18 ENCOUNTER — Encounter (HOSPITAL_BASED_OUTPATIENT_CLINIC_OR_DEPARTMENT_OTHER): Payer: Self-pay | Admitting: Physical Therapy

## 2022-11-18 ENCOUNTER — Ambulatory Visit (HOSPITAL_BASED_OUTPATIENT_CLINIC_OR_DEPARTMENT_OTHER): Payer: Managed Care, Other (non HMO) | Admitting: Physical Therapy

## 2022-11-18 DIAGNOSIS — R278 Other lack of coordination: Secondary | ICD-10-CM

## 2022-11-18 DIAGNOSIS — M6281 Muscle weakness (generalized): Secondary | ICD-10-CM

## 2022-11-18 DIAGNOSIS — M62838 Other muscle spasm: Secondary | ICD-10-CM

## 2022-11-18 NOTE — Therapy (Signed)
OUTPATIENT PHYSICAL THERAPY CERVICAL EVALUATION   Patient Name: Joanna Yang MRN: [id number] DOB:[birth year], 52 y.o., female Today's Date: 11/18/2022  END OF SESSION:  Anemia, anxiety, uterine fibroids, heart murmur, mitral regurgitation, sleep apnea  Past Medical History:  Diagnosis Date   Anemia    Anxiety    Colonic obstruction (HCC) 12/21/2019   Complication of anesthesia    DDD (degenerative disc disease), cervical    Endometrial polyp 03/22/2019   Family history of breast cancer    Fibroid    GERD (gastroesophageal reflux disease)    Heart murmur    pt. thinks she has a benign murmur dx'd during pregnancy   Mitral regurgitation    a. Prior 2D echo in 2014 showed technically limited study, EF 60%, mild MR, trace TR.   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   Past Surgical History:  Procedure Laterality Date   ABDOMINAL HYSTERECTOMY     ANAL RECTAL MANOMETRY N/A 04/19/2020   Procedure: ANO RECTAL MANOMETRY;  Surgeon: Romie Levee, MD;  Location: [facility];  Service: Endoscopy;  Laterality: N/A;   CESAREAN SECTION  2009   COLECTOMY     COLONOSCOPY     COLOSTOMY     CYSTOSCOPY N/A 12/18/2019   Procedure: CYSTOSCOPY;  Surgeon: Patton Salles, MD;  Location: [facility];  Service: Gynecology;  Laterality: N/A;   ESOPHAGOGASTRODUODENOSCOPY ENDOSCOPY     IUD REMOVAL  07/29/2019   expelling IUD   LAPAROSCOPIC PARASTOMAL HERNIA N/A 08/13/2021   Procedure: LAPAROSCOPIC PRIMARY PARASTOMAL HERNIA REPAIR WITH MESH AND BILATERAL TAP BLOCK;  Surgeon: Karie Soda, MD;  Location: [facility];  Service: General;  Laterality: N/A;   LAPAROTOMY N/A 12/21/2019   Procedure: EXPLORATORY LAPAROTOMY; RESECTION OF DESCENDING AND SIGMOID COLON AND TRANSVERSE COLOSTOMY;  Surgeon: Darnell Level, MD;  Location: [facility];  Service: General;   Laterality: N/A;   LYSIS OF ADHESION N/A 08/13/2021   Procedure: LYSIS OF ADHESION;  Surgeon: Karie Soda, MD;  Location: [facility];  Service: General;  Laterality: N/A;   TOTAL LAPAROSCOPIC HYSTERECTOMY WITH SALPINGECTOMY N/A 12/18/2019   Procedure: TOTAL LAPAROSCOPIC HYSTERECTOMY WITH SALPINGECTOMY and lysis of adhesions;  Surgeon: Patton Salles, MD;  Location: [facility];  Service: Gynecology;  Laterality: N/A;   TUBAL LIGATION  2009   Patient Active Problem List   Diagnosis Date Noted   Granuloma, 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   Gastro-esophageal reflux disease without esophagitis 08/13/2021   Parastomal hernia 08/13/2021   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 04/02/2021   Parastomal hernia without obstruction or gangrene 04/02/2021   Obstructive defecation (HCC) 04/02/2021   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   Left lower lobe pulmonary nodule 01/01/2021   Internal hernia 12/21/2019   Status post surgery 12/18/2019   Encounter for counseling 10/07/2019   Family history of breast cancer    GAD (generalized anxiety disorder) 02/08/2017   Recurrent UTI 12/11/2016   Discomfort in chest 10/29/2016   Chronic fatigue 10/29/2016   Allergic rhinitis due to allergen 10/29/2016   Vitamin D deficiency 10/29/2016    PCP: Dr Antony Haste   REFERRING PROVIDER:  Genelle Bal PA  REFERRING DIAG:  Diagnosis  M54.12 (ICD-10-CM) - Radiculopathy, cervical region  M50.123 (ICD-10-CM) - Cervical disc disorder at C6-C7 level with radiculopathy    THERAPY  DIAG:  No diagnosis found.  Rationale for Evaluation and Treatment: Rehabilitation  ONSET DATE: > 1 month prior   SUBJECTIVE:                                                                                                                                                                                                         SUBJECTIVE STATEMENT: The patient rpeorts no  pain, just some tightness. She has been sick. She hasn't been able to work out.   The patient had an insidious onset of neck and radicular pain down her left arm. She had numbness into her hands. She had pain in her 3 fingers. She had a steroid injection.  The injection significantly helped her pain.  Her radicular symptoms have resolved.  When she was in pain her biggest limitation was in driving.  She has a history of low back pain but no significant history of cervical pain.  She also has intermittent numbness in her right hand when she uses her mouse.  Is unclear if this is tied to her neck or may be a bit more of a carpal tunnel type issue.   Hand dominance: Right  PERTINENT HISTORY:  Ostomy bag, anemia, anxiety, Heart murmor   PAIN:  Are you having pain? Yes: NPRS scale: 0/10 Pain location: bilateral upper traps  Pain description: aching  Aggravating factors: driving Relieving factors: finding a comfrtable position   PRECAUTIONS: None  RED FLAGS: None     WEIGHT BEARING RESTRICTIONS: No  FALLS:  Has patient fallen in last 6 months? No  LIVING ENVIRONMENT: Nothing  OCCUPATION:  Not working   Hobbies:  Going to the gym     PLOF: Independent  PATIENT GOALS:  To develop a plan to prevent further exacerbation of pain   NEXT MD VISIT:    OBJECTIVE:   DIAGNOSTIC FINDINGS:  MRI: showed bulging disc at C6- and c7 PATIENT SURVEYS:  FOTO Not given 2nd to just here for HEP   COGNITION: Overall cognitive status: Within functional limits for tasks assessed  SENSATION: Had numbness into the middle 3 fingers on the left. On the right she has some numbness in her hand with use of the mouse  POSTURE: No Significant postural limitations  PALPATION:    CERVICAL ROM:   Active ROM A/PROM (deg) eval  Flexion Full  Extension Full  Right lateral flexion   Left lateral flexion   Right rotation Full   Left rotation Full     (Blank rows = not tested)  UPPER  EXTREMITY ROM:  Active ROM Right eval Left eval  Shoulder flexion Full without pain Full without pain  Shoulder extension    Shoulder abduction    Shoulder adduction    Shoulder extension    Shoulder internal rotation Full without pain Full without pain  Shoulder external rotation Full without pain Full without pain  Elbow flexion    Elbow extension    Wrist flexion    Wrist extension    Wrist ulnar deviation    Wrist radial deviation    Wrist pronation    Wrist supination     (Blank rows = not tested)  UPPER EXTREMITY MMT:  MMT Right eval Left eval  Shoulder flexion 5 5  Shoulder extension    Shoulder abduction 5 5  Shoulder adduction    Shoulder extension    Shoulder internal rotation    Shoulder external rotation    Middle trapezius    Lower trapezius    Elbow flexion    Elbow extension    Wrist flexion    Wrist extension    Wrist ulnar deviation    Wrist radial deviation    Wrist pronation    Wrist supination    Grip strength     (Blank rows = not tested)   TODAY'S TREATMENT:                                                                                                                              DATE:  9/25 Trigger Point Dry-Needling  Treatment instructions: Expect mild to moderate muscle soreness. S/S of pneumothorax if dry needled over a lung field, and to seek immediate medical attention should they occur. Patient verbalized understanding of these instructions and education.  Patient Consent Given: Yes Education handout provided: Yes Muscles treated: left upper trap 3x using a .30x50 needle  Electrical stimulation performed: No Parameters: N/A Treatment response/outcome: No   Dumbell flexion 2x10 3lbs  Dumbell press 2x10 3lbs  Dumbell scpation 3x10 3 lbs   Cable:  Row 15 lbs 2x15  Shoulder extension 2x15 15 lbs     PATIENT EDUCATION:  Education details: HEP, symptom management, POC Person educated: Patient Education method:  Explanation, Demonstration, Tactile cues, Verbal cues, and Handouts Education comprehension: verbalized understanding, returned demonstration, verbal cues required, tactile cues required, and needs further education  HOME EXERCISE PROGRAM: Access Code: [email or web address] URL: [email or web address] Date: 10/20/2022 Prepared by: Lorayne Bender  Exercises - Seated Cervical Rotation AROM  - 1 x daily - 7 x weekly - 3 sets - 10 reps - Theracane Over Shoulder  - 1 x daily - 7 x weekly - 3 sets - 10 reps - Seated Upper Trapezius Stretch  - 1 x daily - 7 x weekly - 3 sets - 10 reps - Gentle Levator Scapulae Stretch  - 1 x daily - 7 x weekly - 3 sets - 10 reps - Shoulder extension with resistance - Neutral  - 1 x daily - 7 x  weekly - 3 sets - 10 reps - Scapular Retraction with Resistance  - 1 x daily - 7 x weekly - 3 sets - 10 reps  ASSESSMENT:  CLINICAL IMPRESSION: Therapy performed trigger point dry needling to the upper trap. She had a great twitch. We reviewed exercises she can do to reduce post needle soreness. She plans on starting back at the gym. We also reviewed cable exercises. We will follow up in 2 weeks to look at her gym program and trouble shoot.    Eval:Patient is a 52 year old female with acute onset of cervical pain with left radicular pain into her left hand.  She had numbness and tingling into her middle 3 fingers.  Since her steroid shots the pain is resolved.  She feels like for the past 2 weeks she has had no pain and is back to full function with her left arm.  At this time she would like to get back into her gym activities.  She would also like a program of her further exacerbation in the future.  She was given base stretches and self soft tissue mobilization today and also given base exercises that will turn into her gym program.  She would benefit from 1-2 more visits to go through gym activities and work on concepts of prevent further exacerbations of pain. OBJECTIVE  IMPAIRMENTS: decreased ROM, decreased strength, increased muscle spasms, and pain.   ACTIVITY LIMITATIONS: carrying, lifting, sleeping, and reach over head with exacerbations   PARTICIPATION LIMITATIONS: driving  PERSONAL FACTORS: Lower back pain  are also affecting patient's functional outcome.   REHAB POTENTIAL: Good  CLINICAL DECISION MAKING: Stable/uncomplicated  EVALUATION COMPLEXITY: Low   GOALS: Goals reviewed with patient? No  STG=LTG  LONG TERM GOALS: Target date: 12/01/2022    Patient will return to gym without pain  Baseline:  Goal status: INITIAL  2.  Patient will have full program to prevent further exacerbation of pain  Baseline:  Goal status: INITIAL  3.  Patient will have no pain while driving  Baseline:  Goal status: INITIAL   PLAN:  PT FREQUENCY: 1x/week  PT DURATION: 6 weeks  PLANNED INTERVENTIONS: Therapeutic exercises, Therapeutic activity, Neuromuscular re-education, Balance training, Gait training, Patient/Family education, Self Care, Joint mobilization, Stair training, DME instructions, Aquatic Therapy, Dry Needling, Electrical stimulation, Cryotherapy, Moist heat, Taping, Manual therapy, and Re-evaluation.   PLAN FOR NEXT SESSION: Assess tolerance to new exercises.  Reviewed gym exercises.  Modified gym program as necessary to prevent further exacerbation of pain.  If patient is symptomatic consider trigger point dry needling manual therapy.   Dessie Coma, PT 11/18/2022, 1:07 PM

## 2022-11-28 ENCOUNTER — Ambulatory Visit
Admission: RE | Admit: 2022-11-28 | Discharge: 2022-11-28 | Disposition: A | Discharge status: Home / Self Care | Payer: Managed Care, Other (non HMO) | Source: Ambulatory Visit | Attending: Obstetrics and Gynecology | Admitting: Obstetrics and Gynecology

## 2022-11-28 DIAGNOSIS — Z9189 Other specified personal risk factors, not elsewhere classified: Secondary | ICD-10-CM

## 2022-11-28 MED ORDER — GADOPICLENOL 0.5 MMOL/ML IV SOLN
7.0000 mL | Freq: Once | INTRAVENOUS | Status: AC | PRN
  Administered 2022-11-28: 7 mL via INTRAVENOUS

## 2023-03-31 ENCOUNTER — Ambulatory Visit (INDEPENDENT_AMBULATORY_CARE_PROVIDER_SITE_OTHER): Payer: Managed Care, Other (non HMO)

## 2023-03-31 ENCOUNTER — Encounter: Payer: Self-pay | Admitting: Podiatry

## 2023-03-31 ENCOUNTER — Ambulatory Visit (INDEPENDENT_AMBULATORY_CARE_PROVIDER_SITE_OTHER): Payer: Managed Care, Other (non HMO) | Admitting: Podiatry

## 2023-03-31 DIAGNOSIS — M67472 Ganglion, left ankle and foot: Secondary | ICD-10-CM

## 2023-03-31 DIAGNOSIS — D169 Benign neoplasm of bone and articular cartilage, unspecified: Secondary | ICD-10-CM

## 2023-03-31 NOTE — Progress Notes (Signed)
 Subjective:   Patient ID: Joanna Yang, female   DOB: 53 y.o.   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  May require resection at 1 point in future and I did go ahead I debrided tissue bilateral no iatrogenic bleeding courtesy discussed excess ptotic lesions possibility for surgery in future  X-rays indicate there is some soft tissue density around the left big toe no other pathology

## 2023-05-13 ENCOUNTER — Encounter: Payer: Self-pay | Admitting: Podiatry

## 2023-05-13 ENCOUNTER — Ambulatory Visit (INDEPENDENT_AMBULATORY_CARE_PROVIDER_SITE_OTHER): Admitting: Podiatry

## 2023-05-13 DIAGNOSIS — M67472 Ganglion, left ankle and foot: Secondary | ICD-10-CM | POA: Diagnosis not present

## 2023-05-13 DIAGNOSIS — D169 Benign neoplasm of bone and articular cartilage, unspecified: Secondary | ICD-10-CM

## 2023-05-14 NOTE — Progress Notes (Signed)
 Subjective:   Patient ID: Joanna Yang, female   DOB: 54 y.o.   MRN: [id number]   HPI Patient presents stating that she has a cyst on the left big toe at the inner phalangeal joint and what I did last time seem to eliminate one of them but there is a second 1 that is bothersome   ROS      Objective:  Physical Exam  Neurovascular status with a prominence at the medial inner phalangeal joint with a small fluid-filled lesion that is too small to be able to drain but I do feel like we could try to shrink     Assessment:  Ganglionic cyst formation mucoid in nature with the possibility of an underlying bone spur     Plan:  H&P reviewed I did go ahead today I anesthetized the area I then injected it with a small amount of Xylocaine and Kenalog to try to shrink it and I discussed that this does not solve the problem then excision with possible smoothing of bone might be necessary.  Applied compression to try to shrink the area and patient will be seen back as needed

## 2023-08-04 ENCOUNTER — Other Ambulatory Visit (HOSPITAL_BASED_OUTPATIENT_CLINIC_OR_DEPARTMENT_OTHER): Payer: Self-pay

## 2023-08-04 MED ORDER — CAPVAXIVE 0.5 ML IM SOSY
0.5000 mL | PREFILLED_SYRINGE | Freq: Once | INTRAMUSCULAR | 0 refills | Status: AC
  Filled 2023-08-04: qty 0.5, 1d supply, fill #0

## 2023-08-23 ENCOUNTER — Other Ambulatory Visit: Payer: Self-pay | Admitting: Neurosurgery

## 2023-08-23 DIAGNOSIS — R413 Other amnesia: Secondary | ICD-10-CM

## 2023-08-23 DIAGNOSIS — R2689 Other abnormalities of gait and mobility: Secondary | ICD-10-CM

## 2023-08-23 DIAGNOSIS — R4189 Other symptoms and signs involving cognitive functions and awareness: Secondary | ICD-10-CM

## 2023-08-23 DIAGNOSIS — R519 Headache, unspecified: Secondary | ICD-10-CM

## 2023-09-04 ENCOUNTER — Inpatient Hospital Stay
Admission: RE | Admit: 2023-09-04 | Discharge: 2023-09-04 | Discharge status: Home / Self Care | Source: Ambulatory Visit | Attending: Neurosurgery | Admitting: Neurosurgery

## 2023-09-04 DIAGNOSIS — R2689 Other abnormalities of gait and mobility: Secondary | ICD-10-CM

## 2023-09-04 DIAGNOSIS — R4189 Other symptoms and signs involving cognitive functions and awareness: Secondary | ICD-10-CM

## 2023-09-04 DIAGNOSIS — R519 Headache, unspecified: Secondary | ICD-10-CM

## 2023-09-04 DIAGNOSIS — R413 Other amnesia: Secondary | ICD-10-CM

## 2023-09-08 ENCOUNTER — Other Ambulatory Visit

## 2023-09-16 ENCOUNTER — Other Ambulatory Visit: Payer: Self-pay | Admitting: Obstetrics and Gynecology

## 2023-09-16 DIAGNOSIS — Z1231 Encounter for screening mammogram for malignant neoplasm of breast: Secondary | ICD-10-CM

## 2023-09-27 ENCOUNTER — Ambulatory Visit (HOSPITAL_COMMUNITY)
Admission: RE | Admit: 2023-09-27 | Discharge: 2023-09-27 | Disposition: A | Discharge status: Home / Self Care | Payer: Managed Care, Other (non HMO) | Source: Ambulatory Visit | Attending: Cardiology | Admitting: Cardiology

## 2023-09-27 ENCOUNTER — Ambulatory Visit: Payer: Self-pay | Admitting: Physician Assistant

## 2023-09-27 DIAGNOSIS — R072 Precordial pain: Secondary | ICD-10-CM | POA: Insufficient documentation

## 2023-09-27 DIAGNOSIS — I358 Other nonrheumatic aortic valve disorders: Secondary | ICD-10-CM

## 2023-09-27 LAB — ECHOCARDIOGRAM COMPLETE
Area-P 1/2: 4.37 cm2
S' Lateral: 2.6 cm

## 2023-09-28 ENCOUNTER — Ambulatory Visit

## 2023-10-05 ENCOUNTER — Ambulatory Visit
Admission: RE | Admit: 2023-10-05 | Discharge: 2023-10-05 | Disposition: A | Discharge status: Home / Self Care | Source: Ambulatory Visit | Attending: Obstetrics and Gynecology | Admitting: Obstetrics and Gynecology

## 2023-10-05 DIAGNOSIS — Z1231 Encounter for screening mammogram for malignant neoplasm of breast: Secondary | ICD-10-CM

## 2023-10-07 ENCOUNTER — Ambulatory Visit: Payer: Self-pay | Admitting: Obstetrics and Gynecology

## 2023-11-07 ENCOUNTER — Other Ambulatory Visit: Payer: Self-pay | Admitting: Physician Assistant

## 2023-11-11 ENCOUNTER — Other Ambulatory Visit: Payer: Self-pay | Admitting: Physician Assistant

## 2023-11-26 ENCOUNTER — Other Ambulatory Visit (HOSPITAL_COMMUNITY): Payer: Self-pay

## 2023-11-26 ENCOUNTER — Other Ambulatory Visit: Payer: Self-pay

## 2023-11-26 MED ORDER — ROSUVASTATIN CALCIUM 5 MG PO TABS
5.0000 mg | ORAL_TABLET | Freq: Every day | ORAL | 0 refills | Status: DC
  Filled 2023-11-26: qty 30, 30d supply, fill #0

## 2023-12-24 ENCOUNTER — Ambulatory Visit: Admitting: Physician Assistant

## 2024-01-07 ENCOUNTER — Telehealth: Payer: Self-pay | Admitting: Internal Medicine

## 2024-01-07 MED ORDER — ROSUVASTATIN CALCIUM 5 MG PO TABS: 5.0000 mg | ORAL_TABLET | Freq: Every day | ORAL | 0 refills | Status: DC

## 2024-01-07 NOTE — Telephone Encounter (Signed)
*  STAT* If patient is at the pharmacy, call can be transferred to refill team.   1. Which medications need to be refilled? (please list name of each medication and dose if known)   rosuvastatin  (CRESTOR ) 5 MG tablet    2. Which pharmacy/location (including street and city if local pharmacy) is medication to be sent to? [id number] - [place], [place] - [address] Phone: [phone]  Fax: [phone]      3. Do they need a 30 day or 90 day supply? Pt made appt for 01/28/24 please refill til then. Pt is out of medication

## 2024-01-07 NOTE — Telephone Encounter (Signed)
 Prescription sent

## 2024-01-28 ENCOUNTER — Ambulatory Visit: Admitting: Physician Assistant

## 2024-03-10 ENCOUNTER — Encounter: Payer: Self-pay | Admitting: Physician Assistant

## 2024-03-10 ENCOUNTER — Other Ambulatory Visit: Payer: Self-pay

## 2024-03-10 ENCOUNTER — Ambulatory Visit: Payer: Self-pay | Attending: Physician Assistant | Admitting: Physician Assistant

## 2024-03-10 VITALS — BP 100/72 | HR 89 | Ht 66.0 in | Wt 175.0 lb

## 2024-03-10 DIAGNOSIS — R072 Precordial pain: Secondary | ICD-10-CM | POA: Diagnosis not present

## 2024-03-10 DIAGNOSIS — E785 Hyperlipidemia, unspecified: Secondary | ICD-10-CM

## 2024-03-10 DIAGNOSIS — R002 Palpitations: Secondary | ICD-10-CM

## 2024-03-10 DIAGNOSIS — G4733 Obstructive sleep apnea (adult) (pediatric): Secondary | ICD-10-CM

## 2024-03-10 MED ORDER — ROSUVASTATIN CALCIUM 5 MG PO TABS: 5.0000 mg | ORAL_TABLET | Freq: Every day | ORAL | 1 refills | Status: AC

## 2024-03-10 NOTE — Progress Notes (Signed)
 " Cardiology Office Note:  .   Date:  03/10/2024  ID:  Joanna Yang, DOB 1970/09/27, MRN [id number] PCP: Sophronia Ozell BROCKS, MD  [facility] Providers Cardiologist:  Dr. Santo   History of Present Illness: .   Joanna Yang is a 54 y.o. female with past medical history of panic attacks and atypical chest pain here for follow-up appointment.  ETT 01/2016 inconclusive with upsloping of 1 mm ST depression resolved 1 minute into recovery, stress echo 02/2016 with sinus tach 2 to 3 mm horizontal to upsloping ST depression at peak stress, normal LVEF recovery and peak stress.  Went to [facility] for second opinion.  Was very anxious about her health at that time.  Multiple mammograms in the same year because she felt lumps.  Symptoms of sharp chest pain only began a few weeks prior to evaluation after initial abnormal ETT where she became worried about a heart attack.  Essentially no risk for CAD and never had chest pain with exertion in the past.  Current symptoms atypical and unlikely to be cardiac in origin.  Sounds as though she had a panic attack 03/12/2016 when she called 911.  We did offer a coronary CTA January 2018 which she declined due to concerns about radiation.  Had hysterectomy in 10/21.  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  Sister with breast cancer.  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  She saw Dr. Jackolyn 03/19/2020 and had atypical chest pain and mild DOE.  Echo ordered for HOCM screening based on her sister.  Echo 2/22 was normal.  In the ED with panic attack and chest pain, CT 12/18/2020 with no PE, 4 mm left lower lobe nodule.  She was last seen April 2023 and wanted to talk about ED visit 12/18/2020 and upcoming surgery.  Patient says D-dimer was hide urgent care before she went to ED which was not seen on our end.  She is on rosuvastatin  for cholesterol by PCP.  Walks some but no consistent  exercise.  Joint [employer] and doing light weights.  No regular chest pain other than anxiety.  No exertional chest pain.  Asking to have coronary CTA that was offered in 2018.  LDL 99 down from 146.  Patient very anxious.  I saw her 08/26/2022, she tells me that she never had her coronary CTA because she was worried about radiation.  She was started on Crestor  for an LDL of 140.  Now LDL is at goal however, patient is worried about statins and side effects.  We discussed decreasing to 5 mg and repeating a lipid panel in 8 weeks.  She has been experiencing left arm pain for the last 2 weeks that goes down into her fingers and some numbness.  She does have some issues with her neck and shoulder that might be the culprit but since she has a strong family history of coronary disease we discussed a few different tests today.  Offered exercise stress test but ultimately decided on coronary CTA.  She shares that her mother passed away from atrial fibrillation and heart failure and her sister who is 48 years old has HCM.  We also discussed updating a echocardiogram.  Otherwise, patient is doing well today.  She has a monitor on since she was experiencing palpitations.  She was last seen by me 09/2022, she is doing well.  Unfortunately, going through menopause and has noticed some central  weight gain.  We reviewed her most recent testing including her coronary CTA, echocardiogram, and ZIO monitor.  We discussed updating a lipid panel and follow-up with PCP and GYN.  Will plan to follow her with an echocardiogram once a year to ensure stability.  (Sister with recent diagnosis of HCM).  Reports no shortness of breath nor dyspnea on exertion. Reports no chest pain, pressure, or tightness. No edema, orthopnea, PND. Reports no palpitations.   Today, she tells me that she still is dealing with some weight gain.  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  We reviewed her coronary CTA with a calcium  score of 0.   This is reassuring.  We also reviewed her most recent lab work which was from a year and a half ago which showed an at goal LDL.  However, she has been intermittently taking her Crestor .  Did suggest that she takes the 5 mg daily and we recheck some labs when she is fasted.  She still endorses a sharp pain mostly on the right side of her chest but this is not associated with exercise and is intermittent.  Very atypical for angina and low suspicion for cardiac in origin due to recent studies.  We reviewed her echocardiogram and she has a normal left ventricle and a normal septum thickness but her sister who lives in [place] was recently diagnosed with HCM so we have referred her on for genetic testing due to this.  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  Trying to get back into the gym to lose a few of the pounds she has gained over the last couple of months with her undergoing surgery.  Reports no shortness of breath nor dyspnea on exertion. No edema, orthopnea, PND. Reports no palpitations.    ROS: Pertinent ROS in HPI  Studies Reviewed: .       Echo 09/27/23 IMPRESSIONS     1. Left ventricular ejection fraction, by estimation, is 65 to 70%. Left  ventricular ejection fraction by 3D volume is 68 %. The left ventricle has  normal function. The left ventricle has no regional wall motion  abnormalities. Left ventricular diastolic   parameters are indeterminate. The average left ventricular global  longitudinal strain is -28.2 %. The global longitudinal strain is normal.   2. Right ventricular systolic function is normal. The right ventricular  size is normal.   3. The mitral valve is normal in structure. Trivial mitral valve  regurgitation. No evidence of mitral stenosis.   4. The aortic valve is tricuspid. Aortic valve regurgitation is not  visualized. Aortic valve sclerosis/calcification is present, without any  evidence of aortic stenosis.    5. The inferior vena cava is normal in size with greater than 50%  respiratory variability, suggesting right atrial pressure of 3 mmHg.     Echo 04/11/20 IMPRESSIONS     1. Left ventricular ejection fraction, by estimation, is 65 to 70%. The  left ventricle has normal function. The left ventricle has no regional  wall motion abnormalities. Left ventricular diastolic parameters are  indeterminate.   2. Right ventricular systolic function is normal. The right ventricular  size is normal.   3. The mitral valve is normal in structure. No evidence of mitral valve  regurgitation.   4. The aortic valve is normal in structure. Aortic valve regurgitation is  not visualized.   FINDINGS   Left Ventricle: Left ventricular ejection fraction,  by estimation, is 65  to 70%. The left ventricle has normal function. The left ventricle has no  regional wall motion abnormalities. The left ventricular internal cavity  size was normal in size. There is   no left ventricular hypertrophy. Left ventricular diastolic parameters  are indeterminate.   Right Ventricle: The right ventricular size is normal. Right vetricular  wall thickness was not assessed. Right ventricular systolic function is  normal.   Left Atrium: Left atrial size was normal in size.   Right Atrium: Right atrial size was normal in size.   Pericardium: There is no evidence of pericardial effusion.   Mitral Valve: The mitral valve is normal in structure. No evidence of  mitral valve regurgitation.   Tricuspid Valve: The tricuspid valve is grossly normal. Tricuspid valve  regurgitation is trivial.   Aortic Valve: The aortic valve is normal in structure. Aortic valve  regurgitation is not visualized.   Pulmonic Valve: The pulmonic valve was not well visualized. Pulmonic valve  regurgitation is not visualized.   Aorta: The aortic root and ascending aorta are structurally normal, with  no evidence of dilitation.   IAS/Shunts:  No atrial level shunt detected by color flow Doppler.        Physical Exam:   VS:  BP 100/72   Pulse 89   Ht 5' 6 (1.676 m)   Wt 175 lb (79.4 kg)   LMP 07/28/2019   SpO2 97%   BMI 28.25 kg/m    Wt Readings from Last 3 Encounters:  03/10/24 175 lb (79.4 kg)  10/20/22 168 lb (76.2 kg)  09/29/22 168 lb 6.4 oz (76.4 kg)    GEN: Well nourished, well developed in no acute distress NECK: No JVD; No carotid bruits CARDIAC: RRR, no murmurs, rubs, gallops RESPIRATORY:  Clear to auscultation without rales, wheezing or rhonchi  ABDOMEN: Soft, non-tender, non-distended EXTREMITIES:  No edema; No deformity   ASSESSMENT AND PLAN: .    1.  Anxiety -She is on clonazepam  and Prozac   2.  Precordial chest pain -right sided and not with activity -coronary CTA neg with calcium  score of 0 -LDL goal < 70 for preventive progression -labs today CMP, CBC, VIT D AND B12, FERRITIN, and Thyroid  panel  3.  Hyperlipidemia -Continue Crestor , decreased to 5 mg daily -Will repeat a lipid panel  4. Palpitations -No longer an issue  5. Sleep apnea and mouth piece  -She has been compliant with her CPAP device      Dispo: He can follow-up in 6 months with Dr. Santo  Signed, Orren LOISE Fabry, PA-C   "

## 2024-03-10 NOTE — Patient Instructions (Signed)
 Medication Instructions:  CONTINUE taking Crestor  (rosuvastatin ) 5mg  Take 1 tablet once a day *If you need a refill on your cardiac medications before your next appointment, please call your pharmacy*  Lab Work: COMPLETE AT YOUR CONVENIENCE-A1C, VIT B12, VITD, CMET, CBC, LIPID, TSH, FREE T4, T3 If you have labs (blood work) drawn today and your tests are completely normal, you will receive your results only by: MyChart Message (if you have MyChart) OR A paper copy in the mail If you have any lab test that is abnormal or we need to change your treatment, we will call you to review the results.  Testing/Procedures: NONE ORDERED  Follow-Up: At [facility], you and your health needs are our priority.  As part of our continuing mission to provide you with exceptional heart care, our providers are all part of one team.  This team includes your primary Cardiologist (physician) and Advanced Practice Providers or APPs (Physician Assistants and Nurse Practitioners) who all work together to provide you with the care you need, when you need it.  Your next appointment:   6 month(s)  Provider:   Stanly DELENA Leavens, MD    We recommend signing up for the patient portal called MyChart.  Sign up information is provided on this After Visit Summary.  MyChart is used to connect with patients for Virtual Visits (Telemedicine).  Patients are able to view lab/test results, encounter notes, upcoming appointments, etc.  Non-urgent messages can be sent to your provider as well.   To learn more about what you can do with MyChart, go to [email or web address].   Other Instructions

## 2024-03-15 LAB — LIPID PANEL

## 2024-03-15 LAB — CBC

## 2024-03-16 LAB — CBC
Hematocrit: 47.9 % — AB (ref 34.0–46.6)
Hemoglobin: 14.9 g/dL (ref 11.1–15.9)
MCH: 28.1 pg (ref 26.6–33.0)
MCHC: 31.1 g/dL — AB (ref 31.5–35.7)
MCV: 90 fL (ref 79–97)
Platelets: 249 x10E3/uL (ref 150–450)
RBC: 5.31 x10E6/uL — AB (ref 3.77–5.28)
RDW: 12.2 % (ref 11.7–15.4)
WBC: 4.4 x10E3/uL (ref 3.4–10.8)

## 2024-03-16 LAB — VITAMIN D 25 HYDROXY (VIT D DEFICIENCY, FRACTURES): Vit D, 25-Hydroxy: 22.8 ng/mL — AB (ref 30.0–100.0)

## 2024-03-16 LAB — TSH+T4F+T3FREE
Free T4: 0.91 ng/dL (ref 0.82–1.77)
T3, Free: 2.8 pg/mL (ref 2.0–4.4)
TSH: 1.95 u[IU]/mL (ref 0.450–4.500)

## 2024-03-16 LAB — COMPREHENSIVE METABOLIC PANEL WITH GFR
ALT: 27 IU/L (ref 0–32)
AST: 21 IU/L (ref 0–40)
Albumin: 4.4 g/dL (ref 3.8–4.9)
Alkaline Phosphatase: 70 IU/L (ref 49–135)
BUN/Creatinine Ratio: 14 (ref 9–23)
BUN: 13 mg/dL (ref 6–24)
Bilirubin Total: 0.5 mg/dL (ref 0.0–1.2)
CO2: 23 mmol/L (ref 20–29)
Calcium: 9.7 mg/dL (ref 8.7–10.2)
Chloride: 104 mmol/L (ref 96–106)
Creatinine, Ser: 0.91 mg/dL (ref 0.57–1.00)
Globulin, Total: 2.4 g/dL (ref 1.5–4.5)
Glucose: 89 mg/dL (ref 70–99)
Potassium: 4.6 mmol/L (ref 3.5–5.2)
Sodium: 143 mmol/L (ref 134–144)
Total Protein: 6.8 g/dL (ref 6.0–8.5)
eGFR: 76 mL/min/1.73

## 2024-03-16 LAB — HEMOGLOBIN A1C
Est. average glucose Bld gHb Est-mCnc: 108 mg/dL
Hgb A1c MFr Bld: 5.4 % (ref 4.8–5.6)

## 2024-03-16 LAB — LIPID PANEL
Cholesterol, Total: 242 mg/dL — AB (ref 100–199)
HDL: 52 mg/dL
LDL CALC COMMENT:: 4.7 ratio — AB (ref 0.0–4.4)
LDL Chol Calc (NIH): 146 mg/dL — AB (ref 0–99)
Triglycerides: 241 mg/dL — AB (ref 0–149)
VLDL Cholesterol Cal: 44 mg/dL — AB (ref 5–40)

## 2024-03-16 LAB — VITAMIN B12: Vitamin B-12: 574 pg/mL (ref 232–1245)

## 2024-03-16 LAB — FERRITIN: Ferritin: 244 ng/mL — AB (ref 15–150)

## 2024-03-18 ENCOUNTER — Ambulatory Visit: Payer: Self-pay | Admitting: Physician Assistant

## 2024-03-18 DIAGNOSIS — E785 Hyperlipidemia, unspecified: Secondary | ICD-10-CM

## 2024-03-23 NOTE — Addendum Note (Signed)
 Addended by: RUBBIE KERRI HERO on: 03/23/2024 03:47 PM   Modules accepted: Orders

## 2024-03-23 NOTE — Telephone Encounter (Signed)
-----   Message from Orren LOISE Fabry sent at 03/18/2024  4:28 PM EST ----- Ms. Mcghee,  I will have your PCP address the ferritin and vitD ect but your LDL or bad cholesterol is higher than we want. Goal for you is < 70. I would suggest increasing your crestor .  Triage, please send a copy to PCP.  Thanks! Orren LOISE Fabry, PA-C

## 2024-03-23 NOTE — Telephone Encounter (Signed)
 Attempted to Lvm  but is full

## 2024-03-23 NOTE — Progress Notes (Signed)
 Sent My chart message and orders put in

## 2024-04-18 ENCOUNTER — Ambulatory Visit: Admitting: Obstetrics and Gynecology
